# Patient Record
Sex: Female | Born: 1945 | Race: White | Hispanic: No | Marital: Married | Smoking: Current some day smoker
Health system: Southern US, Community
[De-identification: ages and names within clinical notes are randomized; demographics above are authoritative.]

## PROBLEM LIST (undated history)

## (undated) DIAGNOSIS — F17200 Nicotine dependence, unspecified, uncomplicated: Secondary | ICD-10-CM

## (undated) DIAGNOSIS — M1710 Unilateral primary osteoarthritis, unspecified knee: Secondary | ICD-10-CM

## (undated) DIAGNOSIS — N289 Disorder of kidney and ureter, unspecified: Secondary | ICD-10-CM

## (undated) DIAGNOSIS — R7302 Impaired glucose tolerance (oral): Secondary | ICD-10-CM

## (undated) DIAGNOSIS — N318 Other neuromuscular dysfunction of bladder: Secondary | ICD-10-CM

## (undated) DIAGNOSIS — J309 Allergic rhinitis, unspecified: Secondary | ICD-10-CM

## (undated) DIAGNOSIS — E559 Vitamin D deficiency, unspecified: Secondary | ICD-10-CM

## (undated) DIAGNOSIS — E785 Hyperlipidemia, unspecified: Secondary | ICD-10-CM

## (undated) DIAGNOSIS — F329 Major depressive disorder, single episode, unspecified: Secondary | ICD-10-CM

## (undated) DIAGNOSIS — J45909 Unspecified asthma, uncomplicated: Secondary | ICD-10-CM

## (undated) DIAGNOSIS — F411 Generalized anxiety disorder: Secondary | ICD-10-CM

## (undated) DIAGNOSIS — I10 Essential (primary) hypertension: Secondary | ICD-10-CM

## (undated) HISTORY — DX: Unspecified asthma, uncomplicated: J45.909

## (undated) HISTORY — DX: Impaired glucose tolerance (oral): R73.02

## (undated) HISTORY — DX: Nicotine dependence, unspecified, uncomplicated: F17.200

## (undated) HISTORY — DX: Allergic rhinitis, unspecified: J30.9

## (undated) HISTORY — PX: CATARACT EXTRACTION: SUR2

## (undated) HISTORY — DX: Essential (primary) hypertension: I10

## (undated) HISTORY — DX: Vitamin D deficiency, unspecified: E55.9

## (undated) HISTORY — PX: CHOLECYSTECTOMY: SHX55

## (undated) HISTORY — DX: Generalized anxiety disorder: F41.1

## (undated) HISTORY — DX: Unilateral primary osteoarthritis, unspecified knee: M17.10

## (undated) HISTORY — PX: OOPHORECTOMY: SHX86

## (undated) HISTORY — DX: Hyperlipidemia, unspecified: E78.5

## (undated) HISTORY — PX: TONSILLECTOMY: SUR1361

## (undated) HISTORY — DX: Other neuromuscular dysfunction of bladder: N31.8

## (undated) HISTORY — DX: Major depressive disorder, single episode, unspecified: F32.9

## (undated) HISTORY — PX: ABDOMINAL HYSTERECTOMY: SHX81

---

## 1970-08-09 HISTORY — PX: BREAST BIOPSY: SHX20

## 2003-08-19 ENCOUNTER — Other Ambulatory Visit: Admission: RE | Admit: 2003-08-19 | Discharge: 2003-08-19 | Payer: Self-pay | Admitting: *Deleted

## 2004-05-11 ENCOUNTER — Encounter: Admission: RE | Admit: 2004-05-11 | Discharge: 2004-05-11 | Payer: Self-pay | Admitting: Sports Medicine

## 2004-05-25 ENCOUNTER — Encounter: Admission: RE | Admit: 2004-05-25 | Discharge: 2004-05-25 | Payer: Self-pay | Admitting: Sports Medicine

## 2004-06-17 ENCOUNTER — Encounter: Admission: RE | Admit: 2004-06-17 | Discharge: 2004-06-17 | Payer: Self-pay | Admitting: Sports Medicine

## 2004-07-24 ENCOUNTER — Ambulatory Visit: Payer: Self-pay

## 2004-07-24 ENCOUNTER — Ambulatory Visit: Payer: Self-pay | Admitting: Internal Medicine

## 2005-03-02 ENCOUNTER — Ambulatory Visit: Payer: Self-pay | Admitting: Internal Medicine

## 2005-04-01 ENCOUNTER — Ambulatory Visit: Payer: Self-pay | Admitting: Internal Medicine

## 2005-04-13 ENCOUNTER — Encounter: Admission: RE | Admit: 2005-04-13 | Discharge: 2005-04-13 | Payer: Self-pay | Admitting: Internal Medicine

## 2005-08-19 ENCOUNTER — Encounter: Admission: RE | Admit: 2005-08-19 | Discharge: 2005-08-19 | Payer: Self-pay | Admitting: Sports Medicine

## 2005-09-03 ENCOUNTER — Encounter: Admission: RE | Admit: 2005-09-03 | Discharge: 2005-09-03 | Payer: Self-pay | Admitting: Sports Medicine

## 2006-06-07 ENCOUNTER — Encounter: Admission: RE | Admit: 2006-06-07 | Discharge: 2006-06-07 | Payer: Self-pay | Admitting: Internal Medicine

## 2006-09-27 ENCOUNTER — Ambulatory Visit: Payer: Self-pay | Admitting: Internal Medicine

## 2006-09-27 LAB — CONVERTED CEMR LAB
ALT: 23 units/L (ref 0–40)
AST: 38 units/L — ABNORMAL HIGH (ref 0–37)
Albumin: 3.8 g/dL (ref 3.5–5.2)
Alkaline Phosphatase: 64 units/L (ref 39–117)
BUN: 13 mg/dL (ref 6–23)
Basophils Absolute: 0.1 10*3/uL (ref 0.0–0.1)
Basophils Relative: 0.6 % (ref 0.0–1.0)
Bilirubin Urine: NEGATIVE
Bilirubin, Direct: 0.6 mg/dL — ABNORMAL HIGH (ref 0.0–0.3)
CO2: 28 meq/L (ref 19–32)
Calcium: 9.4 mg/dL (ref 8.4–10.5)
Chloride: 102 meq/L (ref 96–112)
Cholesterol: 177 mg/dL (ref 0–200)
Creatinine, Ser: 0.8 mg/dL (ref 0.4–1.2)
Eosinophils Absolute: 0.4 10*3/uL (ref 0.0–0.6)
Eosinophils Relative: 3.7 % (ref 0.0–5.0)
GFR calc Af Amer: 93 mL/min
GFR calc non Af Amer: 77 mL/min
Glucose, Bld: 109 mg/dL — ABNORMAL HIGH (ref 70–99)
HCT: 40.4 % (ref 36.0–46.0)
HDL: 54.5 mg/dL (ref >39.0)
Hemoglobin, Urine: NEGATIVE
Hemoglobin: 13.8 g/dL (ref 12.0–15.0)
Ketones, ur: NEGATIVE mg/dL
LDL Cholesterol: 90 mg/dL (ref 0–99)
Leukocytes, UA: NEGATIVE
Lymphocytes Relative: 31.8 % (ref 12.0–46.0)
MCHC: 34.2 g/dL (ref 30.0–36.0)
MCV: 90.7 fL (ref 78.0–100.0)
Monocytes Absolute: 0.7 10*3/uL (ref 0.2–0.7)
Monocytes Relative: 6.9 % (ref 3.0–11.0)
Neutro Abs: 5.3 10*3/uL (ref 1.4–7.7)
Neutrophils Relative %: 57 % (ref 43.0–77.0)
Nitrite: NEGATIVE
Platelets: 183 10*3/uL (ref 150–400)
Potassium: 4.6 meq/L (ref 3.5–5.1)
RBC: 4.46 M/uL (ref 3.87–5.11)
RDW: 12.4 % (ref 11.5–14.6)
Sodium: 138 meq/L (ref 135–145)
Specific Gravity, Urine: >=1.03 (ref 1.000–1.03)
TSH: 1.31 microintl units/mL (ref 0.35–5.50)
Total Bilirubin: 1.6 mg/dL — ABNORMAL HIGH (ref 0.3–1.2)
Total CHOL/HDL Ratio: 3.2
Total Protein, Urine: NEGATIVE mg/dL
Total Protein: 6.6 g/dL (ref 6.0–8.3)
Triglycerides: 165 mg/dL — ABNORMAL HIGH (ref 0–149)
Urine Glucose: NEGATIVE mg/dL
Urobilinogen, UA: 0.2 (ref 0.0–1.0)
VLDL: 33 mg/dL (ref 0–40)
WBC: 9.5 10*3/uL (ref 4.5–10.5)
pH: 6 (ref 5.0–8.0)

## 2006-10-05 ENCOUNTER — Ambulatory Visit: Payer: Self-pay | Admitting: Internal Medicine

## 2006-10-05 ENCOUNTER — Ambulatory Visit: Payer: Self-pay | Admitting: Family Medicine

## 2007-06-27 ENCOUNTER — Encounter: Admission: RE | Admit: 2007-06-27 | Discharge: 2007-06-27 | Payer: Self-pay | Admitting: Internal Medicine

## 2007-09-12 ENCOUNTER — Ambulatory Visit: Payer: Self-pay | Admitting: Internal Medicine

## 2007-09-12 LAB — CONVERTED CEMR LAB
ALT: 27 units/L (ref 0–35)
AST: 24 units/L (ref 0–37)
Albumin: 3.9 g/dL (ref 3.5–5.2)
Alkaline Phosphatase: 69 units/L (ref 39–117)
BUN: 16 mg/dL (ref 6–23)
Basophils Absolute: 0.1 10*3/uL (ref 0.0–0.1)
Basophils Relative: 0.6 % (ref 0.0–1.0)
Bilirubin Urine: NEGATIVE
Bilirubin, Direct: 0.2 mg/dL (ref 0.0–0.3)
CO2: 28 meq/L (ref 19–32)
Calcium: 9.4 mg/dL (ref 8.4–10.5)
Chloride: 102 meq/L (ref 96–112)
Cholesterol: 166 mg/dL (ref 0–200)
Creatinine, Ser: 1 mg/dL (ref 0.4–1.2)
Eosinophils Absolute: 0.4 10*3/uL (ref 0.0–0.6)
Eosinophils Relative: 4 % (ref 0.0–5.0)
GFR calc Af Amer: 72 mL/min
GFR calc non Af Amer: 60 mL/min
Glucose, Bld: 105 mg/dL — ABNORMAL HIGH (ref 70–99)
HCT: 40.6 % (ref 36.0–46.0)
HDL: 59 mg/dL (ref >39.0)
Hemoglobin, Urine: NEGATIVE
Hemoglobin: 13.9 g/dL (ref 12.0–15.0)
Ketones, ur: NEGATIVE mg/dL
LDL Cholesterol: 81 mg/dL (ref 0–99)
Leukocytes, UA: NEGATIVE
Lymphocytes Relative: 36.1 % (ref 12.0–46.0)
MCHC: 34.2 g/dL (ref 30.0–36.0)
MCV: 90.1 fL (ref 78.0–100.0)
Monocytes Absolute: 0.6 10*3/uL (ref 0.2–0.7)
Monocytes Relative: 6.1 % (ref 3.0–11.0)
Neutro Abs: 5.6 10*3/uL (ref 1.4–7.7)
Neutrophils Relative %: 53.2 % (ref 43.0–77.0)
Nitrite: NEGATIVE
Platelets: 133 10*3/uL — ABNORMAL LOW (ref 150–400)
Potassium: 3.9 meq/L (ref 3.5–5.1)
RBC: 4.51 M/uL (ref 3.87–5.11)
RDW: 12.4 % (ref 11.5–14.6)
Sodium: 139 meq/L (ref 135–145)
Specific Gravity, Urine: 1.02 (ref 1.000–1.03)
TSH: 1.16 microintl units/mL (ref 0.35–5.50)
Total Bilirubin: 1.1 mg/dL (ref 0.3–1.2)
Total CHOL/HDL Ratio: 2.8
Total Protein, Urine: NEGATIVE mg/dL
Total Protein: 6.8 g/dL (ref 6.0–8.3)
Triglycerides: 128 mg/dL (ref 0–149)
Urine Glucose: NEGATIVE mg/dL
Urobilinogen, UA: 0.2 (ref 0.0–1.0)
VLDL: 26 mg/dL (ref 0–40)
WBC: 10.5 10*3/uL (ref 4.5–10.5)
pH: 5.5 (ref 5.0–8.0)

## 2007-09-22 ENCOUNTER — Ambulatory Visit: Payer: Self-pay | Admitting: Internal Medicine

## 2007-09-22 DIAGNOSIS — H6500 Acute serous otitis media, unspecified ear: Secondary | ICD-10-CM | POA: Insufficient documentation

## 2007-09-22 DIAGNOSIS — J309 Allergic rhinitis, unspecified: Secondary | ICD-10-CM

## 2007-09-22 DIAGNOSIS — F3289 Other specified depressive episodes: Secondary | ICD-10-CM

## 2007-09-22 DIAGNOSIS — I10 Essential (primary) hypertension: Secondary | ICD-10-CM

## 2007-09-22 DIAGNOSIS — N318 Other neuromuscular dysfunction of bladder: Secondary | ICD-10-CM | POA: Insufficient documentation

## 2007-09-22 DIAGNOSIS — E785 Hyperlipidemia, unspecified: Secondary | ICD-10-CM

## 2007-09-22 DIAGNOSIS — F411 Generalized anxiety disorder: Secondary | ICD-10-CM

## 2007-09-22 DIAGNOSIS — F329 Major depressive disorder, single episode, unspecified: Secondary | ICD-10-CM

## 2007-09-22 HISTORY — DX: Generalized anxiety disorder: F41.1

## 2007-09-22 HISTORY — DX: Allergic rhinitis, unspecified: J30.9

## 2007-09-22 HISTORY — DX: Major depressive disorder, single episode, unspecified: F32.9

## 2007-09-22 HISTORY — DX: Other specified depressive episodes: F32.89

## 2007-09-22 HISTORY — DX: Other neuromuscular dysfunction of bladder: N31.8

## 2007-09-22 HISTORY — DX: Hyperlipidemia, unspecified: E78.5

## 2007-09-22 HISTORY — DX: Essential (primary) hypertension: I10

## 2008-02-13 ENCOUNTER — Encounter: Payer: Self-pay | Admitting: Internal Medicine

## 2008-04-30 ENCOUNTER — Telehealth (INDEPENDENT_AMBULATORY_CARE_PROVIDER_SITE_OTHER): Payer: Self-pay | Admitting: *Deleted

## 2008-08-16 ENCOUNTER — Encounter: Admission: RE | Admit: 2008-08-16 | Discharge: 2008-08-16 | Payer: Self-pay | Admitting: Internal Medicine

## 2008-10-10 ENCOUNTER — Ambulatory Visit: Payer: Self-pay | Admitting: Endocrinology

## 2008-11-08 ENCOUNTER — Telehealth (INDEPENDENT_AMBULATORY_CARE_PROVIDER_SITE_OTHER): Payer: Self-pay | Admitting: *Deleted

## 2008-11-13 ENCOUNTER — Ambulatory Visit: Payer: Self-pay | Admitting: Internal Medicine

## 2008-11-13 LAB — CONVERTED CEMR LAB
ALT: 27 units/L (ref 0–35)
AST: 25 units/L (ref 0–37)
Albumin: 4 g/dL (ref 3.5–5.2)
Alkaline Phosphatase: 57 units/L (ref 39–117)
BUN: 15 mg/dL (ref 6–23)
Basophils Absolute: 0 10*3/uL (ref 0.0–0.1)
Basophils Relative: 0.4 % (ref 0.0–3.0)
Bilirubin Urine: NEGATIVE
Bilirubin, Direct: 0.2 mg/dL (ref 0.0–0.3)
CO2: 28 meq/L (ref 19–32)
Calcium: 9.4 mg/dL (ref 8.4–10.5)
Chloride: 107 meq/L (ref 96–112)
Cholesterol: 178 mg/dL (ref 0–200)
Creatinine, Ser: 0.7 mg/dL (ref 0.4–1.2)
Eosinophils Absolute: 0.4 10*3/uL (ref 0.0–0.7)
Eosinophils Relative: 4.7 % (ref 0.0–5.0)
GFR calc non Af Amer: 89.38 mL/min (ref >60)
Glucose, Bld: 104 mg/dL — ABNORMAL HIGH (ref 70–99)
HCT: 39.2 % (ref 36.0–46.0)
HDL: 56.9 mg/dL (ref >39.00)
Hemoglobin, Urine: NEGATIVE
Hemoglobin: 13.7 g/dL (ref 12.0–15.0)
Ketones, ur: NEGATIVE mg/dL
LDL Cholesterol: 89 mg/dL (ref 0–99)
Leukocytes, UA: NEGATIVE
Lymphocytes Relative: 39.4 % (ref 12.0–46.0)
Lymphs Abs: 3.4 10*3/uL (ref 0.7–4.0)
MCHC: 34.8 g/dL (ref 30.0–36.0)
MCV: 91.3 fL (ref 78.0–100.0)
Monocytes Absolute: 0.7 10*3/uL (ref 0.1–1.0)
Monocytes Relative: 8.3 % (ref 3.0–12.0)
Neutro Abs: 4.2 10*3/uL (ref 1.4–7.7)
Neutrophils Relative %: 47.2 % (ref 43.0–77.0)
Nitrite: NEGATIVE
Platelets: 140 10*3/uL — ABNORMAL LOW (ref 150.0–400.0)
Potassium: 4 meq/L (ref 3.5–5.1)
RBC: 4.3 M/uL (ref 3.87–5.11)
RDW: 12.5 % (ref 11.5–14.6)
Sodium: 145 meq/L (ref 135–145)
Specific Gravity, Urine: 1.02 (ref 1.000–1.030)
TSH: 1.23 microintl units/mL (ref 0.35–5.50)
Total Bilirubin: 1.2 mg/dL (ref 0.3–1.2)
Total CHOL/HDL Ratio: 3
Total Protein, Urine: NEGATIVE mg/dL
Total Protein: 6.8 g/dL (ref 6.0–8.3)
Triglycerides: 159 mg/dL — ABNORMAL HIGH (ref 0.0–149.0)
Urine Glucose: NEGATIVE mg/dL
Urobilinogen, UA: 0.2 (ref 0.0–1.0)
VLDL: 31.8 mg/dL (ref 0.0–40.0)
WBC: 8.7 10*3/uL (ref 4.5–10.5)
pH: 5.5 (ref 5.0–8.0)

## 2008-11-21 ENCOUNTER — Ambulatory Visit: Payer: Self-pay | Admitting: Internal Medicine

## 2008-11-21 DIAGNOSIS — N951 Menopausal and female climacteric states: Secondary | ICD-10-CM | POA: Insufficient documentation

## 2008-11-21 DIAGNOSIS — IMO0002 Reserved for concepts with insufficient information to code with codable children: Secondary | ICD-10-CM

## 2008-11-21 DIAGNOSIS — M1710 Unilateral primary osteoarthritis, unspecified knee: Secondary | ICD-10-CM

## 2008-11-21 DIAGNOSIS — H9319 Tinnitus, unspecified ear: Secondary | ICD-10-CM | POA: Insufficient documentation

## 2008-11-21 HISTORY — DX: Reserved for concepts with insufficient information to code with codable children: IMO0002

## 2008-11-27 ENCOUNTER — Ambulatory Visit: Payer: Self-pay | Admitting: Internal Medicine

## 2009-04-07 ENCOUNTER — Telehealth: Payer: Self-pay | Admitting: Internal Medicine

## 2009-05-12 ENCOUNTER — Ambulatory Visit: Payer: Self-pay | Admitting: Internal Medicine

## 2009-05-12 DIAGNOSIS — J019 Acute sinusitis, unspecified: Secondary | ICD-10-CM

## 2009-05-26 ENCOUNTER — Telehealth: Payer: Self-pay | Admitting: Internal Medicine

## 2009-06-12 ENCOUNTER — Telehealth: Payer: Self-pay | Admitting: Internal Medicine

## 2009-10-02 ENCOUNTER — Telehealth: Payer: Self-pay | Admitting: Internal Medicine

## 2009-10-03 ENCOUNTER — Ambulatory Visit: Payer: Self-pay | Admitting: Internal Medicine

## 2009-10-09 ENCOUNTER — Telehealth: Payer: Self-pay | Admitting: Internal Medicine

## 2009-11-05 ENCOUNTER — Telehealth: Payer: Self-pay | Admitting: Internal Medicine

## 2009-12-03 ENCOUNTER — Ambulatory Visit: Payer: Self-pay | Admitting: Internal Medicine

## 2009-12-03 DIAGNOSIS — R5381 Other malaise: Secondary | ICD-10-CM

## 2009-12-03 DIAGNOSIS — R5383 Other fatigue: Secondary | ICD-10-CM

## 2010-03-02 ENCOUNTER — Telehealth: Payer: Self-pay | Admitting: Internal Medicine

## 2010-03-03 ENCOUNTER — Telehealth: Payer: Self-pay | Admitting: Internal Medicine

## 2010-04-02 ENCOUNTER — Ambulatory Visit: Payer: Self-pay | Admitting: Internal Medicine

## 2010-04-22 ENCOUNTER — Telehealth: Payer: Self-pay | Admitting: Internal Medicine

## 2010-06-04 ENCOUNTER — Ambulatory Visit: Payer: Self-pay | Admitting: Internal Medicine

## 2010-06-04 DIAGNOSIS — E559 Vitamin D deficiency, unspecified: Secondary | ICD-10-CM

## 2010-06-04 HISTORY — DX: Vitamin D deficiency, unspecified: E55.9

## 2010-07-14 ENCOUNTER — Telehealth: Payer: Self-pay | Admitting: Internal Medicine

## 2010-08-30 ENCOUNTER — Encounter: Payer: Self-pay | Admitting: Sports Medicine

## 2010-09-06 LAB — CONVERTED CEMR LAB
ALT: 26 units/L (ref 0–35)
AST: 23 units/L (ref 0–37)
Albumin: 4.3 g/dL (ref 3.5–5.2)
Alkaline Phosphatase: 62 units/L (ref 39–117)
BUN: 17 mg/dL (ref 6–23)
Basophils Absolute: 0 10*3/uL (ref 0.0–0.1)
Basophils Relative: 0.4 % (ref 0.0–3.0)
Bilirubin Urine: NEGATIVE
Bilirubin, Direct: 0.2 mg/dL (ref 0.0–0.3)
CO2: 27 meq/L (ref 19–32)
Calcium: 9.5 mg/dL (ref 8.4–10.5)
Chloride: 105 meq/L (ref 96–112)
Cholesterol: 181 mg/dL (ref 0–200)
Creatinine, Ser: 0.8 mg/dL (ref 0.4–1.2)
Eosinophils Absolute: 0.2 10*3/uL (ref 0.0–0.7)
Eosinophils Relative: 2.9 % (ref 0.0–5.0)
Folate: 7.9 ng/mL
GFR calc non Af Amer: 76.37 mL/min (ref >60)
Glucose, Bld: 104 mg/dL — ABNORMAL HIGH (ref 70–99)
HCT: 41.2 % (ref 36.0–46.0)
HDL: 60.7 mg/dL (ref >39.00)
Hemoglobin, Urine: NEGATIVE
Hemoglobin: 14 g/dL (ref 12.0–15.0)
Iron: 111 ug/dL (ref 42–145)
Ketones, ur: NEGATIVE mg/dL
LDL Cholesterol: 98 mg/dL (ref 0–99)
Leukocytes, UA: NEGATIVE
Lymphocytes Relative: 27.9 % (ref 12.0–46.0)
Lymphs Abs: 2.3 10*3/uL (ref 0.7–4.0)
MCHC: 34 g/dL (ref 30.0–36.0)
MCV: 91.4 fL (ref 78.0–100.0)
Monocytes Absolute: 0.4 10*3/uL (ref 0.1–1.0)
Monocytes Relative: 5.3 % (ref 3.0–12.0)
Neutro Abs: 5.2 10*3/uL (ref 1.4–7.7)
Neutrophils Relative %: 63.5 % (ref 43.0–77.0)
Nitrite: NEGATIVE
Platelets: 134 10*3/uL — ABNORMAL LOW (ref 150.0–400.0)
Potassium: 3.9 meq/L (ref 3.5–5.1)
RBC: 4.51 M/uL (ref 3.87–5.11)
RDW: 13.8 % (ref 11.5–14.6)
Saturation Ratios: 28.2 % (ref 20.0–50.0)
Sed Rate: 10 mm/hr (ref 0–22)
Sodium: 141 meq/L (ref 135–145)
Specific Gravity, Urine: 1.02 (ref 1.000–1.030)
TSH: 1.76 microintl units/mL (ref 0.35–5.50)
Total Bilirubin: 1.1 mg/dL (ref 0.3–1.2)
Total CHOL/HDL Ratio: 3
Total Protein, Urine: NEGATIVE mg/dL
Total Protein: 6.9 g/dL (ref 6.0–8.3)
Transferrin: 281.5 mg/dL (ref 212.0–360.0)
Triglycerides: 114 mg/dL (ref 0.0–149.0)
Urine Glucose: NEGATIVE mg/dL
Urobilinogen, UA: 0.2 (ref 0.0–1.0)
VLDL: 22.8 mg/dL (ref 0.0–40.0)
Vit D, 25-Hydroxy: 10 ng/mL — ABNORMAL LOW (ref 30–89)
Vitamin B-12: 276 pg/mL (ref 211–911)
WBC: 8.1 10*3/uL (ref 4.5–10.5)
pH: 5.5 (ref 5.0–8.0)

## 2010-09-08 NOTE — Progress Notes (Signed)
Summary: OTC recomendation  Phone Note Call from Patient Call back at Home Phone [phone]   Caller: Patient Summary of Call: Pt called stating that she is still having sinus pressure and post-nasal drip. Pt is requesting recommendation on OTC medication to help. Pt have finished course of ABX. Initial call taken by: Margaret Pyle, CMA,  April 22, 2010 10:24 AM  Follow-up for Phone Call        ok for mucinex OTC BID prn and allegra OTC once daily as needed  Follow-up by: Corwin Levins MD,  April 22, 2010 1:15 PM  Additional Follow-up for Phone Call Additional follow up Details #1::        notified pt with md response Additional Follow-up by: Orlan Leavens RMA,  April 22, 2010 1:39 PM

## 2010-09-08 NOTE — Progress Notes (Signed)
  Phone Note Refill Request  on October 02, 2009 9:38 AM  Refills Requested: Medication #1:  CHOLESTYRAMINE 4 GM  PACK 1 packet bid   Dosage confirmed as above?Dosage Confirmed   Notes: [facility] Initial call taken by: Scharlene Gloss,  October 02, 2009 9:38 AM    Prescriptions: CHOLESTYRAMINE 4 GM  PACK (CHOLESTYRAMINE) 1 packet bid  #60 Packet x 5   Entered by:   Scharlene Gloss   Authorized by:   Corwin Levins MD   Signed by:   Scharlene Gloss on 10/02/2009   Method used:   Faxed to ...       [facility] [address] (retail)       [address] [place], [place]  [address]       Ph: [phone]       Fax: [phone]   RxID:   [id number]

## 2010-09-08 NOTE — Assessment & Plan Note (Signed)
Summary: f/u appt/#/cd   Vital Signs:  Patient profile:   65 year old female Height:      69 inches Weight:      254.38 pounds BMI:     37.70 O2 Sat:      97 % on Room air Temp:     98.8 degrees F oral Pulse rate:   96 / minute BP sitting:   162 / 88  (left arm) Cuff size:   large  Vitals Entered By: Zella Ball Ewing CMA (AAMA) (June 04, 2010 10:10 AM)  O2 Flow:  Room air  Preventive Care Screening     decliens flu shot and colonosccopy  CC: followup, sinus pressure/RE   CC:  followup and sinus pressure/RE.  History of Present Illness: here to f/u ; from last visit did not take the vit d or fenofibrate - too expensive, nor the cetirizine, though the flonase works but has been out;  c/o new onset sinus pain and pressure with fever, greenish d/c and mld cough for 2-3 days, as well as bilat ear fullness and popping, on top of weeks of sinus and nasal allergy symptoms year round but worse in the fall c/w  perennial and seasonal allergies;  Pt denies CP, worsening sob, doe, wheezing, orthopnea, pnd, worsening LE edema, palps, dizziness or syncope  Pt denies new neuro symptoms such as headache, facial or extremity weakness  Pt denies polydipsia, polyuria  Overall good compliance with meds, trying to follow low chol diet, wt stable, little excercise however  Takes BP daily at home  - ave approx 130/79.    Problems Prior to Update: 1)  Sinusitis- Acute-nos  (ICD-461.9) 2)  Vitamin D Deficiency  (ICD-268.9) 3)  Fatigue  (ICD-780.79) 4)  Sinusitis- Acute-nos  (ICD-461.9) 5)  Sinusitis- Acute-nos  (ICD-461.9) 6)  Menopausal Syndrome  (ICD-627.2) 7)  Osteoarthritis, Knee  (ICD-715.96) 8)  Tinnitus  (ICD-388.30) 9)  Preventive Health Care  (ICD-V70.0) 10)  Otitis Media, Serous, Acute, Left  (ICD-381.01) 11)  Preventive Health Care  (ICD-V70.0) 12)  Allergic Rhinitis  (ICD-477.9) 13)  Overactive Bladder  (ICD-596.51) 14)  Depression  (ICD-311) 15)  Anxiety  (ICD-300.00) 16)   Hypertension  (ICD-401.9) 17)  Hyperlipidemia  (ICD-272.4) 18)  Routine General Medical Exam@health  Care Facl  (ICD-V70.0)  Medications Prior to Update: 1)  Proair Hfa 108 (90 Base) Mcg/act  Aers (Albuterol Sulfate) .... 2 Puffs Qid As Needed 2)  Clonazepam 0.5 Mg  Tabs (Clonazepam) .Marland Kitchen.. 1 By Mouth Two Times A Day As Needed 3)  Losartan Potassium-Hctz 100-25 Mg Tabs (Losartan Potassium-Hctz) .Marland Kitchen.. 1 By Mouth Once Daily 4)  Ecotrin Low Strength 81 Mg  Tbec (Aspirin) .Marland Kitchen.. 1po Qd 5)  Fenofibrate 160 Mg Tabs (Fenofibrate) .Marland Kitchen.. 1po Once Daily 6)  Percocet 10-325 Mg Tabs (Oxycodone-Acetaminophen) .Marland Kitchen.. 1 By Mouth Three Times A Day As Needed For Knee Pain 7)  Cetirizine Hcl 10 Mg Tabs (Cetirizine Hcl) .Marland Kitchen.. 1 By Mouth Once Daily As Needed 8)  Fluticasone Propionate 50 Mcg/act Susp (Fluticasone Propionate) .... 2 Spray/side Per Once Daily 9)  Sertraline Hcl 100 Mg Tabs (Sertraline Hcl) .Marland Kitchen.. 1po Once Daily 10)  Singulair 10 Mg Tabs (Montelukast Sodium) .Marland Kitchen.. 1 By Mouth Once Daily 11)  Simvastatin 40 Mg Tabs (Simvastatin) .Marland Kitchen.. 1 By Mouth Once Daily 12)  Amoxicillin-Pot Clavulanate 875-125 Mg Tabs (Amoxicillin-Pot Clavulanate) .Marland Kitchen.. 1po Two Times A Day  Current Medications (verified): 1)  Proair Hfa 108 (90 Base) Mcg/act  Aers (Albuterol Sulfate) .... 2 Puffs Qid As  Needed 2)  Clonazepam 0.5 Mg  Tabs (Clonazepam) .Marland Kitchen.. 1 By Mouth Two Times A Day As Needed 3)  Losartan Potassium-Hctz 100-25 Mg Tabs (Losartan Potassium-Hctz) .Marland Kitchen.. 1 By Mouth Once Daily 4)  Ecotrin Low Strength 81 Mg  Tbec (Aspirin) .Marland Kitchen.. 1po Qd 5)  Percocet 10-325 Mg Tabs (Oxycodone-Acetaminophen) .Marland Kitchen.. 1 By Mouth Three Times A Day As Needed For Knee Pain 6)  Levocetirizine Dihydrochloride 5 Mg Tabs (Levocetirizine Dihydrochloride) .Marland Kitchen.. 1 By Mouth Once Daily 7)  Fluticasone Propionate 50 Mcg/act Susp (Fluticasone Propionate) .... 2 Spray/side Per Once Daily 8)  Sertraline Hcl 100 Mg Tabs (Sertraline Hcl) .Marland Kitchen.. 1po Once Daily 9)  Singulair 10 Mg  Tabs (Montelukast Sodium) .Marland Kitchen.. 1 By Mouth Once Daily 10)  Simvastatin 40 Mg Tabs (Simvastatin) .Marland Kitchen.. 1 By Mouth Once Daily 11)  Levofloxacin 500 Mg Tabs (Levofloxacin) .Marland Kitchen.. 1po Once Daily 12)  Prednisone 10 Mg Tabs (Prednisone) .... 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 Cataract extraction - right eye Hysterectomy Oophorectomy Cholecystectomy Tonsillectomy s/p breast bx 1972 - neg  Review of Systems       all otherwise negative per pt -    Physical Exam  General:  alert and overweight-appearing.  , mild ill  Head:  normocephalic and atraumatic.   Eyes:  vision grossly intact, pupils equal, and pupils round.   Ears:  bilat tm's mild erythema, sinus tender bilat Nose:  nasal dischargemucosal pallor and mucosal edema.   Mouth:  pharyngeal erythema and fair dentition.   Neck:  supple and no masses.   Lungs:  normal respiratory effort and normal breath sounds.   Heart:  normal rate and regular rhythm.   Extremities:  no edema, no erythema    Impression & Recommendations:  Problem # 1:  SINUSITIS- ACUTE-NOS (ICD-461.9)  Her updated medication list for this problem includes:    Fluticasone Propionate 50 Mcg/act Susp (Fluticasone propionate) .Marland Kitchen... 2 spray/side per once daily    Levofloxacin 500 Mg Tabs (Levofloxacin) .Marland Kitchen... 1po once daily  Orders: Depo- Medrol 40mg  (J1030) Depo- Medrol 80mg   (J1040) Admin of Therapeutic Inj  intramuscular or subcutaneous ([address]) treat as above, f/u any worsening signs or symptoms   Problem # 2:  ALLERGIC RHINITIS (ICD-477.9)  Her updated medication list for this problem includes:    Levocetirizine Dihydrochloride 5 Mg Tabs (Levocetirizine dihydrochloride) .Marland Kitchen... 1 by mouth once daily    Fluticasone Propionate 50 Mcg/act Susp (Fluticasone propionate) .Marland Kitchen... 2 spray/side per once daily for depomedrol IM today, predpack for home for uncontrolled symptoms  Problem # 3:  VITAMIN D DEFICIENCY (ICD-268.9) for 2000 units per day  Problem # 4:  HYPERTENSION (ICD-401.9)  Her updated medication list for this problem includes:    Losartan Potassium-hctz 100-25 Mg Tabs (Losartan potassium-hctz) .Marland Kitchen... 1 by mouth once daily  BP today: 162/88 Prior BP: 140/92 (04/02/2010)  Labs Reviewed: K+: 3.9 (12/03/2009) Creat: : 0.8 (12/03/2009)   Chol: 181 (12/03/2009)   HDL: 60.70 (12/03/2009)   LDL: 98 (12/03/2009)  TG: 114.0 (12/03/2009) mild elev today, likely situational, ok to follow, continue same treatment   Complete Medication List: 1)  Proair Hfa 108 (90 Base) Mcg/act Aers (Albuterol sulfate) .... 2 puffs qid as needed 2)  Clonazepam 0.5 Mg Tabs (Clonazepam) .Marland Kitchen.. 1 by mouth two times a day as needed 3)  Losartan Potassium-hctz 100-25 Mg Tabs (Losartan potassium-hctz) .Marland Kitchen.. 1 by mouth once daily 4)  Ecotrin Low Strength 81 Mg Tbec (Aspirin) .Marland Kitchen.. 1po qd 5)  Percocet 10-325 Mg Tabs (Oxycodone-acetaminophen) .Marland Kitchen.. 1 by mouth three times a day as needed for knee pain 6)  Levocetirizine Dihydrochloride 5 Mg Tabs (Levocetirizine dihydrochloride) .Marland Kitchen.. 1 by mouth once daily 7)  Fluticasone Propionate 50 Mcg/act Susp (Fluticasone propionate) .... 2 spray/side per once daily 8)  Sertraline Hcl 100 Mg Tabs (Sertraline hcl) .Marland Kitchen.. 1po once daily 9)  Singulair 10 Mg Tabs (Montelukast sodium) .Marland Kitchen.. 1 by mouth once daily 10)  Simvastatin 40 Mg Tabs (Simvastatin) .Marland Kitchen.. 1 by  mouth once daily 11)  Levofloxacin 500 Mg Tabs (Levofloxacin) .Marland Kitchen.. 1po once daily 12)  Prednisone 10 Mg Tabs (Prednisone) .... 3po qd for 3days, then 2po qd for 3days, then 1po qd for 3days, then stop  Other Orders: Pneumococcal Vaccine ([address]) Admin 1st Vaccine ([address])  Patient Instructions: 1)  you had the pneuomnia shot, and the steroid shot today 2)  Please take all new medications as prescribed - the antibiotic, levocetirizine (generic antihistamine), short course of prednisone 3)  Continue all previous medications as before this visit , including the generic flonase, and nettie pott, and the Vit D  at 2000 units per day 4)  You can also use Mucinex OTC or it's generic for congestion  5)  Please avoid decongestant such as sudafed as this can increased the blood pressure 6)  Please call if this does not help enough, for referral to allergist 7)  Please schedule a follow-up appointment in 6 months, or sooner if needed Prescriptions: PREDNISONE 10 MG TABS (PREDNISONE) 3po qd for 3days, then 2po qd for 3days, then 1po qd for 3days, then stop  #18 x 0   Entered and Authorized by:   Corwin Levins MD   Signed by:   Corwin Levins MD on 06/04/2010   Method used:   Print then Give to Patient   RxID:   [id number] LEVOCETIRIZINE DIHYDROCHLORIDE 5 MG TABS (LEVOCETIRIZINE DIHYDROCHLORIDE) 1 by mouth once daily  #30 x 11   Entered and Authorized by:   Corwin Levins MD   Signed by:   Corwin Levins MD on 06/04/2010   Method used:   Print then Give to Patient   RxID:   [id number] FLUTICASONE PROPIONATE 50 MCG/ACT SUSP (FLUTICASONE PROPIONATE) 2 spray/side per once daily  #1 x 11   Entered and Authorized by:   Corwin Levins MD   Signed by:   Corwin Levins MD on 06/04/2010   Method used:   Print then Give to Patient   RxID:   [id number] LEVOFLOXACIN 500 MG TABS (LEVOFLOXACIN) 1po once daily  #10 x 0   Entered and Authorized by:   Corwin Levins MD   Signed by:   Corwin Levins MD on  06/04/2010   Method used:   Print then Give to Patient   RxID:   [id number]    Medication Administration  Injection # 1:    Medication: Depo- Medrol 40mg     Diagnosis: SINUSITIS- ACUTE-NOS (ICD-461.9)    Route: IM  Site: LUOQ gluteus    Exp Date: 11/2012    Lot #: [id number]    Mfr: Pharmacia    Comments: Patient received 120mg  Depo-Medrol    Patient tolerated injection without complications    Given by: Zella Ball Ewing CMA Duncan Dull) (June 04, 2010 10:43 AM)  Injection # 2:    Medication: Depo- Medrol 80mg     Diagnosis: SINUSITIS- ACUTE-NOS (ICD-461.9)    Route: IM    Site: LUOQ gluteus    Exp Date: 11/2012    Lot #: [id number]    Mfr: Pharmacia    Given by: Zella Ball Ewing CMA Duncan Dull) (June 04, 2010 10:43 AM)  Orders Added: 1)  Depo- Medrol 40mg  [J1030] 2)  Depo- Medrol 80mg  [J1040] 3)  Admin of Therapeutic Inj  intramuscular or subcutaneous [[address]] 4)  Pneumococcal Vaccine [90732] 5)  Admin 1st Vaccine [90471] 6)  Est. Patient Level IV [33295]   Immunizations Administered:  Pneumonia Vaccine:    Vaccine Type: Pneumovax    Site: right deltoid    Mfr: Merck    Dose: 0.5 ml    Route: IM    Given by: Zella Ball Ewing CMA (AAMA)    Exp. Date: 10/22/2011    Lot #: [id number]    VIS given: 07/14/09 version given June 04, 2010.   Immunizations Administered:  Pneumonia Vaccine:    Vaccine Type: Pneumovax    Site: right deltoid    Mfr: Merck    Dose: 0.5 ml    Route: IM    Given by: Zella Ball Ewing CMA (AAMA)    Exp. Date: 10/22/2011    Lot #: [id number]    VIS given: 07/14/09 version given June 04, 2010.

## 2010-09-08 NOTE — Progress Notes (Signed)
Summary: medication refill  Phone Note Refill Request Message from:  Fax from Pharmacy on July 14, 2010 8:59 AM  Refills Requested: Medication #1:  CLONAZEPAM 0.5 MG  TABS 1 by mouth two times a day as needed   Dosage confirmed as above?Dosage Confirmed   Last Refilled: 12/03/2009   Notes: [facility] [phone] Initial call taken by: Zella Ball Ewing CMA Duncan Dull),  July 14, 2010 9:00 AM  Follow-up for Phone Call        [facility] @ [phone] Follow-up by: Orlan Leavens RMA,  July 14, 2010 11:26 AM    Prescriptions: CLONAZEPAM 0.5 MG  TABS (CLONAZEPAM) 1 by mouth two times a day as needed  #60 x 5   Entered and Authorized by:   Corwin Levins MD   Signed by:   Corwin Levins MD on 07/14/2010   Method used:   Print then Give to Patient   RxID:   [id number]  done hardcopy to LIM side B - dahlia Corwin Levins MD  July 14, 2010 10:26 AM

## 2010-09-08 NOTE — Assessment & Plan Note (Signed)
Summary: GLANDS HURT/ EAR PAIN/ SINUS/NO FEVER/ NWS  #   Vital Signs:  Patient profile:   65 year old female Height:      69 inches Weight:      254.50 pounds BMI:     37.72 O2 Sat:      97 % on Room air Temp:     98.2 degrees F oral Pulse rate:   81 / minute BP sitting:   160 / 80  (left arm) Cuff size:   large  Vitals Entered ByZella Ball Ewing (October 03, 2009 11:10 AM)  O2 Flow:  Room air CC: Sinus pressure and drainage, ear pain/RE   CC:  Sinus pressure and drainage and ear pain/RE.  History of Present Illness: here with acute onset mild to mod 3 days facial pain, pressure, fever and greenish d/c, with mild St, and Pt denies CP, sob, doe, wheezing, orthopnea, pnd, worsening LE edema, palps, dizziness or syncope .  Has some ear fullness but no hearing loss, vertigo or n/v.    Problems Prior to Update: 1)  Sinusitis- Acute-nos  (ICD-461.9) 2)  Sinusitis- Acute-nos  (ICD-461.9) 3)  Menopausal Syndrome  (ICD-627.2) 4)  Osteoarthritis, Knee  (ICD-715.96) 5)  Tinnitus  (ICD-388.30) 6)  Elevated Blood Pressure Without Diagnosis of Hypertension  (ICD-796.2) 7)  Preventive Health Care  (ICD-V70.0) 8)  Otitis Media, Serous, Acute, Left  (ICD-381.01) 9)  Preventive Health Care  (ICD-V70.0) 10)  Allergic Rhinitis  (ICD-477.9) 11)  Overactive Bladder  (ICD-596.51) 12)  Depression  (ICD-311) 13)  Anxiety  (ICD-300.00) 14)  Hypertension  (ICD-401.9) 15)  Hyperlipidemia  (ICD-272.4) 16)  Routine General Medical Exam@health  Care Facl  (ICD-V70.0)  Medications Prior to Update: 1)  Proair Hfa 108 (90 Base) Mcg/act  Aers (Albuterol Sulfate) .... 2 Puffs Qid Prn 2)  Clonazepam 0.5 Mg  Tabs (Clonazepam) .Marland Kitchen.. 1 By Mouth Two Times A Day As Needed 3)  Lipitor 40 Mg  Tabs (Atorvastatin Calcium) .Marland Kitchen.. 1 By Mouth Once Daily 4)  Losartan Potassium-Hctz 100-25 Mg Tabs (Losartan Potassium-Hctz) .Marland Kitchen.. 1 By Mouth Once Daily 5)  Ecotrin Low Strength 81 Mg  Tbec (Aspirin) .Marland Kitchen.. 1po Qd 6)   Cholestyramine 4 Gm  Pack (Cholestyramine) .Marland Kitchen.. 1 Packet Bid 7)  Percocet 10-325 Mg Tabs (Oxycodone-Acetaminophen) .Marland Kitchen.. 1 By Mouth Three Times A Day As Needed For Knee Pain 8)  Cetirizine Hcl 10 Mg Tabs (Cetirizine Hcl) .Marland Kitchen.. 1 By Mouth Once Daily As Needed 9)  Fluticasone Propionate 50 Mcg/act Susp (Fluticasone Propionate) .... 2 Spray/side Per Once Daily 10)  Azithromycin 250 Mg Tabs (Azithromycin) .... 2po Qd For 1 Day, Then 1po Qd For 4days, Then Stop 11)  Citalopram Hydrobromide 10 Mg Tabs (Citalopram Hydrobromide) .Marland Kitchen.. 1 By Mouth Once Daily  Current Medications (verified): 1)  Proair Hfa 108 (90 Base) Mcg/act  Aers (Albuterol Sulfate) .... 2 Puffs Qid As Needed 2)  Clonazepam 0.5 Mg  Tabs (Clonazepam) .Marland Kitchen.. 1 By Mouth Two Times A Day As Needed 3)  Lipitor 40 Mg  Tabs (Atorvastatin Calcium) .Marland Kitchen.. 1 By Mouth Once Daily 4)  Losartan Potassium-Hctz 100-25 Mg Tabs (Losartan Potassium-Hctz) .Marland Kitchen.. 1 By Mouth Once Daily 5)  Ecotrin Low Strength 81 Mg  Tbec (Aspirin) .Marland Kitchen.. 1po Qd 6)  Colestipol Hcl 1 Gm Tabs (Colestipol Hcl) .Marland Kitchen.. 1po Two Times A Day 7)  Percocet 10-325 Mg Tabs (Oxycodone-Acetaminophen) .Marland Kitchen.. 1 By Mouth Three Times A Day As Needed For Knee Pain 8)  Cetirizine Hcl 10 Mg Tabs (Cetirizine Hcl) .Marland KitchenMarland KitchenMarland Kitchen 1  By Mouth Once Daily As Needed 9)  Fluticasone Propionate 50 Mcg/act Susp (Fluticasone Propionate) .... 2 Spray/side Per Once Daily 10)  Azithromycin 250 Mg Tabs (Azithromycin) .... 2po Qd For 1 Day, Then 1po Qd For 4days, Then Stop 11)  Citalopram Hydrobromide 10 Mg Tabs (Citalopram Hydrobromide) .Marland Kitchen.. 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 , mild ill  Head:  normocephalic and atraumatic.   Eyes:  vision grossly intact, pupils equal, and pupils round.   Ears:  bilat tm's red, sinus tender bilat Nose:  nasal dischargemucosal pallor and mucosal edema.   Mouth:  pharyngeal erythema and fair dentition.   Neck:  supple and no masses.   Lungs:  normal respiratory effort and normal breath sounds.   Heart:  normal rate and regular rhythm.   Extremities:  no edema, no erythema    Impression & Recommendations:  Problem # 1:  SINUSITIS- ACUTE-NOS (ICD-461.9)  Her updated medication list for this problem includes:    Fluticasone Propionate 50 Mcg/act Susp (Fluticasone propionate) .Marland Kitchen... 2 spray/side per once daily    Azithromycin 250 Mg Tabs (Azithromycin) .Marland Kitchen... 2po qd for 1 day, then 1po qd for 4days, then stop treat as above, f/u any worsening signs or symptoms   Problem # 2:  HYPERTENSION (ICD-401.9)  Her updated medication list for this problem includes:    Losartan Potassium-hctz 100-25 Mg Tabs (Losartan potassium-hctz) .Marland Kitchen... 1 by mouth once daily  BP today: 160/80 Prior BP: 198/104 (05/12/2009)  Labs Reviewed: K+: 4.0 (11/13/2008) Creat: : 0.7 (11/13/2008)   Chol: 178 (11/13/2008)   HDL: 56.90 (11/13/2008)   LDL: 89 (11/13/2008)   TG: 159.0 (11/13/2008) overall improved, mild elev today, likely situational, ok to follow, continue same treatment   Complete Medication List: 1)  Proair Hfa 108 (90 Base) Mcg/act Aers (Albuterol sulfate) .... 2 puffs qid as needed 2)  Clonazepam 0.5 Mg Tabs (Clonazepam) .Marland Kitchen.. 1 by mouth two times a day as  needed 3)  Lipitor 40 Mg Tabs (Atorvastatin calcium) .Marland Kitchen.. 1 by mouth once daily 4)  Losartan Potassium-hctz 100-25 Mg Tabs (Losartan potassium-hctz) .Marland Kitchen.. 1 by mouth once daily 5)  Ecotrin Low Strength 81 Mg Tbec (Aspirin) .Marland Kitchen.. 1po qd 6)  Colestipol Hcl 1 Gm Tabs (Colestipol hcl) .Marland Kitchen.. 1po two times a day 7)  Percocet 10-325 Mg Tabs (Oxycodone-acetaminophen) .Marland Kitchen.. 1 by mouth three times a day as needed for knee pain 8)  Cetirizine Hcl 10 Mg Tabs (Cetirizine hcl) .Marland Kitchen.. 1 by mouth once daily as needed 9)  Fluticasone Propionate 50 Mcg/act Susp (Fluticasone propionate) .... 2 spray/side per once daily 10)  Azithromycin 250 Mg Tabs (Azithromycin) .... 2po qd for 1 day, then 1po qd for 4days, then stop 11)  Citalopram Hydrobromide 10 Mg Tabs (Citalopram hydrobromide) .Marland Kitchen.. 1 by mouth once daily  Patient Instructions: 1)  Please take all new medications as prescribed 2)  Continue all previous  medications as before this visit  3)  Please schedule a follow-up appointment in 2 months for "yearly exam" Prescriptions: COLESTIPOL HCL 1 GM TABS (COLESTIPOL HCL) 1po two times a day  #60 x 11   Entered and Authorized by:   Corwin Levins MD   Signed by:   Corwin Levins MD on 10/03/2009   Method used:   Electronically to        [facility] [phone]* (retail)       [address] [place], [place]  [address]       Ph: [phone]       Fax: [phone]   RxID:   [id number] PROAIR HFA 108 (90 BASE) MCG/ACT  AERS (ALBUTEROL SULFATE) 2 puffs qid as needed  #1 x 11   Entered and Authorized by:   Corwin Levins MD   Signed by:   Corwin Levins MD on 10/03/2009   Method used:   Electronically to        [facility] [phone]* (retail)       [address] [place], [place]  [address]       Ph: [phone]       Fax: [phone]   RxID:   [id number] AZITHROMYCIN 250 MG TABS (AZITHROMYCIN) 2po qd for 1 day, then 1po qd for 4days, then stop  #6 x 1   Entered and Authorized by:   Corwin Levins MD   Signed by:   Corwin Levins MD on 10/03/2009   Method used:   Electronically to        [facility] # [phone]* (retail)       [address] [place], [place]  [address]       Ph: [phone]       Fax: [phone]   RxID:   [id number]

## 2010-09-08 NOTE — Assessment & Plan Note (Signed)
Summary: YEARLY FU/ MEDICARE/ TO COME FASTING/NWS   Vital Signs:  Patient profile:   65 year old female Height:      69 inches Weight:      259.75 pounds BMI:     38.50 O2 Sat:      96 % on Room air Temp:     98.2 degrees F oral Pulse rate:   101 / minute BP sitting:   180 / 92  (left arm) Cuff size:   large  Vitals Entered ByZella Ball Ewing (December 03, 2009 8:32 AM)  O2 Flow:  Room air  Preventive Care Screening     declines colonoscopy due to "bad experience" with sigmoid earlier  CC: Yearly followup, Medicare/RE   CC:  Yearly followup and Medicare/RE.  History of Present Illness: BP always < 140/90 and elev at home today - thinks her cuff is accurate;  has significant allergy sinus symtpoms and post nasal grtt despite the nasal steroid and allergy, and trying to avoid the allergist;  has increased stress last few months iwth daugter and granddaughter with marital and drug problems;  husband not working;  pt not sure the stress meds are workikng;  asks about cymbalta b/c others talk about it and daughter is on this too;  Pt denies CP, sob, doe, wheezing, orthopnea, pnd, worsening LE edema, palps, dizziness or syncope  Pt denies new neuro symptoms such as headache, facial or extremity weakness   Here for wellness Diet: Heart Healthy or DM if diabetic Physical Activities: Sedentary due to knees and back arthritis Depression/mood screen: moderate as above Hearing: Intact bilateral with bilat tinnitis, worse with lying down Visual Acuity: Grossly normal, gets exam yearly, reading glasses only ADL's: Capable  Fall Risk: mild in the am due to knees  Home Safety: Good Cognitive Impairment:  Gen appearance, affect, speech, memory, attention & motor skills grossly intact End-of-Life Planning: Advance directive - Full code/I agree   Preventive Screening-Counseling & Management      Drug Use:  no.    Problems Prior to Update: 1)  Fatigue  (ICD-780.79) 2)  Sinusitis- Acute-nos   (ICD-461.9) 3)  Sinusitis- Acute-nos  (ICD-461.9) 4)  Menopausal Syndrome  (ICD-627.2) 5)  Osteoarthritis, Knee  (ICD-715.96) 6)  Tinnitus  (ICD-388.30) 7)  Elevated Blood Pressure Without Diagnosis of Hypertension  (ICD-796.2) 8)  Preventive Health Care  (ICD-V70.0) 9)  Otitis Media, Serous, Acute, Left  (ICD-381.01) 10)  Preventive Health Care  (ICD-V70.0) 11)  Allergic Rhinitis  (ICD-477.9) 12)  Overactive Bladder  (ICD-596.51) 13)  Depression  (ICD-311) 14)  Anxiety  (ICD-300.00) 15)  Hypertension  (ICD-401.9) 16)  Hyperlipidemia  (ICD-272.4) 17)  Routine General Medical Exam@health  Care Fac[facility]  (ICD-V70.0)  Medications Prior to Update: 1)  Proair Hfa 108 (90 Base) Mcg/act  Aers (Albuterol Sulfate) .... 2 Puffs Qid As Needed 2)  Clonazepam 0.5 Mg  Tabs (Clonazepam) .Marland Kitchen.. 1 By Mouth Two Times A Day As Needed 3)  Lipitor 40 Mg  Tabs (Atorvastatin Calcium) .Marland Kitchen.. 1 By Mouth Once Daily 4)  Losartan Potassium-Hctz 100-25 Mg Tabs (Losartan Potassium-Hctz) .Marland Kitchen.. 1 By Mouth Once Daily 5)  Ecotrin Low Strength 81 Mg  Tbec (Aspirin) .Marland Kitchen.. 1po Qd 6)  Colestipol Hcl 5 Gm Gran (Colestipol Hcl) .... 5 Gm By Mouth Two Times A Day 7)  Percocet 10-325 Mg Tabs (Oxycodone-Acetaminophen) .Marland Kitchen.. 1 By Mouth Three Times A Day As Needed For Knee Pain 8)  Cetirizine Hcl 10 Mg Tabs (Cetirizine Hcl) .Marland Kitchen.. 1 By Mouth Once  Daily As Needed 9)  Fluticasone Propionate 50 Mcg/act Susp (Fluticasone Propionate) .... 2 Spray/side Per Once Daily 10)  Cephalexin 500 Mg Caps (Cephalexin) .Marland Kitchen.. 1po Three Times A Day 11)  Citalopram Hydrobromide 10 Mg Tabs (Citalopram Hydrobromide) .Marland Kitchen.. 1 By Mouth Once Daily  Current Medications (verified): 1)  Proair Hfa 108 (90 Base) Mcg/act  Aers (Albuterol Sulfate) .... 2 Puffs Qid As Needed 2)  Clonazepam 0.5 Mg  Tabs (Clonazepam) .Marland Kitchen.. 1 By Mouth Two Times A Day As Needed 3)  Lipitor 40 Mg  Tabs (Atorvastatin Calcium) .Marland Kitchen.. 1 By Mouth Once Daily 4)  Losartan Potassium-Hctz 100-25 Mg Tabs  (Losartan Potassium-Hctz) .Marland Kitchen.. 1 By Mouth Once Daily 5)  Ecotrin Low Strength 81 Mg  Tbec (Aspirin) .Marland Kitchen.. 1po Qd 6)  Colestipol Hcl 5 Gm Gran (Colestipol Hcl) .... 5 Gm By Mouth Two Times A Day 7)  Percocet 10-325 Mg Tabs (Oxycodone-Acetaminophen) .Marland Kitchen.. 1 By Mouth Three Times A Day As Needed For Knee Pain 8)  Cetirizine Hcl 10 Mg Tabs (Cetirizine Hcl) .Marland Kitchen.. 1 By Mouth Once Daily As Needed 9)  Fluticasone Propionate 50 Mcg/act Susp (Fluticasone Propionate) .... 2 Spray/side Per Once Daily 10)  Cymbalta 60 Mg Cpep (Duloxetine Hcl) .Marland Kitchen.. 1po Once Daily 11)  Singulair 10 Mg Tabs (Montelukast Sodium) .Marland Kitchen.. 1 By Mouth Once Daily  Allergies (verified): 1)  Ace Inhibitors  Past History:  Family History: Last updated: 09/22/2007 mother with breast cancer  Social History: Last updated: 12/03/2009 Current Smoker Alcohol use-no Married work - Scientist, physiological 1 daughter [place] Drug use-no  Risk Factors: Smoking Status: current (09/22/2007)  Past Medical History: Hyperlipidemia Hypertension lumbar disc disease - Dr Farris Has Anxiety/panic Depression OAB allergic rhinitis knee DJD - Dr Guy Sandifer knee baker'c cyst by MRI summer 2009  Past Surgical History: Cataract extraction - right eye Hysterectomy Oophorectomy Cholecystectomy Tonsillectomy s/p breast bx 1972 - neg  Social History: Reviewed history from 09/22/2007 and no changes required. 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         all otherwise negative per pt -   - just s/p cortisone to bilat knees 2 mo ago, still with some ongoing fatigue  Physical Exam  General:  alert and overweight-appearing.   Head:  normocephalic and atraumatic.   Eyes:  vision grossly intact, pupils equal, and pupils round.   Ears:  R ear normal and L ear normal.   Nose:  no external deformity and no nasal discharge.   Mouth:  no gingival abnormalities and pharynx pink and moist.   Neck:  supple and no masses.   Lungs:  normal respiratory effort and normal breath sounds.   Heart:  normal rate and regular rhythm.   Abdomen:  soft, non-tender, and normal bowel sounds.   Msk:  no joint tenderness and no joint swelling.   Extremities:  no edema, no erythema  Neurologic:  cranial nerves II-XII intact and strength normal in all extremities.   Psych:  depressed affect and moderately anxious.     Impression & Recommendations:  Problem # 1:  Preventive Health Care (ICD-V70.0)  Overall doing well, age appropriate education  and counseling updated and referral for appropriate preventive services done unless declined, immunizations up to date or declined, diet counseling done if overweight, urged to quit smoking if smokes , most recent labs reviewed and current ordered if appropriate, ecg reviewed or declined (interpretation per ECG scanned in the EMR if done); information regarding Medicare Prevention requirements given if appropriate   Orders: EKG w/ Interpretation (93000) First annual wellness visit with prevention plan  (Z3086)  Problem # 2:  ALLERGIC RHINITIS (ICD-477.9)  Her updated medication list for this problem includes:    Cetirizine Hcl 10 Mg Tabs (Cetirizine hcl) .Marland Kitchen... 1 by mouth once daily as needed    Fluticasone Propionate 50 Mcg/act Susp (Fluticasone propionate) .Marland Kitchen... 2 spray/side per once daily to try add the singulair 10 mg per day  Orders: Prescription Created Electronically [phone])  Problem # 3:   DEPRESSION (ICD-311)  Her updated medication list for this problem includes:    Clonazepam 0.5 Mg Tabs (Clonazepam) .Marland Kitchen... 1 by mouth two times a day as needed    Cymbalta 60 Mg Cpep (Duloxetine hcl) .Marland Kitchen... 1po once daily with anixety - to try to change to the cymbalta, declines counseling  Problem # 4:  HYPERTENSION (ICD-401.9)  Her updated medication list for this problem includes:    Losartan Potassium-hctz 100-25 Mg Tabs (Losartan potassium-hctz) .Marland Kitchen... 1 by mouth once daily  BP today: 180/92 Prior BP: 160/80 (10/03/2009)  Labs Reviewed: K+: 4.0 (11/13/2008) Creat: : 0.7 (11/13/2008)   Chol: 178 (11/13/2008)   HDL: 56.90 (11/13/2008)   LDL: 89 (11/13/2008)   TG: 159.0 (11/13/2008) uncontrolled , but declines further med chages at this time, Continue all previous medications as before this visit ; to cont to monitor BP at home  Problem # 5:  HYPERLIPIDEMIA (ICD-272.4)  Her updated medication list for this problem includes:    Lipitor 40 Mg Tabs (Atorvastatin calcium) .Marland Kitchen... 1 by mouth once daily    Colestipol Hcl 5 Gm Gran (Colestipol hcl) .Marland KitchenMarland KitchenMarland KitchenMarland Kitchen 5 gm by mouth two times a day  Orders: TLB-Lipid Panel (80061-LIPID)  Labs Reviewed: SGOT: 25 (11/13/2008)   SGPT: 27 (11/13/2008)   HDL:56.90 (11/13/2008), 59.0 (09/12/2007)  LDL:89 (11/13/2008), 81 (09/12/2007)  Chol:178 (11/13/2008), 166 (09/12/2007)  Trig:159.0 (11/13/2008), 128 (09/12/2007) stable overall by hx and exam, ok to continue meds/tx as is - Pt to continue diet efforts, good med tolerance; to check labs - goal LDL less than 70   Problem # 6:  FATIGUE (ICD-780.79) exam benign, to check labs below; follow with expectant management  Orders: T-Vitamin D (25-Hydroxy) (96295-28413) TLB-BMP (Basic Metabolic Panel-BMET) (80048-METABOL) TLB-CBC Platelet - w/Differential (85025-CBCD) TLB-Hepatic/Liver Function Pnl (80076-HEPATIC) TLB-TSH (Thyroid Stimulating Hormone) (84443-TSH) TLB-Sedimentation Rate (ESR) (85652-ESR) TLB-IBC  Pnl (Iron/FE;Transferrin) (83550-IBC) TLB-B12 + Folate Pnl (24401_02725-D66/YQI) TLB-Udip ONLY (81003-UDIP)  Complete Medication List: 1)  Proair Hfa 108 (90 Base) Mcg/act Aers (Albuterol sulfate) .... 2 puffs qid as needed 2)  Clonazepam 0.5 Mg Tabs (Clonazepam) .Marland Kitchen.. 1 by mouth two times a day as needed 3)  Lipitor 40 Mg Tabs (Atorvastatin calcium) .Marland Kitchen.. 1 by mouth once daily 4)  Losartan Potassium-hctz 100-25 Mg Tabs (Losartan potassium-hctz) .Marland Kitchen.. 1 by mouth once daily 5)  Ecotrin Low Strength 81 Mg Tbec (Aspirin) .Marland Kitchen.. 1po qd 6)  Colestipol Hcl 5 Gm Gran (Colestipol hcl) .... 5 gm by mouth two times a day 7)  Percocet 10-325 Mg Tabs (Oxycodone-acetaminophen) .Marland Kitchen.. 1 by mouth three times a day as needed for knee pain 8)  Cetirizine Hcl 10 Mg Tabs (Cetirizine  hcl) .... 1 by mouth once daily as needed 9)  Fluticasone Propionate 50 Mcg/act Susp (Fluticasone propionate) .... 2 spray/side per once daily 10)  Cymbalta 60 Mg Cpep (Duloxetine hcl) .Marland Kitchen.. 1po once daily 11)  Singulair 10 Mg Tabs (Montelukast sodium) .Marland Kitchen.. 1 by mouth once daily  Patient Instructions: 1)  start the singulair 10 mg per day for the allergies 2)  start the samples of cymbalta at 30 mg per day for 1 wk, then 60 mg per day after that (take the samples of 30 mg for one wk, use the coupon for the first month of 60 mg after that) 3)  Continue all previous medications as before this visit  4)  Please go to the Lab in the basement for your blood and/or urine tests today 5)  Please schedule a follow-up appointment in 6 months or sooner if needed Prescriptions: FLUTICASONE PROPIONATE 50 MCG/ACT SUSP (FLUTICASONE PROPIONATE) 2 spray/side per once daily  #1 x 11   Entered and Authorized by:   Corwin Levins MD   Signed by:   Corwin Levins MD on 12/03/2009   Method used:   Print then Give to Patient   RxID:   [id number] CETIRIZINE HCL 10 MG TABS (CETIRIZINE HCL) 1 by mouth once daily as needed  #30 x 11   Entered and Authorized  by:   Corwin Levins MD   Signed by:   Corwin Levins MD on 12/03/2009   Method used:   Print then Give to Patient   RxID:   [id number] COLESTIPOL HCL 5 GM GRAN (COLESTIPOL HCL) 5 gm by mouth two times a day  #60 x 11   Entered and Authorized by:   Corwin Levins MD   Signed by:   Corwin Levins MD on 12/03/2009   Method used:   Print then Give to Patient   RxID:   [id number] LOSARTAN POTASSIUM-HCTZ 100-25 MG TABS (LOSARTAN POTASSIUM-HCTZ) 1 by mouth once daily  #30 x 11   Entered and Authorized by:   Corwin Levins MD   Signed by:   Corwin Levins MD on 12/03/2009   Method used:   Print then Give to Patient   RxID:   [id number] CLONAZEPAM 0.5 MG  TABS (CLONAZEPAM) 1 by mouth two times a day as needed  #60 x 5   Entered and Authorized by:   Corwin Levins MD   Signed by:   Corwin Levins MD on 12/03/2009   Method used:   Print then Give to Patient   RxID:   [id number] PROAIR HFA 108 (90 BASE) MCG/ACT  AERS (ALBUTEROL SULFATE) 2 puffs qid as needed  #1 x 11   Entered and Authorized by:   Corwin Levins MD   Signed by:   Corwin Levins MD on 12/03/2009   Method used:   Print then Give to Patient   RxID:   [id number] CYMBALTA 60 MG CPEP (DULOXETINE HCL) 1po once daily  #30 x 11   Entered and Authorized by:   Corwin Levins MD   Signed by:   Corwin Levins MD on 12/03/2009   Method used:   Print then Give to Patient   RxID:   [id number] LIPITOR 40 MG  TABS (ATORVASTATIN CALCIUM) 1 by mouth once daily  #30 x 11   Entered and Authorized by:   Corwin Levins MD   Signed by:   Corwin Levins MD on 12/03/2009  Method used:   Print then Give to Patient   RxID:   [id number] SINGULAIR 10 MG TABS (MONTELUKAST SODIUM) 1 by mouth once daily  #90 x 3   Entered and Authorized by:   Corwin Levins MD   Signed by:   Corwin Levins MD on 12/03/2009   Method used:   Print then Give to Patient   RxID:   [id number]

## 2010-09-08 NOTE — Progress Notes (Signed)
Summary: Rx pharmacy change  Prescriptions: SIMVASTATIN 40 MG TABS (SIMVASTATIN) 1 by mouth once daily  #30 x 9   Entered by:   Margaret Pyle, CMA   Authorized by:   Corwin Levins MD   Signed by:   Margaret Pyle, CMA on 03/03/2010   Method used:   Electronically to        [facility] # [phone]* (retail)       [address] [place], [place]  [address]       Ph: [phone]       Fax: [phone]   RxID:   [id number]

## 2010-09-08 NOTE — Assessment & Plan Note (Signed)
Summary: SINUS PROBLEMS-LB   Vital Signs:  Patient profile:   65 year old female Height:      69 inches Weight:      259 pounds BMI:     38.39 O2 Sat:      96 % on Room air Temp:     98.8 degrees F oral Pulse rate:   101 / minute BP sitting:   140 / 92  (left arm) Cuff size:   large  Vitals Entered By: Zella Ball Ewing CMA Duncan Dull) (April 02, 2010 2:44 PM)  O2 Flow:  Room air CC: sinus congestion and pressure, ear pain/RE   CC:  sinus congestion and pressure and ear pain/RE.  History of Present Illness: here with acute onset fever, facial pain, pressure , greenish d/c and left earache with mild vertigo/dizziness with head movement and slight nausea;  Pt denies CP, worsening sob, doe, wheezing, orthopnea, pnd, worsening LE edema, palps, or syncope  Pt denies new neuro symptoms such as headache, facial or extremity weakness .  Had to stop the cymbalta that did help with pain control, but simply cannot afford as she is now in the donut hole.  Apparently did help with anxiety as well as her stress level much increased, and she has mutliple family members having problems that upset her.  She thinks she can afford the singulair despite $68/mo, but colestipol is $105 so cannot do this as well.  No fever, wt loss, night sweats, loss of appetite or other constitutional symptoms  Overall o/w good complaince, and good tolerability.  Trying to follow lower chol diet.  No suicidal ideation, or panic.   Problems Prior to Update: 1)  Fatigue  (ICD-780.79) 2)  Sinusitis- Acute-nos  (ICD-461.9) 3)  Sinusitis- Acute-nos  (ICD-461.9) 4)  Menopausal Syndrome  (ICD-627.2) 5)  Osteoarthritis, Knee  (ICD-715.96) 6)  Tinnitus  (ICD-388.30) 7)  Elevated Blood Pressure Without Diagnosis of Hypertension  (ICD-796.2) 8)  Preventive Health Care  (ICD-V70.0) 9)  Otitis Media, Serous, Acute, Left  (ICD-381.01) 10)  Preventive Health Care  (ICD-V70.0) 11)  Allergic Rhinitis  (ICD-477.9) 12)  Overactive Bladder   (ICD-596.51) 13)  Depression  (ICD-311) 14)  Anxiety  (ICD-300.00) 15)  Hypertension  (ICD-401.9) 16)  Hyperlipidemia  (ICD-272.4) 17)  Routine General Medical Exam@health  Care Fac[facility]  (ICD-V70.0)  Medications Prior to Update: 1)  Proair Hfa 108 (90 Base) Mcg/act  Aers (Albuterol Sulfate) .... 2 Puffs Qid As Needed 2)  Clonazepam 0.5 Mg  Tabs (Clonazepam) .Marland Kitchen.. 1 By Mouth Two Times A Day As Needed 3)  Lipitor 40 Mg  Tabs (Atorvastatin Calcium) .Marland Kitchen.. 1 By Mouth Once Daily 4)  Losartan Potassium-Hctz 100-25 Mg Tabs (Losartan Potassium-Hctz) .Marland Kitchen.. 1 By Mouth Once Daily 5)  Ecotrin Low Strength 81 Mg  Tbec (Aspirin) .Marland Kitchen.. 1po Qd 6)  Colestipol Hcl 5 Gm Gran (Colestipol Hcl) .... 5 Gm By Mouth Two Times A Day 7)  Percocet 10-325 Mg Tabs (Oxycodone-Acetaminophen) .Marland Kitchen.. 1 By Mouth Three Times A Day As Needed For Knee Pain 8)  Cetirizine Hcl 10 Mg Tabs (Cetirizine Hcl) .Marland Kitchen.. 1 By Mouth Once Daily As Needed 9)  Fluticasone Propionate 50 Mcg/act Susp (Fluticasone Propionate) .... 2 Spray/side Per Once Daily 10)  Cymbalta 60 Mg Cpep (Duloxetine Hcl) .Marland Kitchen.. 1po Once Daily 11)  Singulair 10 Mg Tabs (Montelukast Sodium) .Marland Kitchen.. 1 By Mouth Once Daily 12)  Simvastatin 40 Mg Tabs (Simvastatin) .Marland Kitchen.. 1 By Mouth Once Daily  Current Medications (verified): 1)  Proair Hfa 108 (90  Base) Mcg/act  Aers (Albuterol Sulfate) .... 2 Puffs Qid As Needed 2)  Clonazepam 0.5 Mg  Tabs (Clonazepam) .Marland Kitchen.. 1 By Mouth Two Times A Day As Needed 3)  Lipitor 40 Mg  Tabs (Atorvastatin Calcium) .Marland Kitchen.. 1 By Mouth Once Daily 4)  Losartan Potassium-Hctz 100-25 Mg Tabs (Losartan Potassium-Hctz) .Marland Kitchen.. 1 By Mouth Once Daily 5)  Ecotrin Low Strength 81 Mg  Tbec (Aspirin) .Marland Kitchen.. 1po Qd 6)  Fenofibrate 160 Mg Tabs (Fenofibrate) .Marland Kitchen.. 1po Once Daily 7)  Percocet 10-325 Mg Tabs (Oxycodone-Acetaminophen) .Marland Kitchen.. 1 By Mouth Three Times A Day As Needed For Knee Pain 8)  Cetirizine Hcl 10 Mg Tabs (Cetirizine Hcl) .Marland Kitchen.. 1 By Mouth Once Daily As Needed 9)  Fluticasone  Propionate 50 Mcg/act Susp (Fluticasone Propionate) .... 2 Spray/side Per Once Daily 10)  Sertraline Hcl 100 Mg Tabs (Sertraline Hcl) .Marland Kitchen.. 1po Once Daily 11)  Singulair 10 Mg Tabs (Montelukast Sodium) .Marland Kitchen.. 1 By Mouth Once Daily 12)  Simvastatin 40 Mg Tabs (Simvastatin) .Marland Kitchen.. 1 By Mouth Once Daily 13)  Amoxicillin-Pot Clavulanate 875-125 Mg Tabs (Amoxicillin-Pot Clavulanate) .Marland Kitchen.. 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  , mild ill  Head:  normocephalic and atraumatic.   Eyes:  vision grossly intact, pupils equal, and pupils round.   Ears:  R ear normal.  but left tm severe Red, swollen, canals clear Nose:  nasal dischargemucosal pallor and mucosal edema.   Mouth:  pharyngeal erythema and fair dentition.   Neck:  supple and no masses.   Lungs:  normal respiratory effort and normal breath sounds.   Heart:  normal rate and regular rhythm.   Msk:  no joint tenderness and no joint swelling.   Extremities:  no edema, no erythema  Neurologic:  strength normal in all extremities and gait normal.   Psych:  dysphoric affect and moderately anxious.     Impression & Recommendations:  Problem # 1:  SINUSITIS- ACUTE-NOS (ICD-461.9)  Her updated medication list for  this problem includes:    Fluticasone Propionate 50 Mcg/act Susp (Fluticasone propionate) .Marland Kitchen... 2 spray/side per once daily    Amoxicillin-pot Clavulanate 875-125 Mg Tabs (Amoxicillin-pot clavulanate) .Marland Kitchen... 1po two times a day with left ear otitis - treat as above, f/u any worsening signs or symptoms   Problem # 2:  HYPERTENSION (ICD-401.9)  Her updated medication list for this problem includes:    Losartan Potassium-hctz 100-25 Mg Tabs (Losartan potassium-hctz) .Marland Kitchen... 1 by mouth once daily  BP today: 140/92 Prior BP: 180/92 (12/03/2009)  Labs Reviewed: K+: 3.9 (12/03/2009) Creat: : 0.8 (12/03/2009)   Chol: 181 (12/03/2009)   HDL: 60.70 (12/03/2009)   LDL: 98 (12/03/2009)   TG: 114.0 (12/03/2009) overall improved, but stil mild elevated, likely due to above; Continue all previous medications as before this visit   Problem # 3:  DEPRESSION (ICD-311)  Her updated medication list for this problem includes:    Clonazepam 0.5 Mg Tabs (Clonazepam) .Marland Kitchen... 1 by mouth two times a day as needed    Sertraline Hcl 100 Mg Tabs (Sertraline hcl) .Marland Kitchen... 1po once daily treat as above, f/u any worsening signs or symptoms  - to start the zoloft as is generic, less expensive  Problem # 4:  HYPERLIPIDEMIA (ICD-272.4)  The following medications were removed from the medication list:    Lipitor 40 Mg Tabs (Atorvastatin calcium) .Marland Kitchen... 1 by mouth once daily Her updated medication list for this problem includes:    Fenofibrate 160 Mg Tabs (Fenofibrate) .Marland Kitchen... 1po once daily    Simvastatin 40 Mg Tabs (Simvastatin) .Marland Kitchen... 1 by mouth once daily  Labs Reviewed: SGOT: 23 (12/03/2009)   SGPT: 26 (12/03/2009)   HDL:60.70 (12/03/2009), 56.90 (11/13/2008)  LDL:98 (12/03/2009), 89 (11/13/2008)  Chol:181 (12/03/2009), 178 (11/13/2008)  Trig:114.0 (12/03/2009), 159.0 (11/13/2008) stable overall by hx and exam, ok to continue meds/tx as is   Complete Medication List: 1)  Proair Hfa 108 (90 Base) Mcg/act Aers (Albuterol  sulfate) .... 2 puffs qid as needed 2)  Clonazepam 0.5 Mg Tabs (Clonazepam) .Marland Kitchen.. 1 by mouth two times a day as needed 3)  Losartan Potassium-hctz 100-25 Mg Tabs (Losartan potassium-hctz) .Marland Kitchen.. 1 by mouth once daily 4)  Ecotrin Low Strength 81 Mg Tbec (Aspirin) .Marland Kitchen.. 1po qd 5)  Fenofibrate 160 Mg Tabs (Fenofibrate) .Marland Kitchen.. 1po once daily 6)  Percocet 10-325 Mg Tabs (Oxycodone-acetaminophen) .Marland Kitchen.. 1 by mouth three times a day as needed for knee pain 7)  Cetirizine Hcl 10 Mg Tabs (Cetirizine hcl) .Marland Kitchen.. 1 by mouth once daily as needed 8)  Fluticasone Propionate 50 Mcg/act Susp (Fluticasone propionate) .... 2 spray/side per once daily 9)  Sertraline Hcl 100 Mg Tabs (Sertraline hcl) .Marland Kitchen.. 1po once daily 10)  Singulair 10 Mg Tabs (Montelukast sodium) .Marland Kitchen.. 1 by mouth once daily 11)  Simvastatin 40 Mg Tabs (Simvastatin) .Marland Kitchen.. 1 by mouth once daily 12)  Amoxicillin-pot Clavulanate 875-125 Mg Tabs (Amoxicillin-pot clavulanate) .Marland Kitchen.. 1po two times a day  Patient Instructions: 1)  Please take all new medications as prescribed - the antibiotic, the generic zoloft, and the fenofibrate (for cholesterol) 2)  stop the cymbalta as you have 3)  stop the colestid as you have 4)  Start the generic zoloft at HALF per day for the first 3 days to avoid nausea (which goes away after 3 days anyway) 5)  Continue all previous medications as before this visit  6)  Please call if you need counseling referral 7)  You can also use Mucinex OTC or it's generic for congestion  8)  Please followup in October 2011 as you have planned Prescriptions: FENOFIBRATE 160 MG TABS (FENOFIBRATE) 1po once daily  #30 x 11   Entered and Authorized by:   Corwin Levins MD   Signed by:   Corwin Levins MD on 04/02/2010   Method used:   Print then Give to Patient   RxID:   [id number] SERTRALINE HCL 100 MG TABS (SERTRALINE HCL) 1po once daily  #30 x 11   Entered and Authorized by:   Corwin Levins MD   Signed by:   Corwin Levins MD on 04/02/2010    Method used:   Print then Give to Patient   RxID:   [id number] AMOXICILLIN-POT CLAVULANATE 875-125 MG TABS (AMOXICILLIN-POT CLAVULANATE) 1po two times a day  #20 x 0   Entered and Authorized by:   Len Blalock  [name] MD   Signed by:   Corwin Levins MD on 04/02/2010   Method used:   Print then Give to Patient   RxID:   [id number]

## 2010-09-08 NOTE — Progress Notes (Signed)
Summary: ABX/ Sinus infection  Phone Note Call from Patient Call back at Home Phone [phone]   Caller: Patient Summary of Call: pt called stating that ABX given last OV did not completely resolve sinus infection and now all sys are back. Pt is requesting another ABX to treat infection and a refill of nasal spray.  Initial call taken by: Margaret Pyle, CMA,  November 05, 2009 10:56 AM  Follow-up for Phone Call        done escript Follow-up by: Corwin Levins MD,  November 05, 2009 1:32 PM    New/Updated Medications: CEPHALEXIN 500 MG CAPS (CEPHALEXIN) 1po three times a day Prescriptions: CEPHALEXIN 500 MG CAPS (CEPHALEXIN) 1po three times a day  #30 x 0   Entered by:   Margaret Pyle, CMA   Authorized by:   Corwin Levins MD   Signed by:   Margaret Pyle, CMA on 11/05/2009   Method used:   Electronically to        [facility] # [phone]* (retail)       [address] [place], [place]  [address]       Ph: [phone]       Fax: [phone]   RxID:   [id number] FLUTICASONE PROPIONATE 50 MCG/ACT SUSP (FLUTICASONE PROPIONATE) 2 spray/side per once daily  #1 x 11   Entered by:   Margaret Pyle, CMA   Authorized by:   Corwin Levins MD   Signed by:   Margaret Pyle, CMA on 11/05/2009   Method used:   Electronically to        [facility] # [phone]* (retail)       [address] [place], [place]  [address]       Ph: [phone]       Fax: [phone]   RxID:   [id number] CEPHALEXIN 500 MG CAPS (CEPHALEXIN) 1po three times a day  #30 x 0   Entered and Authorized by:   Corwin Levins MD   Signed by:   Corwin Levins MD on 11/05/2009   Method used:   [facility] [id number]       [address]       [place], [place]  [address]       Ph: [phone] or [phone]       Fax: [phone]   RxID:   [id number] FLUTICASONE PROPIONATE 50 MCG/ACT SUSP (FLUTICASONE  PROPIONATE) 2 spray/side per once daily  #1 x 11   Entered and Authorized by:   Corwin Levins MD   Signed by:   Corwin Levins MD on 11/05/2009   Method used:   [facility]        [facility] [id number] ([facility])       [address]       [place], [place]  [address]       Ph: [phone] or [phone]       Fax: [phone]   RxID:   [id number] AZITHROMYCIN 250 MG TABS (AZITHROMYCIN) 2po qd for 1 day, then 1po qd for 4days, then stop  #6 x 1   Entered and Authorized by:   Corwin Levins MD   Signed by:   Corwin Levins MD on 11/05/2009  Method used:   [facility]        [id number] ([facility]       [address]       [place], [place]  [address]       Ph: [phone] or [phone]       Fax: [phone]   RxID:   [id number]

## 2010-09-08 NOTE — Progress Notes (Signed)
Summary: Generic medications  Phone Note Call from Patient Call back at Home Phone [phone]   Caller: Patient Summary of Call: Pt called stating that she is currently in Medicare doughnut hole and will have to pay 50% out of pocket for all brand name drugs. Pt is requesting to change to generics if available. Pt would also like to know if she will experience severe side effects if she stops Cymbalta ($200+)? Okay to give samples? Initial call taken by: Margaret Pyle, CMA,  March 02, 2010 5:03 PM  Follow-up for Phone Call        ok for 2 wk samples for cymbalta and lipitor;  there likely would be no major withdrawal if she stops either but there could be gradual increase in the next few wks of pain/anxiety/depression if these were present prior to starting the med Follow-up by: Corwin Levins MD,  March 02, 2010 5:16 PM  Additional Follow-up for Phone Call Additional follow up Details #1::        2 week sample in cabinet for pt pick up. Pt would like to know if there are generics that are available to replace any of her current medications? Additional Follow-up by: Margaret Pyle, CMA,  March 03, 2010 9:37 AM    Additional Follow-up for Phone Call Additional follow up Details #2::    we can change the lipitor to simvastin 40 mg - 1 per day - ok to robin to handle Follow-up by: Corwin Levins MD,  March 03, 2010 12:36 PM  New/Updated Medications: SIMVASTATIN 40 MG TABS (SIMVASTATIN) 1 by mouth once daily Prescriptions: SIMVASTATIN 40 MG TABS (SIMVASTATIN) 1 by mouth once daily  #30 x 9   Entered by:   Zella Ball Ewing CMA (AAMA)   Authorized by:   Corwin Levins MD   Signed by:   Scharlene Gloss CMA (AAMA) on 03/03/2010   Method used:   [facility]ed to ...       [id number]       [address]       [place], [place]  [address]       Ph: [phone] or [phone]       Fax: [phone]   RxID:   [id number]

## 2010-09-08 NOTE — Progress Notes (Signed)
Summary: med alt  Phone Note Call from Patient Call back at Home Phone [phone]   Caller: Patient Summary of Call: pt called stating that Colestipol pills are too big. pharmacist suggested to get alternate powder. please advise Initial call taken by: Margaret Pyle, CMA,  October 09, 2009 10:39 AM  Follow-up for Phone Call        rx re-sent to pharmacy per pt request. pt informed Follow-up by: Margaret Pyle, CMA,  October 09, 2009 2:00 PM    New/Updated Medications: COLESTIPOL HCL 5 GM GRAN (COLESTIPOL HCL) 5 gm by mouth two times a day Prescriptions: COLESTIPOL HCL 5 GM GRAN (COLESTIPOL HCL) 5 gm by mouth two times a day  #60 x 11   Entered by:   Margaret Pyle, CMA   Authorized by:   Corwin Levins MD   Signed by:   Margaret Pyle, CMA on 10/09/2009   Method used:   Electronically to        [facility] # [phone]* (retail)       [address] [place], [place]  [address]       Ph: [phone]       Fax: [phone]   RxID:   [id number] COLESTIPOL HCL 5 GM GRAN (COLESTIPOL HCL) 5 gm by mouth two times a day  #60 x 11   Entered and Authorized by:   Corwin Levins MD   Signed by:   Corwin Levins MD on 10/09/2009   Method used:   [facility] to        [id number] ([facility]ail)       [address]       [place], [place]  [address]       Ph: [phone] or [phone]       Fax: [phone]   RxID:   [id number]  done hardcopy to LIM side B - dahlia  Corwin Levins MD  October 09, 2009 1:16 PM

## 2010-09-15 ENCOUNTER — Telehealth: Payer: Self-pay | Admitting: Internal Medicine

## 2010-09-24 NOTE — Progress Notes (Signed)
Summary: cymbalta rx?  Phone Note Call from Patient Call back at [facility] Phone [phone]   Caller: Patient Summary of Call: Pt left message asking for samples of Cymbalta. She states that her insurance will cover it, but that medication is still too expensive and would like samples until she can pay for medication. Pt states she was given samples last summer and that it helped with arthritis pain and depression-please advise Initial call taken by: Brenton Grills CMA Duncan Dull),  September 15, 2010 2:56 PM  Follow-up for Phone Call        ok with me, though this is not really the office policy - ok for 2 or 3 wks if we have it Follow-up by: Corwin Levins MD,  September 15, 2010 3:23 PM  Additional Follow-up for Phone Call Additional follow up Details #1::        Called pt no ansew LMOM samples left up front for pick-up Additional Follow-up by: Orlan Leavens RMA,  September 15, 2010 3:35 PM

## 2010-10-22 ENCOUNTER — Encounter: Payer: Self-pay | Admitting: Internal Medicine

## 2010-10-22 ENCOUNTER — Ambulatory Visit (INDEPENDENT_AMBULATORY_CARE_PROVIDER_SITE_OTHER): Payer: Medicare Other | Admitting: Internal Medicine

## 2010-10-22 ENCOUNTER — Other Ambulatory Visit: Payer: Medicare Other

## 2010-10-22 ENCOUNTER — Other Ambulatory Visit: Payer: Self-pay | Admitting: Internal Medicine

## 2010-10-22 DIAGNOSIS — R5381 Other malaise: Secondary | ICD-10-CM

## 2010-10-22 DIAGNOSIS — I10 Essential (primary) hypertension: Secondary | ICD-10-CM

## 2010-10-22 DIAGNOSIS — R5383 Other fatigue: Secondary | ICD-10-CM

## 2010-10-22 DIAGNOSIS — H6690 Otitis media, unspecified, unspecified ear: Secondary | ICD-10-CM | POA: Insufficient documentation

## 2010-10-22 DIAGNOSIS — F411 Generalized anxiety disorder: Secondary | ICD-10-CM

## 2010-10-22 DIAGNOSIS — E785 Hyperlipidemia, unspecified: Secondary | ICD-10-CM

## 2010-10-22 LAB — HEPATIC FUNCTION PANEL
ALT: 31 U/L (ref 0–35)
AST: 34 U/L (ref 0–37)
Albumin: 4.3 g/dL (ref 3.5–5.2)
Alkaline Phosphatase: 55 U/L (ref 39–117)
Bilirubin, Direct: 0.2 mg/dL (ref 0.0–0.3)
Total Bilirubin: 1 mg/dL (ref 0.3–1.2)
Total Protein: 7 g/dL (ref 6.0–8.3)

## 2010-10-22 LAB — CBC WITH DIFFERENTIAL/PLATELET
Basophils Absolute: 0.1 10*3/uL (ref 0.0–0.1)
Basophils Relative: 0.9 % (ref 0.0–3.0)
Eosinophils Absolute: 0.2 10*3/uL (ref 0.0–0.7)
Eosinophils Relative: 2.5 % (ref 0.0–5.0)
HCT: 42 % (ref 36.0–46.0)
Hemoglobin: 14.7 g/dL (ref 12.0–15.0)
Lymphocytes Relative: 36.8 % (ref 12.0–46.0)
Lymphs Abs: 3.4 10*3/uL (ref 0.7–4.0)
MCHC: 35 g/dL (ref 30.0–36.0)
MCV: 92.2 fl (ref 78.0–100.0)
Monocytes Absolute: 0.5 10*3/uL (ref 0.1–1.0)
Monocytes Relative: 5.4 % (ref 3.0–12.0)
Neutro Abs: 5 10*3/uL (ref 1.4–7.7)
Neutrophils Relative %: 54.4 % (ref 43.0–77.0)
Platelets: 140 10*3/uL — ABNORMAL LOW (ref 150.0–400.0)
RBC: 4.55 Mil/uL (ref 3.87–5.11)
RDW: 13.2 % (ref 11.5–14.6)
WBC: 9.2 10*3/uL (ref 4.5–10.5)

## 2010-10-22 LAB — BASIC METABOLIC PANEL
BUN: 11 mg/dL (ref 6–23)
CO2: 28 mEq/L (ref 19–32)
Calcium: 9.7 mg/dL (ref 8.4–10.5)
Chloride: 106 mEq/L (ref 96–112)
Creatinine, Ser: 0.7 mg/dL (ref 0.4–1.2)
GFR: 95.09 mL/min (ref >60.00)
Glucose, Bld: 108 mg/dL — ABNORMAL HIGH (ref 70–99)
Potassium: 4.1 mEq/L (ref 3.5–5.1)
Sodium: 142 mEq/L (ref 135–145)

## 2010-10-22 LAB — URINALYSIS
Bilirubin Urine: NEGATIVE
Hgb urine dipstick: NEGATIVE
Ketones, ur: NEGATIVE
Leukocytes, UA: NEGATIVE
Nitrite: NEGATIVE
Specific Gravity, Urine: 1.01 (ref 1.000–1.030)
Total Protein, Urine: NEGATIVE
Urine Glucose: NEGATIVE
Urobilinogen, UA: 0.2 (ref 0.0–1.0)
pH: 6 (ref 5.0–8.0)

## 2010-10-22 LAB — LIPID PANEL
Cholesterol: 208 mg/dL — ABNORMAL HIGH (ref 0–200)
HDL: 65.3 mg/dL (ref >39.00)
Total CHOL/HDL Ratio: 3
Triglycerides: 161 mg/dL — ABNORMAL HIGH (ref 0.0–149.0)
VLDL: 32.2 mg/dL (ref 0.0–40.0)

## 2010-10-22 LAB — TSH: TSH: 1.08 u[IU]/mL (ref 0.35–5.50)

## 2010-10-22 LAB — LDL CHOLESTEROL, DIRECT: Direct LDL: 126 mg/dL

## 2010-10-23 LAB — CONVERTED CEMR LAB: Vit D, 25-Hydroxy: 19 ng/mL — ABNORMAL LOW (ref 30–89)

## 2010-10-27 NOTE — Assessment & Plan Note (Signed)
Summary: PER PT FU--STC   Vital Signs:  Patient profile:   65 year old female Height:      69 inches Weight:      266 pounds BMI:     39.42 O2 Sat:      97 % on Room air Temp:     99.1 degrees F oral Pulse rate:   118 / minute BP sitting:   160 / 84  (left arm) Cuff size:   large  Vitals Entered By: Zella Ball Ewing CMA Duncan Dull) (October 22, 2010 2:38 PM)  O2 Flow:  Room air CC: followup/RE   CC:  followup/RE.  History of Present Illness: here to f/u - overall doing ok but with acute onset 3 days mdoerate left ear pain with low grade temp, mild reduced hearing, but without vertigo, n/v, sinus pain or congestion, ST, cough and Pt denies CP, worsening sob, doe, wheezing, orthopnea, pnd, worsening LE edema, palps, dizziness or syncope  Pt denies new neuro symptoms such as headache, facial or extremity weakness  Pt denies polydipsia, polyuria  Overall good compliance with meds, trying to follow low chol  diet, wt stable, little excercise however .  No recent wt loss, night sweats, loss of appetite or other constitutional symptoms except with the acute illness.  BP at home < 140/90,  Overall good compliance with meds, and good tolerability.  Does have ongoing nasal allergy symptoms but controlled with current meds.  Does also have ongoing fatigue without OSA symptoms ongoing for months , however.  Denies worsening depressive symptoms, suicidal ideation, or panic, though does have signficant anxiety, stressors., though not worse recently per pt. Sertraliine did not seem to help as well, so went back to cymbalta, and knee pain improved as well.   Problems Prior to Update: 1)  Otitis Media, Left  (ICD-382.9) 2)  Sinusitis- Acute-nos  (ICD-461.9) 3)  Vitamin D Deficiency  (ICD-268.9) 4)  Fatigue  (ICD-780.79) 5)  Sinusitis- Acute-nos  (ICD-461.9) 6)  Sinusitis- Acute-nos  (ICD-461.9) 7)  Menopausal Syndrome  (ICD-627.2) 8)  Osteoarthritis, Knee  (ICD-715.96) 9)  Tinnitus  (ICD-388.30) 10)   Preventive Health Care  (ICD-V70.0) 11)  Otitis Media, Serous, Acute, Left  (ICD-381.01) 12)  Preventive Health Care  (ICD-V70.0) 13)  Allergic Rhinitis  (ICD-477.9) 14)  Overactive Bladder  (ICD-596.51) 15)  Depression  (ICD-311) 16)  Anxiety  (ICD-300.00) 17)  Hypertension  (ICD-401.9) 18)  Hyperlipidemia  (ICD-272.4) 19)  Routine General Medical Exam@health  Care [facility]  (ICD-V70.0)  Medications Prior to Update: 1)  Proair Hfa 108 (90 Base) Mcg/act  Aers (Albuterol Sulfate) .... 2 Puffs Qid As Needed 2)  Clonazepam 0.5 Mg  Tabs (Clonazepam) .Marland Kitchen.. 1 By Mouth Two Times A Day As Needed 3)  Losartan Potassium-Hctz 100-25 Mg Tabs (Losartan Potassium-Hctz) .Marland Kitchen.. 1 By Mouth Once Daily 4)  Ecotrin Low Strength 81 Mg  Tbec (Aspirin) .Marland Kitchen.. 1po Qd 5)  Percocet 10-325 Mg Tabs (Oxycodone-Acetaminophen) .Marland Kitchen.. 1 By Mouth Three Times A Day As Needed For Knee Pain 6)  Levocetirizine Dihydrochloride 5 Mg Tabs (Levocetirizine Dihydrochloride) .Marland Kitchen.. 1 By Mouth Once Daily 7)  Fluticasone Propionate 50 Mcg/act Susp (Fluticasone Propionate) .... 2 Spray/side Per Once Daily 8)  Sertraline Hcl 100 Mg Tabs (Sertraline Hcl) .Marland Kitchen.. 1po Once Daily 9)  Singulair 10 Mg Tabs (Montelukast Sodium) .Marland Kitchen.. 1 By Mouth Once Daily 10)  Simvastatin 40 Mg Tabs (Simvastatin) .Marland Kitchen.. 1 By Mouth Once Daily 11)  Levofloxacin 500 Mg Tabs (Levofloxacin) .Marland Kitchen.. 1po Once Daily 12)  Prednisone  10 Mg Tabs (Prednisone) .... 3po Qd For 3days, Then 2po Qd For 3days, Then 1po Qd For 3days, Then Stop  Current Medications (verified): 1)  Proair Hfa 108 (90 Base) Mcg/act  Aers (Albuterol Sulfate) .... 2 Puffs Qid As Needed 2)  Clonazepam 0.5 Mg  Tabs (Clonazepam) .Marland Kitchen.. 1 By Mouth Two Times A Day As Needed 3)  Losartan Potassium-Hctz 100-25 Mg Tabs (Losartan Potassium-Hctz) .Marland Kitchen.. 1 By Mouth Once Daily 4)  Ecotrin Low Strength 81 Mg  Tbec (Aspirin) .Marland Kitchen.. 1po Qd 5)  Percocet 10-325 Mg Tabs (Oxycodone-Acetaminophen) .Marland Kitchen.. 1 By Mouth Three Times A Day As Needed For  Knee Pain 6)  Levocetirizine Dihydrochloride 5 Mg Tabs (Levocetirizine Dihydrochloride) .Marland Kitchen.. 1 By Mouth Once Daily 7)  Fluticasone Propionate 50 Mcg/act Susp (Fluticasone Propionate) .... 2 Spray/side Per Once Daily 8)  Cymbalta 60 Mg Cpep (Duloxetine Hcl) .Marland Kitchen.. 1 By Mouth Once Daily 9)  Singulair 10 Mg Tabs (Montelukast Sodium) .Marland Kitchen.. 1 By Mouth Once Daily 10)  Simvastatin 40 Mg Tabs (Simvastatin) .Marland Kitchen.. 1 By Mouth Once Daily 11)  Cholestyramine Light 4 Gm/dose Powd (Cholestyramine Light) .... Use Asd Two Times A Day 12)  Doxycycline Hyclate 100 Mg Caps (Doxycycline Hyclate) .Marland Kitchen.. 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  , mild ill  Head:  normocephalic and atraumatic.   Eyes:  vision grossly intact, pupils equal, and pupils round.   Ears:  bilat tm's mild erythema with left much worse than right with bulging, sinus nontender bilat Nose:  nasal dischargemucosal pallor and mucosal edema.   Mouth:  pharyngeal erythema and fair dentition.   Neck:  supple and no masses.   Lungs:  normal respiratory effort and normal breath sounds.   Heart:  normal rate and regular rhythm.   Abdomen:  soft, non-tender, and normal bowel sounds.   Msk:  no  joint tenderness and no joint swelling.   Extremities:  no edema, no erythema  Neurologic:  strength normal in all extremities and gait normal.   Skin:  color normal and no rashes.   Psych:  not depressed appearing and moderately anxious.     Impression & Recommendations:  Problem # 1:  OTITIS MEDIA, LEFT (ICD-382.9)  The following medications were removed from the medication list:    Levofloxacin 500 Mg Tabs (Levofloxacin) .Marland Kitchen... 1po once daily Her updated medication list for this problem includes:    Ecotrin Low Strength 81 Mg Tbec (Aspirin) .Marland Kitchen... 1po qd    Doxycycline Hyclate 100 Mg Caps (Doxycycline hyclate) .Marland Kitchen... 1 by mouth two times a day treat as above, f/u any worsening signs or symptoms   Problem # 2:  FATIGUE (ICD-780.79)  exam benign, to check labs below; follow with expectant management   Orders: TLB-BMP (Basic Metabolic Panel-BMET) (80048-METABOL) TLB-CBC Platelet - w/Differential (85025-CBCD) TLB-Hepatic/Liver Function  Pnl (80076-HEPATIC) TLB-TSH (Thyroid Stimulating Hormone) (84443-TSH)  Problem # 3:  HYPERTENSION (ICD-401.9)  Her updated medication list for this problem includes:    Losartan Potassium-hctz 100-25 Mg Tabs (Losartan potassium-hctz) .Marland Kitchen... 1 by mouth once daily  BP today: 160/84 Prior BP: 162/88 (06/04/2010)  Labs Reviewed: K+: 3.9 (12/03/2009) Creat: : 0.8 (12/03/2009)   Chol: 181 (12/03/2009)   HDL: 60.70 (12/03/2009)   LDL: 98 (12/03/2009)   TG: 114.0 (12/03/2009) stable overall by hx and exam, ok to continue meds/tx as is  - pt adamant her BP at home is normal and has white coat HTN tendency  Orders: TLB-Udip ONLY (81003-UDIP)  Problem # 4:  HYPERLIPIDEMIA (ICD-272.4)  Her updated medication list for this problem includes:    Simvastatin 40 Mg Tabs (Simvastatin) .Marland Kitchen... 1 by mouth once daily    Cholestyramine Light 4 Gm/dose Powd (Cholestyramine light) ..... Use asd two times a day  Labs Reviewed: SGOT: 23 (12/03/2009)   SGPT: 26  (12/03/2009)   HDL:60.70 (12/03/2009), 56.90 (11/13/2008)  LDL:98 (12/03/2009), 89 (11/13/2008)  Chol:181 (12/03/2009), 178 (11/13/2008)  Trig:114.0 (12/03/2009), 159.0 (11/13/2008) stable overall by hx and exam, ok to continue meds/tx as is , Pt to continue diet efforts, good med tolerance; to check labs - goal LDL less than 100  Orders: TLB-Lipid Panel (80061-LIPID)  Problem # 5:  ANXIETY (ICD-300.00)  Her updated medication list for this problem includes:    Clonazepam 0.5 Mg Tabs (Clonazepam) .Marland Kitchen... 1 by mouth two times a day as needed    Cymbalta 60 Mg Cpep (Duloxetine hcl) .Marland Kitchen... 1 by mouth once daily  Discussed medication use and relaxation techniques.  OK to change back to cymbalta as helps with anxiety and her chronic knee pain - o/w stable overall by hx and exam, ok to continue meds/tx as is   Complete Medication List: 1)  Proair Hfa 108 (90 Base) Mcg/act Aers (Albuterol sulfate) .... 2 puffs qid as needed 2)  Clonazepam 0.5 Mg Tabs (Clonazepam) .Marland Kitchen.. 1 by mouth two times a day as needed 3)  Losartan Potassium-hctz 100-25 Mg Tabs (Losartan potassium-hctz) .Marland Kitchen.. 1 by mouth once daily 4)  Ecotrin Low Strength 81 Mg Tbec (Aspirin) .Marland Kitchen.. 1po qd 5)  Percocet 10-325 Mg Tabs (Oxycodone-acetaminophen) .Marland Kitchen.. 1 by mouth three times a day as needed for knee pain 6)  Levocetirizine Dihydrochloride 5 Mg Tabs (Levocetirizine dihydrochloride) .Marland Kitchen.. 1 by mouth once daily 7)  Fluticasone Propionate 50 Mcg/act Susp (Fluticasone propionate) .... 2 spray/side per once daily 8)  Cymbalta 60 Mg Cpep (Duloxetine hcl) .Marland Kitchen.. 1 by mouth once daily 9)  Singulair 10 Mg Tabs (Montelukast sodium) .Marland Kitchen.. 1 by mouth once daily 10)  Simvastatin 40 Mg Tabs (Simvastatin) .Marland Kitchen.. 1 by mouth once daily 11)  Cholestyramine Light 4 Gm/dose Powd (Cholestyramine light) .... Use asd two times a day 12)  Doxycycline Hyclate 100 Mg Caps (Doxycycline hyclate) .Marland Kitchen.. 1 by mouth two times a day  Other Orders: T-Vitamin D (25-Hydroxy)  [id number])  Patient Instructions: 1)  Please take all new medications as prescribed 2)  Continue all previous medications as before this visit  3)  Please go to the Lab in the basement for your blood and/or urine tests today 4)  Please call the number on the [facility] Card for results of your testing  5)  Please schedule a follow-up appointment in 1 year, or sooner if needed Prescriptions: PROAIR HFA 108 (90 BASE) MCG/ACT  AERS (ALBUTEROL SULFATE) 2 puffs qid as needed  #3 x 3  Entered and Authorized by:   Corwin Levins MD   Signed by:   Corwin Levins MD on 10/22/2010   Method used:   Print then Give to Patient   RxID:   [id number] LOSARTAN POTASSIUM-HCTZ 100-25 MG TABS (LOSARTAN POTASSIUM-HCTZ) 1 by mouth once daily  #90 x 11   Entered and Authorized by:   Corwin Levins MD   Signed by:   Corwin Levins MD on 10/22/2010   Method used:   Print then Give to Patient   RxID:   [id number] CYMBALTA 60 MG CPEP (DULOXETINE HCL) 1 by mouth once daily  #90 x 3   Entered and Authorized by:   Corwin Levins MD   Signed by:   Corwin Levins MD on 10/22/2010   Method used:   Print then Give to Patient   RxID:   [id number] LEVOCETIRIZINE DIHYDROCHLORIDE 5 MG TABS (LEVOCETIRIZINE DIHYDROCHLORIDE) 1 by mouth once daily  #90 x 3   Entered and Authorized by:   Corwin Levins MD   Signed by:   Corwin Levins MD on 10/22/2010   Method used:   Print then Give to Patient   RxID:   [id number] FLUTICASONE PROPIONATE 50 MCG/ACT SUSP (FLUTICASONE PROPIONATE) 2 spray/side per once daily  #3 x 3   Entered and Authorized by:   Corwin Levins MD   Signed by:   Corwin Levins MD on 10/22/2010   Method used:   Print then Give to Patient   RxID:   [id number] SINGULAIR 10 MG TABS (MONTELUKAST SODIUM) 1 by mouth once daily  #90 x 3   Entered and Authorized by:   Corwin Levins MD   Signed by:   Corwin Levins MD on 10/22/2010   Method used:   Print then Give to Patient   RxID:    [id number] SIMVASTATIN 40 MG TABS (SIMVASTATIN) 1 by mouth once daily  #90 x 3   Entered and Authorized by:   Corwin Levins MD   Signed by:   Corwin Levins MD on 10/22/2010   Method used:   Print then Give to Patient   RxID:   [id number] DOXYCYCLINE HYCLATE 100 MG CAPS (DOXYCYCLINE HYCLATE) 1 by mouth two times a day  #20 x 0   Entered and Authorized by:   Corwin Levins MD   Signed by:   Corwin Levins MD on 10/22/2010   Method used:   Print then Give to Patient   RxID:   [id number] CHOLESTYRAMINE LIGHT 4 GM/DOSE POWD (CHOLESTYRAMINE LIGHT) use asd two times a day  #180 x 3   Entered and Authorized by:   Corwin Levins MD   Signed by:   Corwin Levins MD on 10/22/2010   Method used:   Print then Give to Patient   RxID:   [id number] CYMBALTA 60 MG CPEP (DULOXETINE HCL) 1 by mouth once daily  #30 x 11   Entered and Authorized by:   Corwin Levins MD   Signed by:   Corwin Levins MD on 10/22/2010   Method used:   Print then Give to Patient   RxID:   [id number]    Orders Added: 1)  T-Vitamin D (25-Hydroxy) [71696-78938] 2)  TLB-BMP (Basic Metabolic Panel-BMET) [80048-METABOL] 3)  TLB-CBC Platelet - w/Differential [85025-CBCD] 4)  TLB-Hepatic/Liver Function Pnl [80076-HEPATIC] 5)  TLB-TSH (Thyroid Stimulating Hormone) [84443-TSH] 6)  TLB-Lipid Panel [80061-LIPID] 7)  TLB-Udip ONLY [81003-UDIP] 8)  Est. Patient Level IV [id number]

## 2011-02-24 ENCOUNTER — Other Ambulatory Visit: Payer: Self-pay

## 2011-02-24 MED ORDER — CLONAZEPAM 0.5 MG PO TABS: 0.5000 mg | ORAL_TABLET | Freq: Two times a day (BID) | ORAL | Status: DC

## 2011-02-24 NOTE — Telephone Encounter (Signed)
Faxed hardcopy to [facility] at [phone]

## 2011-08-11 DIAGNOSIS — I8310 Varicose veins of unspecified lower extremity with inflammation: Secondary | ICD-10-CM | POA: Diagnosis not present

## 2011-08-13 DIAGNOSIS — M79609 Pain in unspecified limb: Secondary | ICD-10-CM | POA: Diagnosis not present

## 2011-08-13 DIAGNOSIS — I8310 Varicose veins of unspecified lower extremity with inflammation: Secondary | ICD-10-CM | POA: Diagnosis not present

## 2011-08-25 DIAGNOSIS — I8310 Varicose veins of unspecified lower extremity with inflammation: Secondary | ICD-10-CM | POA: Diagnosis not present

## 2011-08-27 ENCOUNTER — Other Ambulatory Visit: Payer: Self-pay | Admitting: Internal Medicine

## 2011-08-27 DIAGNOSIS — I8310 Varicose veins of unspecified lower extremity with inflammation: Secondary | ICD-10-CM | POA: Diagnosis not present

## 2011-09-03 DIAGNOSIS — I8310 Varicose veins of unspecified lower extremity with inflammation: Secondary | ICD-10-CM | POA: Diagnosis not present

## 2011-09-03 DIAGNOSIS — M79609 Pain in unspecified limb: Secondary | ICD-10-CM | POA: Diagnosis not present

## 2011-09-08 DIAGNOSIS — I8310 Varicose veins of unspecified lower extremity with inflammation: Secondary | ICD-10-CM | POA: Diagnosis not present

## 2011-09-17 DIAGNOSIS — M79609 Pain in unspecified limb: Secondary | ICD-10-CM | POA: Diagnosis not present

## 2011-09-17 DIAGNOSIS — I8310 Varicose veins of unspecified lower extremity with inflammation: Secondary | ICD-10-CM | POA: Diagnosis not present

## 2011-10-15 DIAGNOSIS — M79609 Pain in unspecified limb: Secondary | ICD-10-CM | POA: Diagnosis not present

## 2011-10-15 DIAGNOSIS — I8310 Varicose veins of unspecified lower extremity with inflammation: Secondary | ICD-10-CM | POA: Diagnosis not present

## 2011-10-25 ENCOUNTER — Other Ambulatory Visit: Payer: Self-pay | Admitting: Internal Medicine

## 2011-10-26 ENCOUNTER — Other Ambulatory Visit: Payer: Self-pay

## 2011-10-26 MED ORDER — DULOXETINE HCL 60 MG PO CPEP: 60.0000 mg | ORAL_CAPSULE | Freq: Every day | ORAL | Status: DC

## 2011-10-27 DIAGNOSIS — I872 Venous insufficiency (chronic) (peripheral): Secondary | ICD-10-CM | POA: Diagnosis not present

## 2011-10-27 DIAGNOSIS — M7981 Nontraumatic hematoma of soft tissue: Secondary | ICD-10-CM | POA: Diagnosis not present

## 2011-10-27 DIAGNOSIS — I8310 Varicose veins of unspecified lower extremity with inflammation: Secondary | ICD-10-CM | POA: Diagnosis not present

## 2011-11-09 ENCOUNTER — Encounter: Payer: Self-pay | Admitting: Internal Medicine

## 2011-11-09 DIAGNOSIS — M1710 Unilateral primary osteoarthritis, unspecified knee: Secondary | ICD-10-CM | POA: Diagnosis not present

## 2011-11-09 DIAGNOSIS — M545 Low back pain, unspecified: Secondary | ICD-10-CM | POA: Diagnosis not present

## 2011-11-09 DIAGNOSIS — Z0000 Encounter for general adult medical examination without abnormal findings: Secondary | ICD-10-CM | POA: Insufficient documentation

## 2011-11-10 ENCOUNTER — Encounter: Payer: Self-pay | Admitting: Internal Medicine

## 2011-11-10 ENCOUNTER — Ambulatory Visit (INDEPENDENT_AMBULATORY_CARE_PROVIDER_SITE_OTHER): Payer: Medicare Other | Admitting: Internal Medicine

## 2011-11-10 ENCOUNTER — Other Ambulatory Visit (INDEPENDENT_AMBULATORY_CARE_PROVIDER_SITE_OTHER): Payer: Medicare Other

## 2011-11-10 VITALS — BP 142/80 | HR 85 | Temp 97.8°F | Resp 16 | Wt 239.0 lb

## 2011-11-10 DIAGNOSIS — I10 Essential (primary) hypertension: Secondary | ICD-10-CM

## 2011-11-10 DIAGNOSIS — M899 Disorder of bone, unspecified: Secondary | ICD-10-CM | POA: Diagnosis not present

## 2011-11-10 DIAGNOSIS — E559 Vitamin D deficiency, unspecified: Secondary | ICD-10-CM

## 2011-11-10 DIAGNOSIS — E785 Hyperlipidemia, unspecified: Secondary | ICD-10-CM

## 2011-11-10 DIAGNOSIS — M949 Disorder of cartilage, unspecified: Secondary | ICD-10-CM | POA: Diagnosis not present

## 2011-11-10 DIAGNOSIS — M8580 Other specified disorders of bone density and structure, unspecified site: Secondary | ICD-10-CM

## 2011-11-10 LAB — CBC WITH DIFFERENTIAL/PLATELET
Basophils Absolute: 0.1 10*3/uL (ref 0.0–0.1)
Eosinophils Relative: 1.8 % (ref 0.0–5.0)
HCT: 48.7 % — ABNORMAL HIGH (ref 36.0–46.0)
Lymphocytes Relative: 31.8 % (ref 12.0–46.0)
Monocytes Relative: 4.5 % (ref 3.0–12.0)
Neutrophils Relative %: 61.4 % (ref 43.0–77.0)
Platelets: 151 10*3/uL (ref 150.0–400.0)
WBC: 11.2 10*3/uL — ABNORMAL HIGH (ref 4.5–10.5)

## 2011-11-10 LAB — URINALYSIS, ROUTINE W REFLEX MICROSCOPIC
Leukocytes, UA: NEGATIVE
Nitrite: NEGATIVE
Specific Gravity, Urine: 1.01 (ref 1.000–1.030)
Urobilinogen, UA: 0.2 (ref 0.0–1.0)
pH: 6 (ref 5.0–8.0)

## 2011-11-10 LAB — BASIC METABOLIC PANEL
BUN: 12 mg/dL (ref 6–23)
CO2: 27 mEq/L (ref 19–32)
GFR: 77.03 mL/min (ref >60.00)
Glucose, Bld: 119 mg/dL — ABNORMAL HIGH (ref 70–99)
Potassium: 4.4 mEq/L (ref 3.5–5.1)

## 2011-11-10 LAB — HEPATIC FUNCTION PANEL
AST: 27 U/L (ref 0–37)
Albumin: 4.5 g/dL (ref 3.5–5.2)
Total Protein: 7.8 g/dL (ref 6.0–8.3)

## 2011-11-10 LAB — LIPID PANEL
Cholesterol: 163 mg/dL (ref 0–200)
HDL: 58.1 mg/dL (ref >39.00)
VLDL: 31.4 mg/dL (ref 0.0–40.0)

## 2011-11-10 LAB — TSH: TSH: 1 u[IU]/mL (ref 0.35–5.50)

## 2011-11-10 NOTE — Patient Instructions (Signed)
;  Continue all other medications as before Please schedule your Bone Density with the Schedulers as you leave today Please go to LAB in the Basement for the blood and/or urine tests to be done today You will be contacted by phone if any changes need to be made immediately.  Otherwise, you will receive a letter about your results with an explanation. Please return in 1 year for your yearly visit, or sooner if needed

## 2011-11-10 NOTE — Progress Notes (Signed)
Subjective:    Patient ID: Desiree Higgins, female    DOB: 03/30/1946, 66 y.o.   MRN: [id number]  HPI  Here to f/u;  S/p vein stripping successful to LLE recent, still have more work to be done.  Has really been working on  [employer] loss, now to 239 from approx 270 last yr.  Cymbalta still helping knee pain, wants to continue though quite expensive.   Did see Dr Farris Has yesterday for back and knees - now to try weaning on the oxycodone, but she is afriad of doing that.  Last dxa 2010 with minor osteopenia.  Due for colonoscopy, but pt wants to call to let us know first.  Pt denies chest pain, increased sob or doe, wheezing, orthopnea, PND, increased LE swelling, palpitations, dizziness or syncope.  Pt denies new neurological symptoms such as new headache, or facial or extremity weakness or numbness  Pt denies polydipsia, polyuria.   Pt denies fever, wt loss, night sweats, loss of appetite, or other constitutional symptoms Denies worsening depressive symptoms, suicidal ideation, or panic. Past Medical History  Diagnosis Date  . HYPERTENSION 09/22/2007    Qualifier: Diagnosis of  By: Jonny Ruiz MD, Len Blalock   . HYPERLIPIDEMIA 09/22/2007    Qualifier: Diagnosis of  By: Jonny Ruiz MD, Len Blalock   . DEPRESSION 09/22/2007    Qualifier: Diagnosis of  By: Jonny Ruiz MD, Len Blalock   . ANXIETY 09/22/2007    Qualifier: Diagnosis of  By: Jonny Ruiz MD, Len Blalock   . ALLERGIC RHINITIS 09/22/2007    Qualifier: Diagnosis of  By: Jonny Ruiz MD, Len Blalock   . VITAMIN D DEFICIENCY 06/04/2010    Qualifier: Diagnosis of  By: Jonny Ruiz MD, Len Blalock   . OVERACTIVE BLADDER 09/22/2007    Qualifier: Diagnosis of  By: Jonny Ruiz MD, Leandro Reasoner, KNEE 11/21/2008    Qualifier: Diagnosis of  By: Jonny Ruiz MD, Len Blalock    Past Surgical History  Procedure Date  . Cataract extraction     right  . Abdominal hysterectomy   . Oophorectomy   . Cholecystectomy   . Tonsillectomy   . Breast biopsy 1972    reports that she has been smoking.  She has never used smokeless tobacco.  She reports that she does not drink alcohol or use illicit drugs. family history includes Cancer in her mother. Allergies  Allergen Reactions  . Ace Inhibitors     REACTION: cough   Current Outpatient Prescriptions on File Prior to Visit  Medication Sig Dispense Refill  . clonazePAM (KLONOPIN) 0.5 MG tablet Take 1 tablet (0.5 mg total) by mouth 2 (two) times daily.  60 tablet  5  . DULoxetine (CYMBALTA) 60 MG capsule Take 1 capsule (60 mg total) by mouth daily.  30 capsule  1  . fluticasone (FLONASE) 50 MCG/ACT nasal spray Place 2 sprays into the nose daily.      Marland Kitchen levocetirizine (XYZAL) 5 MG tablet Take 5 mg by mouth daily.      Marland Kitchen losartan-hydrochlorothiazide (HYZAAR) 100-25 MG per tablet Take 1 tablet by mouth daily.      . montelukast (SINGULAIR) 10 MG tablet Take 10 mg by mouth daily.      Marland Kitchen PREVALITE 4 G packet USE AS DIRECTED TWO TIMES A DAY  180 packet  3  . PROAIR HFA 108 (90 BASE) MCG/ACT inhaler INHALE 2 PUFFS 4 TIMES A DAY AS NEEDED  25.5 g  3  . simvastatin (ZOCOR) 40 MG tablet Take 40 mg  by mouth daily.       Review of Systems Review of Systems  Constitutional: Negative for diaphoresis and unexpected weight change.  Respiratory: Negative for choking and stridor.   Gastrointestinal: Negative for vomiting and blood in stool.  Genitourinary: Negative for hematuria and decreased urine volume.  Musculoskeletal: Negative for gait problem.  Skin: Negative for color change and wound.  Neurological: Negative for tremors and numbness.  Psychiatric/Behavioral: Negative for decreased concentration. The patient is not hyperactive.       Objective:   Physical Exam BP 142/80  Pulse 85  Temp(Src) 97.8 F (36.6 C) (Oral)  Resp 16  Wt 239 lb (108.41 kg)  SpO2 92% Physical Exam  VS noted, not ill appearing Constitutional: Pt appears well-developed and well-nourished.  HENT: Head: Normocephalic.  Right Ear: External ear normal.  Left Ear: External ear normal.  Eyes: Conjunctivae  and EOM are normal. Pupils are equal, round, and reactive to light.  Neck: Normal range of motion. Neck supple.  Cardiovascular: Normal rate and regular rhythm.   Pulmonary/Chest: Effort normal and breath sounds normal.  Neurological: Pt is alert. No cranial nerve deficit.  Skin: Skin is warm. No erythema.     Assessment & Plan:

## 2011-11-10 NOTE — Assessment & Plan Note (Signed)
Has been taking 2000 units per day, for vit d re-check today,  to f/u any worsening symptoms or concerns

## 2011-11-11 ENCOUNTER — Encounter: Payer: Self-pay | Admitting: Internal Medicine

## 2011-11-11 LAB — VITAMIN D 25 HYDROXY (VIT D DEFICIENCY, FRACTURES): Vit D, 25-Hydroxy: 44 ng/mL (ref 30–89)

## 2011-11-14 ENCOUNTER — Encounter: Payer: Self-pay | Admitting: Internal Medicine

## 2011-11-14 NOTE — Assessment & Plan Note (Signed)
stable overall by hx and exam, most recent data reviewed with pt, and pt to continue medical treatment as before  BP Readings from Last 3 Encounters:  11/10/11 142/80  10/22/10 160/84  06/04/10 162/88

## 2011-11-14 NOTE — Assessment & Plan Note (Signed)
stable overall by hx and exam, most recent data reviewed with pt, and pt to continue medical treatment as before  Lab Results  Component Value Date   LDLCALC 74 11/10/2011

## 2011-11-14 NOTE — Assessment & Plan Note (Signed)
Due for dxa - to schedule soon,  to f/u any worsening symptoms or concerns, Continue all other medications as before

## 2011-11-17 ENCOUNTER — Other Ambulatory Visit: Payer: Self-pay | Admitting: Internal Medicine

## 2011-11-17 ENCOUNTER — Other Ambulatory Visit: Payer: Self-pay

## 2011-11-17 MED ORDER — LOSARTAN POTASSIUM-HCTZ 100-25 MG PO TABS: 1.0000 | ORAL_TABLET | Freq: Every day | ORAL | Status: DC

## 2011-11-22 ENCOUNTER — Other Ambulatory Visit: Payer: Self-pay | Admitting: Internal Medicine

## 2011-11-23 ENCOUNTER — Other Ambulatory Visit: Payer: Self-pay

## 2011-11-23 MED ORDER — CLONAZEPAM 0.5 MG PO TABS: 0.5000 mg | ORAL_TABLET | Freq: Two times a day (BID) | ORAL | Status: DC

## 2011-11-23 NOTE — Telephone Encounter (Signed)
Done hardcopy to [facility]  

## 2011-11-23 NOTE — Telephone Encounter (Signed)
Faxed hardcopy to pharmacy. 

## 2011-12-06 ENCOUNTER — Ambulatory Visit (INDEPENDENT_AMBULATORY_CARE_PROVIDER_SITE_OTHER)
Admission: RE | Admit: 2011-12-06 | Discharge: 2011-12-06 | Disposition: A | Discharge status: Home / Self Care | Payer: Medicare Other | Source: Ambulatory Visit

## 2011-12-06 DIAGNOSIS — M899 Disorder of bone, unspecified: Secondary | ICD-10-CM

## 2011-12-06 DIAGNOSIS — M949 Disorder of cartilage, unspecified: Secondary | ICD-10-CM

## 2011-12-06 DIAGNOSIS — M8580 Other specified disorders of bone density and structure, unspecified site: Secondary | ICD-10-CM

## 2011-12-21 ENCOUNTER — Encounter: Payer: Self-pay | Admitting: Internal Medicine

## 2011-12-22 DIAGNOSIS — M79609 Pain in unspecified limb: Secondary | ICD-10-CM | POA: Diagnosis not present

## 2011-12-22 DIAGNOSIS — I8310 Varicose veins of unspecified lower extremity with inflammation: Secondary | ICD-10-CM | POA: Diagnosis not present

## 2011-12-27 ENCOUNTER — Other Ambulatory Visit: Payer: Self-pay

## 2011-12-27 MED ORDER — DULOXETINE HCL 60 MG PO CPEP: 60.0000 mg | ORAL_CAPSULE | Freq: Every day | ORAL | Status: DC

## 2012-01-05 DIAGNOSIS — M7981 Nontraumatic hematoma of soft tissue: Secondary | ICD-10-CM | POA: Diagnosis not present

## 2012-01-05 DIAGNOSIS — M79609 Pain in unspecified limb: Secondary | ICD-10-CM | POA: Diagnosis not present

## 2012-01-05 DIAGNOSIS — I8310 Varicose veins of unspecified lower extremity with inflammation: Secondary | ICD-10-CM | POA: Diagnosis not present

## 2012-02-09 DIAGNOSIS — M79609 Pain in unspecified limb: Secondary | ICD-10-CM | POA: Diagnosis not present

## 2012-02-09 DIAGNOSIS — I8310 Varicose veins of unspecified lower extremity with inflammation: Secondary | ICD-10-CM | POA: Diagnosis not present

## 2012-02-09 DIAGNOSIS — M7981 Nontraumatic hematoma of soft tissue: Secondary | ICD-10-CM | POA: Diagnosis not present

## 2012-02-28 ENCOUNTER — Other Ambulatory Visit: Payer: Self-pay

## 2012-02-28 MED ORDER — DULOXETINE HCL 60 MG PO CPEP: 60.0000 mg | ORAL_CAPSULE | Freq: Every day | ORAL | Status: DC

## 2012-03-07 DIAGNOSIS — I8310 Varicose veins of unspecified lower extremity with inflammation: Secondary | ICD-10-CM | POA: Diagnosis not present

## 2012-03-07 DIAGNOSIS — M7981 Nontraumatic hematoma of soft tissue: Secondary | ICD-10-CM | POA: Diagnosis not present

## 2012-03-07 DIAGNOSIS — M79609 Pain in unspecified limb: Secondary | ICD-10-CM | POA: Diagnosis not present

## 2012-04-18 DIAGNOSIS — M79609 Pain in unspecified limb: Secondary | ICD-10-CM | POA: Diagnosis not present

## 2012-05-09 DIAGNOSIS — I8310 Varicose veins of unspecified lower extremity with inflammation: Secondary | ICD-10-CM | POA: Diagnosis not present

## 2012-07-10 ENCOUNTER — Other Ambulatory Visit: Payer: Self-pay | Admitting: Internal Medicine

## 2012-07-10 NOTE — Telephone Encounter (Deleted)
Error

## 2012-07-10 NOTE — Telephone Encounter (Signed)
Done hardcopy to [facility]  

## 2012-07-11 NOTE — Telephone Encounter (Signed)
Faxed hardcopy to pharmacy. 

## 2012-08-14 DIAGNOSIS — M1710 Unilateral primary osteoarthritis, unspecified knee: Secondary | ICD-10-CM | POA: Diagnosis not present

## 2012-08-14 DIAGNOSIS — M5137 Other intervertebral disc degeneration, lumbosacral region: Secondary | ICD-10-CM | POA: Diagnosis not present

## 2012-08-14 DIAGNOSIS — IMO0002 Reserved for concepts with insufficient information to code with codable children: Secondary | ICD-10-CM | POA: Diagnosis not present

## 2012-08-14 DIAGNOSIS — M545 Low back pain, unspecified: Secondary | ICD-10-CM | POA: Diagnosis not present

## 2012-08-30 ENCOUNTER — Other Ambulatory Visit: Payer: Self-pay | Admitting: Internal Medicine

## 2012-09-25 ENCOUNTER — Other Ambulatory Visit: Payer: Self-pay | Admitting: Internal Medicine

## 2012-09-25 MED ORDER — DULOXETINE HCL 30 MG PO CPEP: 60.0000 mg | ORAL_CAPSULE | Freq: Every day | ORAL | Status: DC

## 2012-10-22 ENCOUNTER — Other Ambulatory Visit: Payer: Self-pay | Admitting: Internal Medicine

## 2012-11-07 ENCOUNTER — Other Ambulatory Visit: Payer: Self-pay | Admitting: Internal Medicine

## 2012-11-21 ENCOUNTER — Other Ambulatory Visit: Payer: Self-pay | Admitting: Internal Medicine

## 2012-12-19 ENCOUNTER — Encounter: Payer: Self-pay | Admitting: Internal Medicine

## 2012-12-19 ENCOUNTER — Ambulatory Visit (INDEPENDENT_AMBULATORY_CARE_PROVIDER_SITE_OTHER): Payer: Medicare Other | Admitting: Internal Medicine

## 2012-12-19 ENCOUNTER — Other Ambulatory Visit (INDEPENDENT_AMBULATORY_CARE_PROVIDER_SITE_OTHER): Payer: Medicare Other

## 2012-12-19 VITALS — BP 142/80 | HR 91 | Temp 98.2°F | Ht 69.0 in | Wt 208.2 lb

## 2012-12-19 DIAGNOSIS — R7302 Impaired glucose tolerance (oral): Secondary | ICD-10-CM

## 2012-12-19 DIAGNOSIS — I10 Essential (primary) hypertension: Secondary | ICD-10-CM

## 2012-12-19 DIAGNOSIS — J309 Allergic rhinitis, unspecified: Secondary | ICD-10-CM

## 2012-12-19 DIAGNOSIS — Z136 Encounter for screening for cardiovascular disorders: Secondary | ICD-10-CM

## 2012-12-19 DIAGNOSIS — E785 Hyperlipidemia, unspecified: Secondary | ICD-10-CM

## 2012-12-19 DIAGNOSIS — R7309 Other abnormal glucose: Secondary | ICD-10-CM

## 2012-12-19 HISTORY — DX: Impaired glucose tolerance (oral): R73.02

## 2012-12-19 LAB — CBC WITH DIFFERENTIAL/PLATELET
Basophils Absolute: 0.1 10*3/uL (ref 0.0–0.1)
Eosinophils Absolute: 0.1 10*3/uL (ref 0.0–0.7)
Hemoglobin: 15.6 g/dL — ABNORMAL HIGH (ref 12.0–15.0)
Lymphocytes Relative: 28.5 % (ref 12.0–46.0)
MCHC: 34.7 g/dL (ref 30.0–36.0)
Monocytes Relative: 5.3 % (ref 3.0–12.0)
Neutro Abs: 7.3 10*3/uL (ref 1.4–7.7)
Neutrophils Relative %: 64.5 % (ref 43.0–77.0)
RBC: 4.92 Mil/uL (ref 3.87–5.11)
RDW: 13.2 % (ref 11.5–14.6)

## 2012-12-19 LAB — HEMOGLOBIN A1C: Hgb A1c MFr Bld: 5.6 % (ref 4.6–6.5)

## 2012-12-19 LAB — BASIC METABOLIC PANEL
CO2: 29 mEq/L (ref 19–32)
Calcium: 9.6 mg/dL (ref 8.4–10.5)
Chloride: 102 mEq/L (ref 96–112)
Creatinine, Ser: 0.8 mg/dL (ref 0.4–1.2)
Glucose, Bld: 105 mg/dL — ABNORMAL HIGH (ref 70–99)

## 2012-12-19 LAB — URINALYSIS, ROUTINE W REFLEX MICROSCOPIC
Hgb urine dipstick: NEGATIVE
Nitrite: NEGATIVE
Specific Gravity, Urine: 1.015 (ref 1.000–1.030)
Urobilinogen, UA: 0.2 (ref 0.0–1.0)

## 2012-12-19 LAB — HEPATIC FUNCTION PANEL
ALT: 20 U/L (ref 0–35)
Albumin: 4.1 g/dL (ref 3.5–5.2)
Alkaline Phosphatase: 48 U/L (ref 39–117)
Bilirubin, Direct: 0.2 mg/dL (ref 0.0–0.3)
Total Protein: 7.5 g/dL (ref 6.0–8.3)

## 2012-12-19 LAB — LIPID PANEL
HDL: 62.3 mg/dL (ref >39.00)
Triglycerides: 149 mg/dL (ref 0.0–149.0)

## 2012-12-19 MED ORDER — METHYLPREDNISOLONE ACETATE 80 MG/ML IJ SUSP
80.0000 mg | Freq: Once | INTRAMUSCULAR | Status: AC
  Administered 2012-12-19: 80 mg via INTRAMUSCULAR

## 2012-12-19 MED ORDER — FLUTICASONE FUROATE 27.5 MCG/SPRAY NA SUSP: 2.0000 | Freq: Every day | NASAL | Status: DC

## 2012-12-19 MED ORDER — TRIAMCINOLONE ACETONIDE(NASAL) 55 MCG/ACT NA INHA: 2.0000 | Freq: Every day | NASAL | Status: AC

## 2012-12-19 NOTE — Assessment & Plan Note (Signed)
Mild to mod, for depomedrol IM, veramyst asd,,  to f/u any worsening symptoms

## 2012-12-19 NOTE — Assessment & Plan Note (Signed)
stable overall by history and exam, recent data reviewed with pt, and pt to continue medical treatment as before,  to f/u any worsening symptoms or concerns Lab Results  Component Value Date   HGBA1C 5.6 12/19/2012    

## 2012-12-19 NOTE — Progress Notes (Addendum)
Subjective:    Patient ID: Desiree Higgins, female    DOB: [birth year], 67 y.o.   MRN: [id number]  HPI Here to f/u; overall doing ok,  Pt denies chest pain, increased sob or doe, wheezing, orthopnea, PND, increased LE swelling, palpitations, dizziness or syncope.  Pt denies polydipsia, polyuria,.  Pt denies new neurological symptoms such as new headache, or facial or extremity weakness or numbness.   Pt states overall good compliance with meds, has been trying to follow lower cholesterol diet, with wt overall down from 267 to 208 per pt over the past yr intentionally.  Does have several wks ongoing nasal allergy symptoms with clearish congestion, itch and sneezing, without fever, pain, ST, cough, swelling or wheezing.  Has some eustachain valve like symptoms with ear popping left > right.   Past Medical History  Diagnosis Date  . HYPERTENSION 09/22/2007    Qualifier: Diagnosis of  By: Jonny Ruiz MD, Len Blalock   . HYPERLIPIDEMIA 09/22/2007    Qualifier: Diagnosis of  By: Jonny Ruiz MD, Len Blalock   . DEPRESSION 09/22/2007    Qualifier: Diagnosis of  By: Jonny Ruiz MD, Len Blalock   . ANXIETY 09/22/2007    Qualifier: Diagnosis of  By: Jonny Ruiz MD, Len Blalock   . ALLERGIC RHINITIS 09/22/2007    Qualifier: Diagnosis of  By: Jonny Ruiz MD, Len Blalock   . VITAMIN D DEFICIENCY 06/04/2010    Qualifier: Diagnosis of  By: Jonny Ruiz MD, Len Blalock   . OVERACTIVE BLADDER 09/22/2007    Qualifier: Diagnosis of  By: Jonny Ruiz MD, Leandro Reasoner, KNEE 11/21/2008    Qualifier: Diagnosis of  By: Jonny Ruiz MD, Len Blalock   . Impaired glucose tolerance 12/19/2012   Past Surgical History  Procedure Laterality Date  . Cataract extraction      right  . Abdominal hysterectomy    . Oophorectomy    . Cholecystectomy    . Tonsillectomy    . Breast biopsy  1972    reports that she has been smoking.  She has never used smokeless tobacco. She reports that she does not drink alcohol or use illicit drugs. family history includes Cancer in her mother. Allergies  Allergen  Reactions  . Ace Inhibitors     REACTION: cough   Current Outpatient Prescriptions on File Prior to Visit  Medication Sig Dispense Refill  . aspirin 81 MG EC tablet Take 81 mg by mouth daily. Swallow whole.      . clonazePAM (KLONOPIN) 0.5 MG tablet TAKE 1 TABLET BY MOUTH TWO TIMES A DAY  60 tablet  3  . DULoxetine (CYMBALTA) 30 MG capsule Take 2 capsules (60 mg total) by mouth daily.  180 capsule  3  . losartan-hydrochlorothiazide (HYZAAR) 100-25 MG per tablet TAKE 1 TABLET BY MOUTH EVERY DAY  90 tablet  3  . montelukast (SINGULAIR) 10 MG tablet TAKE 1 TABLET BY MOUTH ONCE DAILY  90 tablet  3  . oxyCODONE-acetaminophen (PERCOCET) 10-325 MG per tablet Take 1 tablet by mouth every 4 (four) hours as needed.      Marland Kitchen PREVALITE 4 G packet USE AS DIRECTED TWO TIMES A DAY  180 packet  0  . PROAIR HFA 108 (90 BASE) MCG/ACT inhaler INHALE 2 PUFFS 4 TIMES A DAY AS NEEDED  25.5 each  2  . simvastatin (ZOCOR) 40 MG tablet TAKE 1 TABLET BY MOUTH ONCE DAILY  90 tablet  0  . fluticasone (FLONASE) 50 MCG/ACT nasal spray Place 2 sprays into the nose  daily.      . levocetirizine (XYZAL) 5 MG tablet Take 5 mg by mouth daily.       No current facility-administered medications on file prior to visit.   Review of Systems  Constitutional: Negative for unexpected weight change, or unusual diaphoresis  HENT: Negative for tinnitus.   Eyes: Negative for photophobia and visual disturbance.  Respiratory: Negative for choking and stridor.   Gastrointestinal: Negative for vomiting and blood in stool.  Genitourinary: Negative for hematuria and decreased urine volume.  Musculoskeletal: Negative for acute joint swelling Skin: Negative for color change and wound.  Neurological: Negative for tremors and numbness other than noted  Psychiatric/Behavioral: Negative for decreased concentration or  hyperactivity.       Objective:   Physical Exam BP 142/80  Pulse 91  Temp(Src) 98.2 F (36.8 C) (Oral)  Ht 5\' 9"  (1.753 m)   Wt 208 lb 4 oz (94.462 kg)  BMI 30.74 kg/m2  SpO2 95% VS noted,  Constitutional: Pt appears well-developed and well-nourished.  HENT: Head: NCAT.  Right Ear: External ear normal.  Left Ear: External ear normal.  Bilat tm's with mild erythema.  Max sinus areas tender.  Pharynx with mild erythema, no exudate Eyes: Conjunctivae and EOM are normal. Pupils are equal, round, and reactive to light.  Neck: Normal range of motion. Neck supple.  Cardiovascular: Normal rate and regular rhythm.   Pulmonary/Chest: Effort normal and breath sounds normal.  Abd:  Soft, NT, non-distended, + BS Neurological: Pt is alert. Not confused  Skin: Skin is warm. No erythema.  Psychiatric: Pt behavior is normal. Thought content normal.     Assessment & Plan:  Quality Measures addressed:  Mammogram:  pt declines and will self-refer Colorectal Cancer screening: pt declines all at this time, including FOBT, flex sig, colonoscopy

## 2012-12-19 NOTE — Patient Instructions (Addendum)
Your right ear was irrigated today You had the steroid shot today Please take all new medication as prescribed  - the Veramyst (if not too expensive) You can also take Mucinex (or it's generic off brand) for congestion and ear fullness, and tylenol as needed for pain. Please continue all other medications as before Please have the pharmacy call with any other refills you may need. Please continue your efforts at being more active, low cholesterol diet, and weight control. You are otherwise up to date with prevention measures today. Please remember to followup with your GYN for the yearly pap smear and/or mammogram Please call when you want to have the colonoscopy scheduled Please go to the LAB in the Basement (turn left off the elevator) for the tests to be done today You will be contacted by phone if any changes need to be made immediately.  Otherwise, you will receive a letter about your results with an explanation, but please check with MyChart first. Thank you for enrolling in MyChart. Please follow the instructions below to securely access your online medical record. MyChart allows you to send messages to your doctor, view your test results, renew your prescriptions, schedule appointments, and more. To Log into My Chart online, please go by [employer] or [employer] to [email or web address], or download the MyChart App from the [employer] of [employer] .  Your Username is: [email or web address] (pass [email or web address]) Please return in 6 months, or sooner if needed

## 2012-12-19 NOTE — Assessment & Plan Note (Signed)
stable overall by history and exam, recent data reviewed with pt, and pt to continue medical treatment as before,  to f/u any worsening symptoms or concerns Lab Results  Component Value Date   LDLCALC 84 12/19/2012

## 2012-12-19 NOTE — Assessment & Plan Note (Addendum)
ECG reviewed as per emr, stable overall by history and exam, recent data reviewed with pt, and pt to continue medical treatment as before,  to f/u any worsening symptoms or concerns BP Readings from Last 3 Encounters:  12/19/12 142/80  11/10/11 142/80  10/22/10 160/84

## 2013-01-29 ENCOUNTER — Other Ambulatory Visit: Payer: Self-pay | Admitting: Internal Medicine

## 2013-01-29 NOTE — Telephone Encounter (Signed)
Faxed hardcopy to [facility] 

## 2013-01-29 NOTE — Telephone Encounter (Signed)
Done hardcopy to [facility]  

## 2013-02-01 ENCOUNTER — Telehealth: Payer: Self-pay

## 2013-02-01 MED ORDER — CHOLESTYRAMINE LIGHT 4 G PO PACK: PACK | ORAL | Status: DC

## 2013-02-01 NOTE — Telephone Encounter (Signed)
rx corrected, re-sent erx

## 2013-02-01 NOTE — Telephone Encounter (Signed)
Pharmacy informed.

## 2013-02-01 NOTE — Telephone Encounter (Signed)
prevalite rx does not have instructions sent in on Monday ([facility] [phone] [facility] pharmacist)  Please instruct on instructions

## 2013-02-08 ENCOUNTER — Other Ambulatory Visit: Payer: Self-pay | Admitting: Internal Medicine

## 2013-03-19 ENCOUNTER — Other Ambulatory Visit: Payer: Self-pay | Admitting: Internal Medicine

## 2013-04-30 DIAGNOSIS — M25519 Pain in unspecified shoulder: Secondary | ICD-10-CM | POA: Diagnosis not present

## 2013-04-30 DIAGNOSIS — M545 Low back pain, unspecified: Secondary | ICD-10-CM | POA: Diagnosis not present

## 2013-04-30 DIAGNOSIS — S139XXA Sprain of joints and ligaments of unspecified parts of neck, initial encounter: Secondary | ICD-10-CM | POA: Diagnosis not present

## 2013-04-30 DIAGNOSIS — M1710 Unilateral primary osteoarthritis, unspecified knee: Secondary | ICD-10-CM | POA: Diagnosis not present

## 2013-04-30 DIAGNOSIS — IMO0002 Reserved for concepts with insufficient information to code with codable children: Secondary | ICD-10-CM | POA: Diagnosis not present

## 2013-06-01 ENCOUNTER — Encounter: Payer: Self-pay | Admitting: Internal Medicine

## 2013-06-01 NOTE — Telephone Encounter (Signed)
Nancy to see above 

## 2013-06-13 ENCOUNTER — Encounter: Payer: Self-pay | Admitting: Internal Medicine

## 2013-06-14 ENCOUNTER — Other Ambulatory Visit: Payer: Self-pay

## 2013-06-14 ENCOUNTER — Encounter: Payer: Self-pay | Admitting: Internal Medicine

## 2013-06-14 NOTE — Telephone Encounter (Signed)
[name] to see above, for 6 days cymbalta 60 mg samples if we have any, thanks

## 2013-06-18 ENCOUNTER — Encounter: Payer: Self-pay | Admitting: Internal Medicine

## 2013-06-22 ENCOUNTER — Ambulatory Visit: Payer: Medicare Other | Admitting: Internal Medicine

## 2013-08-13 DIAGNOSIS — IMO0002 Reserved for concepts with insufficient information to code with codable children: Secondary | ICD-10-CM | POA: Diagnosis not present

## 2013-08-13 DIAGNOSIS — M542 Cervicalgia: Secondary | ICD-10-CM | POA: Diagnosis not present

## 2013-08-13 DIAGNOSIS — M545 Low back pain, unspecified: Secondary | ICD-10-CM | POA: Diagnosis not present

## 2013-08-20 ENCOUNTER — Other Ambulatory Visit: Payer: Self-pay | Admitting: Internal Medicine

## 2013-08-20 NOTE — Telephone Encounter (Signed)
Done hardcopy to [facility]  

## 2013-08-20 NOTE — Telephone Encounter (Signed)
Ok to refill? Last OV 5.13.14 Last filled 6.23.14

## 2013-08-21 NOTE — Telephone Encounter (Signed)
Faxed hardcopy to [facility] 

## 2013-09-18 ENCOUNTER — Other Ambulatory Visit: Payer: Self-pay | Admitting: Internal Medicine

## 2013-11-14 ENCOUNTER — Other Ambulatory Visit: Payer: Self-pay

## 2013-11-14 MED ORDER — MONTELUKAST SODIUM 10 MG PO TABS: 10.0000 mg | ORAL_TABLET | Freq: Every day | ORAL | Status: DC

## 2013-11-14 MED ORDER — LOSARTAN POTASSIUM-HCTZ 100-25 MG PO TABS: 1.0000 | ORAL_TABLET | Freq: Every day | ORAL | Status: DC

## 2013-11-14 NOTE — Addendum Note (Signed)
Addended by: Sharon Seller B on: 11/14/2013 08:38 AM   Modules accepted: Orders

## 2013-11-19 DIAGNOSIS — M25529 Pain in unspecified elbow: Secondary | ICD-10-CM | POA: Diagnosis not present

## 2013-11-19 DIAGNOSIS — M67919 Unspecified disorder of synovium and tendon, unspecified shoulder: Secondary | ICD-10-CM | POA: Diagnosis not present

## 2013-12-26 ENCOUNTER — Ambulatory Visit (INDEPENDENT_AMBULATORY_CARE_PROVIDER_SITE_OTHER): Payer: Medicare Other | Admitting: Internal Medicine

## 2013-12-26 ENCOUNTER — Other Ambulatory Visit (INDEPENDENT_AMBULATORY_CARE_PROVIDER_SITE_OTHER): Payer: Medicare Other

## 2013-12-26 ENCOUNTER — Encounter: Payer: Self-pay | Admitting: Internal Medicine

## 2013-12-26 VITALS — BP 124/78 | HR 109 | Temp 98.5°F | Ht 69.0 in | Wt 221.5 lb

## 2013-12-26 DIAGNOSIS — Z23 Encounter for immunization: Secondary | ICD-10-CM

## 2013-12-26 DIAGNOSIS — Z136 Encounter for screening for cardiovascular disorders: Secondary | ICD-10-CM

## 2013-12-26 DIAGNOSIS — F17200 Nicotine dependence, unspecified, uncomplicated: Secondary | ICD-10-CM

## 2013-12-26 DIAGNOSIS — Z79899 Other long term (current) drug therapy: Secondary | ICD-10-CM | POA: Diagnosis not present

## 2013-12-26 DIAGNOSIS — R7309 Other abnormal glucose: Secondary | ICD-10-CM | POA: Diagnosis not present

## 2013-12-26 DIAGNOSIS — F411 Generalized anxiety disorder: Secondary | ICD-10-CM | POA: Diagnosis not present

## 2013-12-26 DIAGNOSIS — R0989 Other specified symptoms and signs involving the circulatory and respiratory systems: Secondary | ICD-10-CM

## 2013-12-26 DIAGNOSIS — J45909 Unspecified asthma, uncomplicated: Secondary | ICD-10-CM | POA: Insufficient documentation

## 2013-12-26 DIAGNOSIS — E785 Hyperlipidemia, unspecified: Secondary | ICD-10-CM

## 2013-12-26 DIAGNOSIS — R0600 Dyspnea, unspecified: Secondary | ICD-10-CM

## 2013-12-26 DIAGNOSIS — R0609 Other forms of dyspnea: Secondary | ICD-10-CM

## 2013-12-26 DIAGNOSIS — I10 Essential (primary) hypertension: Secondary | ICD-10-CM

## 2013-12-26 DIAGNOSIS — R7302 Impaired glucose tolerance (oral): Secondary | ICD-10-CM

## 2013-12-26 HISTORY — DX: Nicotine dependence, unspecified, uncomplicated: F17.200

## 2013-12-26 HISTORY — DX: Unspecified asthma, uncomplicated: J45.909

## 2013-12-26 LAB — HEPATIC FUNCTION PANEL
ALK PHOS: 55 U/L (ref 39–117)
ALT: 16 U/L (ref 0–35)
AST: 21 U/L (ref 0–37)
Albumin: 4.5 g/dL (ref 3.5–5.2)
BILIRUBIN DIRECT: 0.2 mg/dL (ref 0.0–0.3)
Total Bilirubin: 0.9 mg/dL (ref 0.2–1.2)
Total Protein: 7.6 g/dL (ref 6.0–8.3)

## 2013-12-26 LAB — CBC WITH DIFFERENTIAL/PLATELET
BASOS ABS: 0 10*3/uL (ref 0.0–0.1)
Basophils Relative: 0.4 % (ref 0.0–3.0)
EOS ABS: 0.1 10*3/uL (ref 0.0–0.7)
Eosinophils Relative: 1 % (ref 0.0–5.0)
HCT: 45.1 % (ref 36.0–46.0)
Hemoglobin: 14.9 g/dL (ref 12.0–15.0)
LYMPHS PCT: 24.9 % (ref 12.0–46.0)
Lymphs Abs: 2.5 10*3/uL (ref 0.7–4.0)
MCHC: 33 g/dL (ref 30.0–36.0)
MCV: 93.2 fl (ref 78.0–100.0)
Monocytes Absolute: 0.5 10*3/uL (ref 0.1–1.0)
Monocytes Relative: 4.7 % (ref 3.0–12.0)
Neutro Abs: 6.9 10*3/uL (ref 1.4–7.7)
Neutrophils Relative %: 69 % (ref 43.0–77.0)
Platelets: 170 10*3/uL (ref 150.0–400.0)
RBC: 4.84 Mil/uL (ref 3.87–5.11)
RDW: 13.6 % (ref 11.5–15.5)
WBC: 9.9 10*3/uL (ref 4.0–10.5)

## 2013-12-26 LAB — BASIC METABOLIC PANEL
BUN: 15 mg/dL (ref 6–23)
CHLORIDE: 101 meq/L (ref 96–112)
CO2: 27 mEq/L (ref 19–32)
Calcium: 9.7 mg/dL (ref 8.4–10.5)
Creatinine, Ser: 0.9 mg/dL (ref 0.4–1.2)
GFR: 63.41 mL/min (ref >60.00)
GLUCOSE: 109 mg/dL — AB (ref 70–99)
POTASSIUM: 4 meq/L (ref 3.5–5.1)
Sodium: 138 mEq/L (ref 135–145)

## 2013-12-26 LAB — LIPID PANEL
CHOLESTEROL: 197 mg/dL (ref 0–200)
HDL: 70.6 mg/dL (ref >39.00)
LDL CALC: 100 mg/dL — AB (ref 0–99)
Total CHOL/HDL Ratio: 3
Triglycerides: 134 mg/dL (ref 0.0–149.0)
VLDL: 26.8 mg/dL (ref 0.0–40.0)

## 2013-12-26 LAB — HEMOGLOBIN A1C: HEMOGLOBIN A1C: 5.6 % (ref 4.6–6.5)

## 2013-12-26 LAB — TSH: TSH: 1.38 u[IU]/mL (ref 0.35–4.50)

## 2013-12-26 NOTE — Addendum Note (Signed)
Addended by: Sharon Seller B on: 12/26/2013 09:48 AM   Modules accepted: Orders

## 2013-12-26 NOTE — Progress Notes (Signed)
Subjective:    Patient ID: Desiree Higgins, female    DOB: Sep 19, 1945, 68 y.o.   MRN: [id number]  HPI  Here for yearly f/u;  Overall doing ok;  Pt denies CP, worsening SOB, DOE, wheezing, orthopnea, PND, worsening LE edema, palpitations, dizziness or syncope.  Pt denies neurological change such as new headache, facial or extremity weakness.  Pt denies polydipsia, polyuria, or low sugar symptoms. Pt states overall good compliance with treatment and medications, good tolerability, and has been trying to follow lower cholesterol diet.  Pt denies worsening depressive symptoms, suicidal ideation or panic. No fever, night sweats, wt loss, loss of appetite, or other constitutional symptoms.  Pt states good ability with ADL's, has low fall risk, home safety reviewed and adequate, no other significant changes in hearing or vision, and only occasionally active with exercise. Pt continues to have recurring LBP without change in severity, bowel or bladder change, fever, wt loss,  worsening LE pain/numbness/weakness, gait change or falls - was offered extensive lumbar surgury in the past which she declined.  Due for prevnar, CT chest screening, cant stop smoking  - not interested in trying. Declines colonoscopy, just too much stress at this time Past Medical History  Diagnosis Date  . HYPERTENSION 09/22/2007    Qualifier: Diagnosis of  By: Jenny Reichmann MD, Hunt Oris   . HYPERLIPIDEMIA 09/22/2007    Qualifier: Diagnosis of  By: Jenny Reichmann MD, Hunt Oris   . DEPRESSION 09/22/2007    Qualifier: Diagnosis of  By: Jenny Reichmann MD, Hunt Oris   . ANXIETY 09/22/2007    Qualifier: Diagnosis of  By: Jenny Reichmann MD, Hunt Oris   . ALLERGIC RHINITIS 09/22/2007    Qualifier: Diagnosis of  By: Jenny Reichmann MD, Hunt Oris   . VITAMIN D DEFICIENCY 06/04/2010    Qualifier: Diagnosis of  By: Jenny Reichmann MD, [place] BLADDER 09/22/2007    Qualifier: Diagnosis of  By: Jenny Reichmann MD, Mora Bellman, KNEE 11/21/2008    Qualifier: Diagnosis of  By: Jenny Reichmann MD, Hunt Oris   .  Impaired glucose tolerance 12/19/2012  . Unspecified asthma(493.90) 12/26/2013   Past Surgical History  Procedure Laterality Date  . Cataract extraction      right  . Abdominal hysterectomy    . Oophorectomy    . Cholecystectomy    . Tonsillectomy    . Breast biopsy  1972    reports that she has been smoking.  She has never used smokeless tobacco. She reports that she does not drink alcohol or use illicit drugs. family history includes Cancer in her mother. Allergies  Allergen Reactions  . Ace Inhibitors     REACTION: cough   Current Outpatient Prescriptions on File Prior to Visit  Medication Sig Dispense Refill  . aspirin 81 MG EC tablet Take 81 mg by mouth daily. Swallow whole.      . cholestyramine light (PREVALITE) 4 G packet 1 packet by mouth twice per day  180 packet  3  . clonazePAM (KLONOPIN) 0.5 MG tablet TAKE 1 TABLET BY MOUTH TWICE A DAY  60 tablet  2  . DULoxetine (CYMBALTA) 60 MG capsule TAKE ONE CAPSULE BY MOUTH EVERY DAY  30 capsule  9  . losartan-hydrochlorothiazide (HYZAAR) 100-25 MG per tablet Take 1 tablet by mouth daily.  90 tablet  3  . montelukast (SINGULAIR) 10 MG tablet Take 1 tablet (10 mg total) by mouth at bedtime.  90 tablet  3  . oxyCODONE-acetaminophen (PERCOCET) 10-325 MG  per tablet Take 1 tablet by mouth every 4 (four) hours as needed.      Marland Kitchen PROAIR HFA 108 (90 BASE) MCG/ACT inhaler INHALE 2 PUFFS 4 TIMES A DAY AS NEEDED  25.5 each  2  . simvastatin (ZOCOR) 40 MG tablet TAKE 1 TABLET BY MOUTH ONCE DAILY  90 tablet  3  . triamcinolone (NASACORT) 55 MCG/ACT nasal inhaler Place 2 sprays into the nose daily.  1 Inhaler  12   No current facility-administered medications on file prior to visit.   Review of Systems   Constitutional: Negative for increased diaphoresis, other activity, appetite or other siginficant weight change  HENT: Negative for worsening hearing loss, ear pain, facial swelling, mouth sores and neck stiffness.   Eyes: Negative for other  worsening pain, redness or visual disturbance.  Respiratory: Negative for shortness of breath and wheezing.   Cardiovascular: Negative for chest pain and palpitations.  Gastrointestinal: Negative for diarrhea, blood in stool, abdominal distention or other pain Genitourinary: Negative for hematuria, flank pain or change in urine volume.  Musculoskeletal: Negative for myalgias or other joint complaints.  Skin: Negative for color change and wound.  Neurological: Negative for syncope and numbness. other than noted Hematological: Negative for adenopathy. or other swelling Psychiatric/Behavioral: Negative for hallucinations, self-injury, decreased concentration or other worsening agitation.      Objective:   Physical Exam BP 124/78  Pulse 109  Temp(Src) 98.5 F (36.9 C) (Oral)  Ht 5\' 9"  (1.753 m)  Wt 221 lb 8 oz (100.472 kg)  BMI 32.69 kg/m2  SpO2 96% VS noted,  Constitutional: Pt is oriented to person, place, and time. Appears well-developed and well-nourished.  Head: Normocephalic and atraumatic.  Right Ear: External ear normal.  Left Ear: External ear normal.  Nose: Nose normal.  Mouth/Throat: Oropharynx is clear and moist.  Eyes: Conjunctivae and EOM are normal. Pupils are equal, round, and reactive to light.  Neck: Normal range of motion. Neck supple. No JVD present. No tracheal deviation present.  Cardiovascular: Normal rate, regular rhythm, normal heart sounds and intact distal pulses.   Pulmonary/Chest: Effort normal and breath sounds without rales or wheezing  Abdominal: Soft. Bowel sounds are normal. NT. No HSM  Musculoskeletal: Normal range of motion. Exhibits no edema.  Lymphadenopathy:  Has no cervical adenopathy.  Neurological: Pt is alert and oriented to person, place, and time. Pt has normal reflexes. No cranial nerve deficit. Motor grossly intact Skin: Skin is warm and dry. No rash noted.  Psychiatric:  Has normal mood and affect. Behavior is normal.     Assessment  & Plan:

## 2013-12-26 NOTE — Assessment & Plan Note (Signed)
stable overall by history and exam, recent data reviewed with pt, and pt to continue medical treatment as before,  to f/u any worsening symptoms or concerns Lab Results  Component Value Date   HGBA1C 5.6 12/19/2012

## 2013-12-26 NOTE — Assessment & Plan Note (Addendum)
ECG reviewed as per emr, stable overall by history and exam, recent data reviewed with pt, and pt to continue medical treatment as before,  to f/u any worsening symptoms or concerns BP Readings from Last 3 Encounters:  12/26/13 124/78  12/19/12 142/80  11/10/11 142/80   Note:  Total time for pt hx, exam, review of record with pt in the room, determination of diagnoses and plan for further eval and tx is > 40 min, with over 50% spent in coordination and counseling of patient

## 2013-12-26 NOTE — Patient Instructions (Addendum)
You had the new Prevnar pneumonia shot today  Please continue all other medications as before, and refills have been done if requested.  Please have the pharmacy call with any other refills you may need.  You are otherwise up to date with prevention measures today.  Please continue your efforts at being more active, low cholesterol diet, and weight control.  Please go to the LAB in the Basement (turn left off the elevator) for the tests to be done today  You will be contacted by phone if any changes need to be made immediately.  Otherwise, you will receive a letter about your results with an explanation, but please check with MyChart first.  Please call for your yearly mammogram at [facility] on [facility] will be contacted regarding the referral for: Ct chest for screening  Please call if you change your mind about the screening colonoscopy  Please return in 6 months, or sooner if needed

## 2013-12-26 NOTE — Progress Notes (Signed)
Pre visit review using our clinic review tool, if applicable. No additional management support is needed unless otherwise documented below in the visit note. 

## 2013-12-26 NOTE — Assessment & Plan Note (Signed)
For screening ct chest low dose, urged to quit smoking

## 2013-12-26 NOTE — Assessment & Plan Note (Signed)
stable overall by history and exam, recent data reviewed with pt, and pt to continue medical treatment as before,  to f/u any worsening symptoms or concerns SpO2 Readings from Last 3 Encounters:  12/26/13 96%  12/19/12 95%  11/10/11 92%

## 2013-12-26 NOTE — Assessment & Plan Note (Signed)
stable overall by history and exam, recent data reviewed with pt, and pt to continue medical treatment as before,  to f/u any worsening symptoms or concerns Lab Results  Component Value Date   WBC 11.3* 12/19/2012   HGB 15.6* 12/19/2012   HCT 44.8 12/19/2012   PLT 147.0* 12/19/2012   GLUCOSE 105* 12/19/2012   CHOL 176 12/19/2012   TRIG 149.0 12/19/2012   HDL 62.30 12/19/2012   LDLDIRECT 126.0 10/22/2010   LDLCALC 84 12/19/2012   ALT 20 12/19/2012   AST 21 12/19/2012   NA 139 12/19/2012   K 4.0 12/19/2012   CL 102 12/19/2012   CREATININE 0.8 12/19/2012   BUN 13 12/19/2012   CO2 29 12/19/2012   TSH 1.33 12/19/2012   HGBA1C 5.6 12/19/2012

## 2013-12-26 NOTE — Assessment & Plan Note (Signed)
stable overall by history and exam, recent data reviewed with pt, and pt to continue medical treatment as before,  to f/u any worsening symptoms or concerns Lab Results  Component Value Date   HGBA1C 5.6 12/19/2012   Lab Results  Component Value Date   LDLCALC 84 12/19/2012   For f/u labs

## 2014-01-01 ENCOUNTER — Other Ambulatory Visit: Payer: Medicare Other

## 2014-01-14 ENCOUNTER — Other Ambulatory Visit: Payer: Self-pay | Admitting: Internal Medicine

## 2014-01-15 ENCOUNTER — Other Ambulatory Visit: Payer: Self-pay | Admitting: Internal Medicine

## 2014-01-15 NOTE — Telephone Encounter (Signed)
cymbalta done erx  Klonopin - Done hardcopy to robin

## 2014-01-15 NOTE — Telephone Encounter (Signed)
Done hardcopy to [facility]  

## 2014-01-15 NOTE — Telephone Encounter (Signed)
Faxed hardcopy to [facility] 

## 2014-01-15 NOTE — Telephone Encounter (Signed)
Faxed hardcopy to [facility] [place] 

## 2014-01-18 ENCOUNTER — Encounter: Payer: Self-pay | Admitting: Internal Medicine

## 2014-01-22 ENCOUNTER — Other Ambulatory Visit: Payer: Self-pay | Admitting: Internal Medicine

## 2014-02-18 ENCOUNTER — Other Ambulatory Visit: Payer: Self-pay | Admitting: Internal Medicine

## 2014-04-25 DIAGNOSIS — M545 Low back pain, unspecified: Secondary | ICD-10-CM | POA: Diagnosis not present

## 2014-04-25 DIAGNOSIS — M1710 Unilateral primary osteoarthritis, unspecified knee: Secondary | ICD-10-CM | POA: Diagnosis not present

## 2014-04-25 DIAGNOSIS — IMO0002 Reserved for concepts with insufficient information to code with codable children: Secondary | ICD-10-CM | POA: Diagnosis not present

## 2014-05-21 ENCOUNTER — Other Ambulatory Visit: Payer: Self-pay

## 2014-05-21 MED ORDER — CHOLESTYRAMINE LIGHT 4 G PO PACK: PACK | ORAL | Status: DC

## 2014-06-11 ENCOUNTER — Other Ambulatory Visit: Payer: Self-pay | Admitting: Internal Medicine

## 2014-06-12 ENCOUNTER — Other Ambulatory Visit: Payer: Self-pay | Admitting: Internal Medicine

## 2014-06-13 NOTE — Telephone Encounter (Signed)
Faxed hardcopy for Clonazepam to [facility]

## 2014-06-13 NOTE — Telephone Encounter (Signed)
Done hardcopy to [facility]  

## 2014-07-03 ENCOUNTER — Encounter: Payer: Self-pay | Admitting: Internal Medicine

## 2014-07-03 ENCOUNTER — Ambulatory Visit (INDEPENDENT_AMBULATORY_CARE_PROVIDER_SITE_OTHER): Payer: Medicare Other | Admitting: Internal Medicine

## 2014-07-03 VITALS — BP 140/82 | HR 94 | Temp 98.3°F | Wt 226.0 lb

## 2014-07-03 DIAGNOSIS — J069 Acute upper respiratory infection, unspecified: Secondary | ICD-10-CM | POA: Diagnosis not present

## 2014-07-03 MED ORDER — AMOXICILLIN 500 MG PO CAPS: 500.0000 mg | ORAL_CAPSULE | Freq: Three times a day (TID) | ORAL | Status: DC

## 2014-07-03 NOTE — Patient Instructions (Signed)

## 2014-07-03 NOTE — Progress Notes (Signed)
   Subjective:    Patient ID: Desiree Higgins, female    DOB: [birth year], 68 y.o.   MRN: [id number]  HPI 3 weeks ago she developed a "cold" with sneezing  She's had progressive frontal and maxillary sinus congestion and pressure along with pressure in R neck & pain in mandibular teeth.  She now describes malaise  Her cough is described as more clearing of her throat rather than a deep chest cough  She's been taking over-the-counter cough syrup. She also been using daily rinse  She's had some chills.  She is affable headache no current smoking for at least 20 years. It somewhat difficult to really pinpoint much she has smoked.    Review of Systems Nasal purulence,  sore throat , otic pain or otic discharge denied. No fever or sweats.     Objective:   Physical Exam General appearance:good health ;well nourished; no acute distress or increased work of breathing is present.  No  lymphadenopathy about the head, neck, or axilla noted.   Eyes: No conjunctival inflammation or lid edema is present. There is no scleral icterus.  Ears:  External ear exam shows no significant lesions or deformities.  Otoscopic examination reveals clear canals, tympanic membranes are intact bilaterally without bulging, retraction, inflammation or discharge.  Nose:  External nasal examination shows no deformity or inflammation. Nasal mucosa are pink arynd moist without lesions or exudates. No septal dislocation or deviation.No obstruction to airflow.   Oral exam: Upper plate; lips and gums are healthy appearing.There is no oropharyngeal erythema or exudate noted.   Neck:  No deformities, thyromegaly, masses, or tenderness noted.   Supple with full range of motion without pain.   Heart:  Normal rate and regular rhythm. S1 and S2 normal without gallop, murmur, click, rub or other extra sounds.Slight respiratory variation to rhythm   Lungs:Minor bibasilar rales ,or rubs present.No increased work of breathing.   Decreased BS  Extremities:  No cyanosis, edema, or clubbing  noted    Skin: Warm & dry w/o jaundice or tenting.         Assessment & Plan:  #1upper respiratory infection #2 smoker , no interest in quitting See orders

## 2014-07-03 NOTE — Progress Notes (Signed)
Pre visit review using our clinic review tool, if applicable. No additional management support is needed unless otherwise documented below in the visit note. 

## 2014-07-10 ENCOUNTER — Telehealth: Payer: Self-pay | Admitting: Internal Medicine

## 2014-07-10 NOTE — Telephone Encounter (Signed)
emmi emailed °

## 2014-08-19 ENCOUNTER — Other Ambulatory Visit: Payer: Self-pay | Admitting: Internal Medicine

## 2014-08-20 DIAGNOSIS — M545 Low back pain: Secondary | ICD-10-CM | POA: Diagnosis not present

## 2014-10-25 ENCOUNTER — Telehealth: Payer: Self-pay

## 2014-10-25 NOTE — Telephone Encounter (Signed)
Left message for pt to call back if she wants flu vaccine

## 2014-10-30 ENCOUNTER — Other Ambulatory Visit: Payer: Self-pay | Admitting: Internal Medicine

## 2014-10-31 NOTE — Telephone Encounter (Signed)
Script [facility] [address]

## 2014-10-31 NOTE — Telephone Encounter (Signed)
Done hardcopy to Cherina  

## 2014-11-20 ENCOUNTER — Other Ambulatory Visit: Payer: Self-pay | Admitting: Internal Medicine

## 2014-12-13 ENCOUNTER — Other Ambulatory Visit: Payer: Self-pay | Admitting: Internal Medicine

## 2015-01-07 DIAGNOSIS — S7001XA Contusion of right hip, initial encounter: Secondary | ICD-10-CM | POA: Diagnosis not present

## 2015-01-07 DIAGNOSIS — M545 Low back pain: Secondary | ICD-10-CM | POA: Diagnosis not present

## 2015-01-11 ENCOUNTER — Other Ambulatory Visit: Payer: Self-pay | Admitting: Internal Medicine

## 2015-02-12 ENCOUNTER — Other Ambulatory Visit: Payer: Self-pay | Admitting: Internal Medicine

## 2015-02-19 ENCOUNTER — Other Ambulatory Visit: Payer: Self-pay

## 2015-02-19 ENCOUNTER — Encounter: Payer: Self-pay | Admitting: Internal Medicine

## 2015-02-19 ENCOUNTER — Other Ambulatory Visit: Payer: Self-pay | Admitting: Internal Medicine

## 2015-02-19 MED ORDER — MONTELUKAST SODIUM 10 MG PO TABS: 10.0000 mg | ORAL_TABLET | Freq: Every day | ORAL | Status: DC

## 2015-02-26 MED ORDER — CLONAZEPAM 0.5 MG PO TABS: 0.5000 mg | ORAL_TABLET | Freq: Two times a day (BID) | ORAL | Status: DC

## 2015-02-26 NOTE — Telephone Encounter (Signed)
Dahlia to see above; klonopin done hardcopy Tammy to see above,   thanks

## 2015-03-10 ENCOUNTER — Other Ambulatory Visit: Payer: Self-pay | Admitting: Internal Medicine

## 2015-03-17 ENCOUNTER — Other Ambulatory Visit: Payer: Self-pay | Admitting: Internal Medicine

## 2015-03-19 ENCOUNTER — Other Ambulatory Visit (INDEPENDENT_AMBULATORY_CARE_PROVIDER_SITE_OTHER): Payer: Medicare Other

## 2015-03-19 ENCOUNTER — Encounter: Payer: Self-pay | Admitting: Internal Medicine

## 2015-03-19 ENCOUNTER — Ambulatory Visit (INDEPENDENT_AMBULATORY_CARE_PROVIDER_SITE_OTHER): Payer: Medicare Other | Admitting: Internal Medicine

## 2015-03-19 VITALS — BP 138/80 | HR 92 | Temp 98.6°F | Ht 69.0 in | Wt 235.0 lb

## 2015-03-19 DIAGNOSIS — R7302 Impaired glucose tolerance (oral): Secondary | ICD-10-CM

## 2015-03-19 DIAGNOSIS — E785 Hyperlipidemia, unspecified: Secondary | ICD-10-CM | POA: Diagnosis not present

## 2015-03-19 DIAGNOSIS — I10 Essential (primary) hypertension: Secondary | ICD-10-CM

## 2015-03-19 DIAGNOSIS — E559 Vitamin D deficiency, unspecified: Secondary | ICD-10-CM

## 2015-03-19 LAB — CBC WITH DIFFERENTIAL/PLATELET
Basophils Absolute: 0.1 10*3/uL (ref 0.0–0.1)
Basophils Relative: 0.6 % (ref 0.0–3.0)
EOS ABS: 0.4 10*3/uL (ref 0.0–0.7)
Eosinophils Relative: 4.3 % (ref 0.0–5.0)
HCT: 42.6 % (ref 36.0–46.0)
Hemoglobin: 14.4 g/dL (ref 12.0–15.0)
LYMPHS ABS: 3.7 10*3/uL (ref 0.7–4.0)
Lymphocytes Relative: 39.2 % (ref 12.0–46.0)
MCHC: 33.8 g/dL (ref 30.0–36.0)
MCV: 90.3 fl (ref 78.0–100.0)
Monocytes Absolute: 0.7 10*3/uL (ref 0.1–1.0)
Monocytes Relative: 7.1 % (ref 3.0–12.0)
NEUTROS ABS: 4.6 10*3/uL (ref 1.4–7.7)
NEUTROS PCT: 48.8 % (ref 43.0–77.0)
Platelets: 183 10*3/uL (ref 150.0–400.0)
RBC: 4.72 Mil/uL (ref 3.87–5.11)
RDW: 14.7 % (ref 11.5–15.5)
WBC: 9.4 10*3/uL (ref 4.0–10.5)

## 2015-03-19 LAB — HEPATIC FUNCTION PANEL
ALK PHOS: 68 U/L (ref 39–117)
ALT: 17 U/L (ref 0–35)
AST: 19 U/L (ref 0–37)
Albumin: 4 g/dL (ref 3.5–5.2)
BILIRUBIN TOTAL: 0.6 mg/dL (ref 0.2–1.2)
Bilirubin, Direct: 0.1 mg/dL (ref 0.0–0.3)
Total Protein: 7.4 g/dL (ref 6.0–8.3)

## 2015-03-19 LAB — BASIC METABOLIC PANEL
BUN: 17 mg/dL (ref 6–23)
CHLORIDE: 102 meq/L (ref 96–112)
CO2: 27 meq/L (ref 19–32)
CREATININE: 0.8 mg/dL (ref 0.40–1.20)
Calcium: 9.6 mg/dL (ref 8.4–10.5)
GFR: 75.17 mL/min (ref >60.00)
Glucose, Bld: 112 mg/dL — ABNORMAL HIGH (ref 70–99)
POTASSIUM: 3.7 meq/L (ref 3.5–5.1)
SODIUM: 140 meq/L (ref 135–145)

## 2015-03-19 LAB — TSH: TSH: 2.61 u[IU]/mL (ref 0.35–4.50)

## 2015-03-19 LAB — LIPID PANEL
CHOLESTEROL: 170 mg/dL (ref 0–200)
HDL: 66.3 mg/dL (ref >39.00)
LDL CALC: 79 mg/dL (ref 0–99)
NonHDL: 104.14
Total CHOL/HDL Ratio: 3
Triglycerides: 127 mg/dL (ref 0.0–149.0)
VLDL: 25.4 mg/dL (ref 0.0–40.0)

## 2015-03-19 LAB — URINALYSIS, ROUTINE W REFLEX MICROSCOPIC
BILIRUBIN URINE: NEGATIVE
HGB URINE DIPSTICK: NEGATIVE
Ketones, ur: NEGATIVE
LEUKOCYTES UA: NEGATIVE
NITRITE: NEGATIVE
Specific Gravity, Urine: 1.015 (ref 1.000–1.030)
Total Protein, Urine: NEGATIVE
UROBILINOGEN UA: 0.2 (ref 0.0–1.0)
Urine Glucose: NEGATIVE
pH: 5.5 (ref 5.0–8.0)

## 2015-03-19 LAB — VITAMIN D 25 HYDROXY (VIT D DEFICIENCY, FRACTURES): VITD: 23.69 ng/mL — ABNORMAL LOW (ref 30.00–100.00)

## 2015-03-19 LAB — HEMOGLOBIN A1C: HEMOGLOBIN A1C: 5.9 % (ref 4.6–6.5)

## 2015-03-19 MED ORDER — DULOXETINE HCL 60 MG PO CPEP: 60.0000 mg | ORAL_CAPSULE | Freq: Every day | ORAL | Status: DC

## 2015-03-19 MED ORDER — LOSARTAN POTASSIUM-HCTZ 100-25 MG PO TABS: 1.0000 | ORAL_TABLET | Freq: Every day | ORAL | Status: DC

## 2015-03-19 MED ORDER — MONTELUKAST SODIUM 10 MG PO TABS: 10.0000 mg | ORAL_TABLET | Freq: Every day | ORAL | Status: DC

## 2015-03-19 MED ORDER — ALBUTEROL SULFATE HFA 108 (90 BASE) MCG/ACT IN AERS: INHALATION_SPRAY | RESPIRATORY_TRACT | Status: DC

## 2015-03-19 MED ORDER — CHOLESTYRAMINE LIGHT 4 G PO PACK: 4.0000 g | PACK | Freq: Two times a day (BID) | ORAL | Status: DC

## 2015-03-19 MED ORDER — SIMVASTATIN 40 MG PO TABS: 40.0000 mg | ORAL_TABLET | Freq: Every day | ORAL | Status: DC
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
## 2015-03-19 NOTE — Assessment & Plan Note (Signed)
stable overall by history and exam, recent data reviewed with pt, and pt to continue medical treatment as before,  to f/u any worsening symptoms or concerns Lab Results  Component Value Date   LDLCALC 100* 12/26/2013

## 2015-03-19 NOTE — Patient Instructions (Signed)
Dahlia to help you with Cologuard application form  Please continue all other medications as before, and refills have been done if requested.  Please have the pharmacy call with any other refills you may need.  Please continue your efforts at being more active, low cholesterol diet, and weight control.  You are otherwise up to date with prevention measures today.  Please keep your appointments with your specialists as you may have planned  Please go to the LAB in the Basement (turn left off the elevator) for the tests to be done today  You will be contacted by phone if any changes need to be made immediately.  Otherwise, you will receive a letter about your results with an explanation, but please check with MyChart first.  Please return in 1 year for your yearly visit, or sooner if needed

## 2015-03-19 NOTE — Assessment & Plan Note (Signed)
stable overall by history and exam, recent data reviewed with pt, and pt to continue medical treatment as before,  to f/u any worsening symptoms or concerns Lab Results  Component Value Date   HGBA1C 5.6 12/26/2013

## 2015-03-19 NOTE — Assessment & Plan Note (Signed)
stable overall by history and exam, recent data reviewed with pt, and pt to continue medical treatment as before,  to f/u any worsening symptoms or concerns  

## 2015-03-19 NOTE — Progress Notes (Signed)
Pre visit review using our clinic review tool, if applicable. No additional management support is needed unless otherwise documented below in the visit note. 

## 2015-03-19 NOTE — Assessment & Plan Note (Signed)
stable overall by history and exam, recent data reviewed with pt, and pt to continue medical treatment as before,  to f/u any worsening symptoms or concerns BP Readings from Last 3 Encounters:  03/19/15 138/80  07/03/14 140/82  12/26/13 124/78

## 2015-04-15 ENCOUNTER — Other Ambulatory Visit: Payer: Self-pay | Admitting: Sports Medicine

## 2015-04-15 DIAGNOSIS — M545 Low back pain: Secondary | ICD-10-CM | POA: Diagnosis not present

## 2015-04-15 DIAGNOSIS — M549 Dorsalgia, unspecified: Secondary | ICD-10-CM

## 2015-04-16 ENCOUNTER — Other Ambulatory Visit: Payer: Self-pay | Admitting: Internal Medicine

## 2015-04-16 ENCOUNTER — Encounter: Payer: Self-pay | Admitting: Internal Medicine

## 2015-04-16 MED ORDER — LOSARTAN POTASSIUM-HCTZ 100-25 MG PO TABS: 1.0000 | ORAL_TABLET | Freq: Every day | ORAL | Status: DC

## 2015-04-16 NOTE — Addendum Note (Signed)
Addended by: Lyman Bishop on: 04/16/2015 11:42 AM   Modules accepted: Orders

## 2015-04-25 ENCOUNTER — Telehealth: Payer: Self-pay | Admitting: General Practice

## 2015-04-25 NOTE — Telephone Encounter (Signed)
Patient declined to schedule mammogram at this time.

## 2015-04-26 ENCOUNTER — Ambulatory Visit
Admission: RE | Admit: 2015-04-26 | Discharge: 2015-04-26 | Disposition: A | Discharge status: Home / Self Care | Payer: Medicare Other | Source: Ambulatory Visit | Attending: Sports Medicine | Admitting: Sports Medicine

## 2015-04-26 DIAGNOSIS — M549 Dorsalgia, unspecified: Secondary | ICD-10-CM

## 2015-04-26 DIAGNOSIS — S32020A Wedge compression fracture of second lumbar vertebra, initial encounter for closed fracture: Secondary | ICD-10-CM | POA: Diagnosis not present

## 2015-04-26 DIAGNOSIS — M5126 Other intervertebral disc displacement, lumbar region: Secondary | ICD-10-CM | POA: Diagnosis not present

## 2015-04-28 DIAGNOSIS — M5106 Intervertebral disc disorders with myelopathy, lumbar region: Secondary | ICD-10-CM | POA: Diagnosis not present

## 2015-04-28 DIAGNOSIS — M545 Low back pain: Secondary | ICD-10-CM | POA: Diagnosis not present

## 2015-05-07 DIAGNOSIS — M545 Low back pain: Secondary | ICD-10-CM | POA: Diagnosis not present

## 2015-05-07 DIAGNOSIS — M5106 Intervertebral disc disorders with myelopathy, lumbar region: Secondary | ICD-10-CM | POA: Diagnosis not present

## 2015-05-18 ENCOUNTER — Other Ambulatory Visit: Payer: Self-pay | Admitting: Internal Medicine

## 2015-05-21 DIAGNOSIS — M170 Bilateral primary osteoarthritis of knee: Secondary | ICD-10-CM | POA: Diagnosis not present

## 2015-05-21 DIAGNOSIS — S335XXA Sprain of ligaments of lumbar spine, initial encounter: Secondary | ICD-10-CM | POA: Diagnosis not present

## 2015-05-28 ENCOUNTER — Other Ambulatory Visit: Payer: Self-pay | Admitting: Internal Medicine

## 2015-05-30 ENCOUNTER — Telehealth: Payer: Self-pay

## 2015-05-30 NOTE — Telephone Encounter (Signed)
Patient called to educate on Medicare Wellness apt. LVM for the patient to call back to educate and schedule for wellness visit.  LVM with number for call back

## 2015-06-02 NOTE — Telephone Encounter (Signed)
Call to the patient and left 2nd vm regarding AWV.

## 2015-06-03 ENCOUNTER — Encounter: Payer: Self-pay | Admitting: Internal Medicine

## 2015-07-09 DIAGNOSIS — M545 Low back pain: Secondary | ICD-10-CM | POA: Diagnosis not present

## 2015-07-29 ENCOUNTER — Encounter: Payer: Self-pay | Admitting: Internal Medicine

## 2015-08-06 ENCOUNTER — Encounter: Payer: Self-pay | Admitting: Internal Medicine

## 2015-08-08 ENCOUNTER — Ambulatory Visit (INDEPENDENT_AMBULATORY_CARE_PROVIDER_SITE_OTHER): Payer: Medicare Other | Admitting: Internal Medicine

## 2015-08-08 ENCOUNTER — Encounter: Payer: Self-pay | Admitting: Internal Medicine

## 2015-08-08 VITALS — BP 162/90 | HR 114 | Temp 98.6°F | Ht 69.0 in | Wt 247.0 lb

## 2015-08-08 DIAGNOSIS — J019 Acute sinusitis, unspecified: Secondary | ICD-10-CM | POA: Diagnosis not present

## 2015-08-08 DIAGNOSIS — R7302 Impaired glucose tolerance (oral): Secondary | ICD-10-CM | POA: Diagnosis not present

## 2015-08-08 DIAGNOSIS — F411 Generalized anxiety disorder: Secondary | ICD-10-CM

## 2015-08-08 DIAGNOSIS — I10 Essential (primary) hypertension: Secondary | ICD-10-CM | POA: Diagnosis not present

## 2015-08-08 MED ORDER — HYDROCODONE-HOMATROPINE 5-1.5 MG/5ML PO SYRP: 5.0000 mL | ORAL_SOLUTION | Freq: Four times a day (QID) | ORAL | Status: DC | PRN

## 2015-08-08 MED ORDER — CLONAZEPAM 0.5 MG PO TABS: 0.5000 mg | ORAL_TABLET | Freq: Two times a day (BID) | ORAL | Status: DC

## 2015-08-08 MED ORDER — LEVOFLOXACIN 250 MG PO TABS: 250.0000 mg | ORAL_TABLET | Freq: Every day | ORAL | Status: DC

## 2015-08-08 NOTE — Assessment & Plan Note (Signed)
stable overall by history and exam, recent data reviewed with pt, and pt to continue medical treatment as before,  to f/u any worsening symptoms or concerns BP Readings from Last 3 Encounters:  08/08/15 162/90  03/19/15 138/80  07/03/14 140/82

## 2015-08-08 NOTE — Assessment & Plan Note (Signed)
stable overall by history and exam, recent data reviewed with pt, and pt to continue medical treatment as before,  to f/u any worsening symptoms or concerns Lab Results  Component Value Date   HGBA1C 5.9 03/19/2015

## 2015-08-08 NOTE — Patient Instructions (Addendum)
Please take all new medication as prescribed - the antibiotic, and cough medicine if needed  Please continue all other medications as before, and refills have been done if requested - the klonazepam  Please have the pharmacy call with any other refills you may need.  Please keep your appointments with your specialists as you may have planned  Please return in 6 months, or sooner if needed

## 2015-08-08 NOTE — Addendum Note (Signed)
Addended by: Lyman Bishop on: 08/08/2015 11:18 AM   Modules accepted: Orders

## 2015-08-08 NOTE — Progress Notes (Signed)
Subjective:    Patient ID: Desiree Higgins, female    DOB: August 25, 1945, 69 y.o.   MRN: [id number]  HPI   Here with 2 wks worsening acute onset fever, facial pain, pressure, headache, general weakness and malaise, and greenish d/c, with mild ST and cough, but pt denies chest pain, wheezing, increased sob or doe, orthopnea, PND, increased LE swelling, palpitations, dizziness or syncope.  Does have several wks ongoing nasal allergy symptoms with clearish congestion, itch and sneezing, without fever, pain, ST, cough, swelling or wheezing, has not been using the nasal spray recently. Also Denies worsening depressive symptoms, suicidal ideation, or panic; has ongoing anxiety, asks for refill meds  Pt continues to have recurring LBP without change in severity, bowel or bladder change, fever, wt loss,  worsening LE pain/numbness/weakness, gait change or falls, not really improved s/p recent [facility] Past Medical History  Diagnosis Date  . HYPERTENSION 09/22/2007    Qualifier: Diagnosis of  By: Jenny Reichmann MD, Hunt Oris   . HYPERLIPIDEMIA 09/22/2007    Qualifier: Diagnosis of  By: Jenny Reichmann MD, Hunt Oris   . DEPRESSION 09/22/2007    Qualifier: Diagnosis of  By: Jenny Reichmann MD, Hunt Oris   . ANXIETY 09/22/2007    Qualifier: Diagnosis of  By: Jenny Reichmann MD, Hunt Oris   . ALLERGIC RHINITIS 09/22/2007    Qualifier: Diagnosis of  By: Jenny Reichmann MD, Hunt Oris   . VITAMIN D DEFICIENCY 06/04/2010    Qualifier: Diagnosis of  By: Jenny Reichmann MD, [place] BLADDER 09/22/2007    Qualifier: Diagnosis of  By: Jenny Reichmann MD, Mora Bellman, KNEE 11/21/2008    Qualifier: Diagnosis of  By: Jenny Reichmann MD, Hunt Oris   . Impaired glucose tolerance 12/19/2012  . Unspecified asthma(493.90) 12/26/2013  . Smoker 12/26/2013   Past Surgical History  Procedure Laterality Date  . Cataract extraction      right  . Abdominal hysterectomy    . Oophorectomy    . Cholecystectomy    . Tonsillectomy    . Breast biopsy  1972    reports that she has been smoking.  She has never  used smokeless tobacco. She reports that she does not drink alcohol or use illicit drugs. family history includes Cancer in her mother. Allergies  Allergen Reactions  . Ace Inhibitors     REACTION: cough   Current Outpatient Prescriptions on File Prior to Visit  Medication Sig Dispense Refill  . albuterol (PROAIR HFA) 108 (90 BASE) MCG/ACT inhaler INHALE 2 PUFFS 4 TIMES A DAY AS NEEDED 25.5 each 3  . aspirin 81 MG EC tablet Take 81 mg by mouth daily. Swallow whole.    . cholestyramine light (PREVALITE) 4 G packet Take 1 packet (4 g total) by mouth 2 (two) times daily. 180 packet 3  . clonazePAM (KLONOPIN) 0.5 MG tablet Take 1 tablet (0.5 mg total) by mouth 2 (two) times daily. As needed 60 tablet 2  . DULoxetine (CYMBALTA) 60 MG capsule Take 1 capsule (60 mg total) by mouth daily. 90 capsule 3  . losartan-hydrochlorothiazide (HYZAAR) 100-25 MG per tablet Take 1 tablet by mouth daily. 90 tablet 3  . montelukast (SINGULAIR) 10 MG tablet Take 1 tablet (10 mg total) by mouth at bedtime. 90 tablet 1  . oxyCODONE-acetaminophen (PERCOCET) 10-325 MG per tablet Take 1 tablet by mouth every 4 (four) hours as needed.    . simvastatin (ZOCOR) 40 MG tablet Take 1 tablet (40 mg total) by mouth daily. [place]  tablet 3  . amoxicillin (AMOXIL) 500 MG capsule Take 1 capsule (500 mg total) by mouth 3 (three) times daily. (Patient not taking: Reported on 03/19/2015) 30 capsule 0  . triamcinolone (NASACORT) 55 MCG/ACT nasal inhaler Place 2 sprays into the nose daily. (Patient not taking: Reported on 08/08/2015) 1 Inhaler 12   No current facility-administered medications on file prior to visit.   Review of Systems  Constitutional: Negative for unusual diaphoresis or night sweats HENT: Negative for ringing in ear or discharge Eyes: Negative for double vision or worsening visual disturbance.  Respiratory: Negative for choking and stridor.   Gastrointestinal: Negative for vomiting or other signifcant bowel  change Genitourinary: Negative for hematuria or change in urine volume.  Musculoskeletal: Negative for other MSK pain or swelling Skin: Negative for color change and worsening wound.  Neurological: Negative for tremors and numbness other than noted  Psychiatric/Behavioral: Negative for decreased concentration or agitation other than above       Objective:   Physical Exam BP 162/90 mmHg  Pulse 114  Temp(Src) 98.6 F (37 C) (Oral)  Ht 5\' 9"  (1.753 m)  Wt 247 lb (112.038 kg)  BMI 36.46 kg/m2  SpO2 95% VS noted, mild ill  Constitutional: Pt appears in no significant distress HENT: Head: NCAT.  Right Ear: External ear normal.  Left Ear: External ear normal.  Bilat tm's with mild erythema.  Max sinus areas mild tender.  Pharynx with mild erythema, no exudate Eyes: . Pupils are equal, round, and reactive to light. Conjunctivae and EOM are normal Neck: Normal range of motion. Neck supple. with bilat submandib LA Cardiovascular: Normal rate and regular rhythm.   Pulmonary/Chest: Effort normal and breath sounds without rales or wheezing.  Abd:  Soft, NT, Neurological: Pt is alert. Not confused , motor grossly intact Skin: Skin is warm. No rash, no LE edema Psychiatric: Pt behavior is normal. No agitation. but 1-2+ anxious        Assessment & Plan:

## 2015-08-08 NOTE — Assessment & Plan Note (Signed)
Mild to mod, for antibx course,  to f/u any worsening symptoms or concerns 

## 2015-08-08 NOTE — Assessment & Plan Note (Signed)
stable overall by history and exam, recent data reviewed with pt, and pt to continue medical treatment as before,  to f/u any worsening symptoms or concerns Lab Results  Component Value Date   WBC 9.4 03/19/2015   HGB 14.4 03/19/2015   HCT 42.6 03/19/2015   PLT 183.0 03/19/2015   GLUCOSE 112* 03/19/2015   CHOL 170 03/19/2015   TRIG 127.0 03/19/2015   HDL 66.30 03/19/2015   LDLDIRECT 126.0 10/22/2010   LDLCALC 79 03/19/2015   ALT 17 03/19/2015   AST 19 03/19/2015   NA 140 03/19/2015   K 3.7 03/19/2015   CL 102 03/19/2015   CREATININE 0.80 03/19/2015   BUN 17 03/19/2015   CO2 27 03/19/2015   TSH 2.61 03/19/2015   HGBA1C 5.9 03/19/2015   For med refill

## 2015-08-08 NOTE — Progress Notes (Signed)
Pre visit review using our clinic review tool, if applicable. No additional management support is needed unless otherwise documented below in the visit note. 

## 2015-08-19 ENCOUNTER — Encounter: Payer: Self-pay | Admitting: Internal Medicine

## 2015-08-19 DIAGNOSIS — J069 Acute upper respiratory infection, unspecified: Secondary | ICD-10-CM

## 2015-08-19 MED ORDER — AMOXICILLIN 500 MG PO CAPS: 500.0000 mg | ORAL_CAPSULE | Freq: Three times a day (TID) | ORAL | Status: DC

## 2015-08-19 NOTE — Addendum Note (Signed)
Addended by: Lyman Bishop on: 08/19/2015 11:31 AM   Modules accepted: Orders

## 2015-10-05 ENCOUNTER — Encounter: Payer: Self-pay | Admitting: Internal Medicine

## 2015-10-06 DIAGNOSIS — J3089 Other allergic rhinitis: Secondary | ICD-10-CM | POA: Diagnosis not present

## 2015-10-06 DIAGNOSIS — R011 Cardiac murmur, unspecified: Secondary | ICD-10-CM | POA: Diagnosis not present

## 2015-10-06 DIAGNOSIS — Z720 Tobacco use: Secondary | ICD-10-CM | POA: Diagnosis not present

## 2015-10-06 DIAGNOSIS — J019 Acute sinusitis, unspecified: Secondary | ICD-10-CM | POA: Diagnosis not present

## 2015-10-06 DIAGNOSIS — J069 Acute upper respiratory infection, unspecified: Secondary | ICD-10-CM | POA: Diagnosis not present

## 2015-10-13 ENCOUNTER — Encounter: Payer: Self-pay | Admitting: Internal Medicine

## 2015-11-07 ENCOUNTER — Other Ambulatory Visit: Payer: Self-pay | Admitting: Internal Medicine

## 2015-11-30 ENCOUNTER — Emergency Department (HOSPITAL_COMMUNITY): Payer: Medicare Other

## 2015-11-30 ENCOUNTER — Encounter (HOSPITAL_COMMUNITY): Payer: Self-pay | Admitting: Family Medicine

## 2015-11-30 ENCOUNTER — Emergency Department (HOSPITAL_COMMUNITY)
Admission: EM | Admit: 2015-11-30 | Discharge: 2015-11-30 | Disposition: A | Discharge status: Home / Self Care | Payer: Medicare Other | Attending: Emergency Medicine | Admitting: Emergency Medicine

## 2015-11-30 ENCOUNTER — Ambulatory Visit (HOSPITAL_COMMUNITY)
Admission: EM | Admit: 2015-11-30 | Discharge: 2015-11-30 | Discharge status: Absent without leave | Payer: Medicare Other

## 2015-11-30 DIAGNOSIS — F17200 Nicotine dependence, unspecified, uncomplicated: Secondary | ICD-10-CM | POA: Insufficient documentation

## 2015-11-30 DIAGNOSIS — W5501XA Bitten by cat, initial encounter: Secondary | ICD-10-CM | POA: Diagnosis not present

## 2015-11-30 DIAGNOSIS — I10 Essential (primary) hypertension: Secondary | ICD-10-CM | POA: Insufficient documentation

## 2015-11-30 DIAGNOSIS — Y998 Other external cause status: Secondary | ICD-10-CM | POA: Diagnosis not present

## 2015-11-30 DIAGNOSIS — S61250A Open bite of right index finger without damage to nail, initial encounter: Secondary | ICD-10-CM | POA: Diagnosis not present

## 2015-11-30 DIAGNOSIS — Y9289 Other specified places as the place of occurrence of the external cause: Secondary | ICD-10-CM | POA: Insufficient documentation

## 2015-11-30 DIAGNOSIS — J45909 Unspecified asthma, uncomplicated: Secondary | ICD-10-CM | POA: Diagnosis not present

## 2015-11-30 DIAGNOSIS — Z7982 Long term (current) use of aspirin: Secondary | ICD-10-CM | POA: Diagnosis not present

## 2015-11-30 DIAGNOSIS — E785 Hyperlipidemia, unspecified: Secondary | ICD-10-CM | POA: Diagnosis not present

## 2015-11-30 DIAGNOSIS — Z792 Long term (current) use of antibiotics: Secondary | ICD-10-CM | POA: Diagnosis not present

## 2015-11-30 DIAGNOSIS — S61230A Puncture wound without foreign body of right index finger without damage to nail, initial encounter: Secondary | ICD-10-CM | POA: Insufficient documentation

## 2015-11-30 DIAGNOSIS — Y9389 Activity, other specified: Secondary | ICD-10-CM | POA: Diagnosis not present

## 2015-11-30 DIAGNOSIS — Z79899 Other long term (current) drug therapy: Secondary | ICD-10-CM | POA: Insufficient documentation

## 2015-11-30 DIAGNOSIS — Z8639 Personal history of other endocrine, nutritional and metabolic disease: Secondary | ICD-10-CM | POA: Insufficient documentation

## 2015-11-30 DIAGNOSIS — Z8742 Personal history of other diseases of the female genital tract: Secondary | ICD-10-CM | POA: Diagnosis not present

## 2015-11-30 DIAGNOSIS — F419 Anxiety disorder, unspecified: Secondary | ICD-10-CM | POA: Diagnosis not present

## 2015-11-30 DIAGNOSIS — F329 Major depressive disorder, single episode, unspecified: Secondary | ICD-10-CM | POA: Diagnosis not present

## 2015-11-30 DIAGNOSIS — Z23 Encounter for immunization: Secondary | ICD-10-CM | POA: Diagnosis not present

## 2015-11-30 DIAGNOSIS — M1990 Unspecified osteoarthritis, unspecified site: Secondary | ICD-10-CM | POA: Insufficient documentation

## 2015-11-30 MED ORDER — HYDROCODONE-ACETAMINOPHEN 5-325 MG PO TABS: 1.0000 | ORAL_TABLET | Freq: Four times a day (QID) | ORAL | Status: DC | PRN

## 2015-11-30 MED ORDER — TETANUS-DIPHTH-ACELL PERTUSSIS 5-2.5-18.5 LF-MCG/0.5 IM SUSP
0.5000 mL | Freq: Once | INTRAMUSCULAR | Status: AC
  Administered 2015-11-30: 0.5 mL via INTRAMUSCULAR
  Filled 2015-11-30: qty 0.5

## 2015-11-30 MED ORDER — AMOXICILLIN-POT CLAVULANATE 875-125 MG PO TABS: 1.0000 | ORAL_TABLET | Freq: Two times a day (BID) | ORAL | Status: DC

## 2015-11-30 MED ORDER — OXYCODONE-ACETAMINOPHEN 5-325 MG PO TABS
1.0000 | ORAL_TABLET | Freq: Once | ORAL | Status: AC
  Administered 2015-11-30: 1 via ORAL
  Filled 2015-11-30: qty 1

## 2015-11-30 MED ORDER — AMOXICILLIN-POT CLAVULANATE 875-125 MG PO TABS
1.0000 | ORAL_TABLET | Freq: Once | ORAL | Status: AC
  Administered 2015-11-30: 1 via ORAL
  Filled 2015-11-30: qty 1

## 2015-11-30 NOTE — ED Notes (Signed)
Declined W/C at D/C and was escorted to lobby by RN. 

## 2015-11-30 NOTE — ED Notes (Signed)
Pt here for cat bite to right hand. sts her cat and unsure if rabies shot. Pt hand swollen

## 2015-11-30 NOTE — Discharge Instructions (Signed)

## 2015-11-30 NOTE — ED Provider Notes (Signed)
CSN: [id number]     Arrival date & time 11/30/15  1316 History   By signing my name below, I, Desiree Higgins, attest that this documentation has been prepared under the direction and in the presence of Desiree Gip, PA-C Electronically Signed: Soijett Higgins, ED Scribe. 11/30/2015. 2:36 PM.   Chief Complaint  Patient presents with  . Animal Bite   Patient is a 70 y.o. female presenting with animal bite. The history is provided by the patient. No language interpreter was used.  Animal Bite Contact animal:  Cat Location:  Finger Finger injury location:  R index finger Time since incident:  1 day Incident location:  Home Provoked: unprovoked   Notifications:  None Animal's rabies vaccination status:  Out of date Animal in possession: yes   Tetanus status:  Out of date Relieved by:  None tried Ineffective treatments:  None tried Associated symptoms: swelling   Associated symptoms: no fever and no numbness     Desiree Higgins is a 70 y.o. female who presents to the Emergency Department complaining of an animal bite onset yesterday morning. Pt notes that she was bitten by her cat on her right hand.The bite occurred yesterday morning. The cat bit her on her right index finger. Patient reports bleeding at the time which was stopped with pressure and a scab has formed. She reports cleaning the area with water after the bite happened but did not bandage it. Pt notes that the cat is not UTD on shots and hasn't had any shots for the past 7 years. She does have the cat in her possession. Pt is having associated symptoms of right finger swelling with redness. She denies loss of range of motion of the finger. Pt reports that she has not tried any medications for the relief of her symptoms. Pt denies fever, chills, dizziness, syncope, nausea, vomiting, numbness, tingling, and any other symptoms.   Past Medical History  Diagnosis Date  . HYPERTENSION 09/22/2007    Qualifier: Diagnosis of  By: Jenny Reichmann MD, Hunt Oris    . HYPERLIPIDEMIA 09/22/2007    Qualifier: Diagnosis of  By: Jenny Reichmann MD, Hunt Oris   . DEPRESSION 09/22/2007    Qualifier: Diagnosis of  By: Jenny Reichmann MD, Hunt Oris   . ANXIETY 09/22/2007    Qualifier: Diagnosis of  By: Jenny Reichmann MD, Hunt Oris   . ALLERGIC RHINITIS 09/22/2007    Qualifier: Diagnosis of  By: Jenny Reichmann MD, Hunt Oris   . VITAMIN D DEFICIENCY 06/04/2010    Qualifier: Diagnosis of  By: Jenny Reichmann MD, [place] BLADDER 09/22/2007    Qualifier: Diagnosis of  By: Jenny Reichmann MD, Mora Bellman, KNEE 11/21/2008    Qualifier: Diagnosis of  By: Jenny Reichmann MD, Hunt Oris   . Impaired glucose tolerance 12/19/2012  . Unspecified asthma(493.90) 12/26/2013  . Smoker 12/26/2013   Past Surgical History  Procedure Laterality Date  . Cataract extraction      right  . Abdominal hysterectomy    . Oophorectomy    . Cholecystectomy    . Tonsillectomy    . Breast biopsy  1972   Family History  Problem Relation Age of Onset  . Cancer Mother     breast   Social History  Substance Use Topics  . Smoking status: Current Some Day Smoker  . Smokeless tobacco: Never Used  . Alcohol Use: No   OB History    No data available     Review of Systems  Constitutional:  Negative for fever.  Musculoskeletal: Positive for joint swelling and arthralgias.  Skin: Positive for color change.  Neurological: Negative for numbness.       No tingling  All other systems reviewed and are negative.     Allergies  Ace inhibitors  Home Medications   Prior to Admission medications   Medication Sig Start Date End Date Taking? Authorizing Provider  albuterol (PROAIR HFA) 108 (90 BASE) MCG/ACT inhaler INHALE 2 PUFFS 4 TIMES A DAY AS NEEDED 03/19/15   Biagio Borg, MD  amoxicillin (AMOXIL) 500 MG capsule Take 1 capsule (500 mg total) by mouth 3 (three) times daily. 08/19/15   Biagio Borg, MD  amoxicillin-clavulanate (AUGMENTIN) 875-125 MG tablet Take 1 tablet by mouth every 12 (twelve) hours. 11/30/15   Delmy Holdren, PA-C   aspirin 81 MG EC tablet Take 81 mg by mouth daily. Swallow whole.    Historical Provider, MD  cholestyramine light (PREVALITE) 4 G packet Take 1 packet (4 g total) by mouth 2 (two) times daily. 03/19/15   Biagio Borg, MD  clonazePAM (KLONOPIN) 0.5 MG tablet Take 1 tablet (0.5 mg total) by mouth 2 (two) times daily. As needed 08/08/15   Biagio Borg, MD  DULoxetine (CYMBALTA) 60 MG capsule Take 1 capsule (60 mg total) by mouth daily. 03/19/15   Biagio Borg, MD  HYDROcodone-acetaminophen (NORCO/VICODIN) 5-325 MG tablet Take 1 tablet by mouth every 6 (six) hours as needed. 11/30/15   Yasmyn Bellisario, PA-C  HYDROcodone-homatropine (HYCODAN) 5-1.5 MG/5ML syrup Take 5 mLs by mouth every 6 (six) hours as needed for cough. 08/08/15   Biagio Borg, MD  levofloxacin (LEVAQUIN) 250 MG tablet Take 1 tablet (250 mg total) by mouth daily. 08/08/15   Biagio Borg, MD  losartan-hydrochlorothiazide (HYZAAR) 100-25 MG per tablet Take 1 tablet by mouth daily. 04/16/15   Biagio Borg, MD  montelukast (SINGULAIR) 10 MG tablet TAKE ONE TABLET BY MOUTH AT BEDTIME 11/07/15   Biagio Borg, MD  oxyCODONE-acetaminophen (PERCOCET) 10-325 MG per tablet Take 1 tablet by mouth every 4 (four) hours as needed.    Historical Provider, MD  simvastatin (ZOCOR) 40 MG tablet Take 1 tablet (40 mg total) by mouth daily. 03/19/15   Biagio Borg, MD  triamcinolone (NASACORT) 55 MCG/ACT nasal inhaler Place 2 sprays into the nose daily. Patient not taking: Reported on 08/08/2015 12/19/12   Biagio Borg, MD   BP 147/73 mmHg  Pulse 104  Temp(Src) 97.9 F (36.6 C) (Oral)  Resp 18  Ht 5\' 9"  (1.753 m)  Wt 107.502 kg  BMI 34.98 kg/m2  SpO2 96% Physical Exam  Constitutional: She appears well-developed and well-nourished. No distress.  HENT:  Head: Normocephalic and atraumatic.  Right Ear: External ear normal.  Left Ear: External ear normal.  Eyes: Conjunctivae are normal. Right eye exhibits no discharge. Left eye exhibits no discharge. No  scleral icterus.  Neck: Normal range of motion.  Cardiovascular: Normal rate and intact distal pulses.   Pulmonary/Chest: Effort normal.  Musculoskeletal: Normal range of motion.  5 mm puncture wound noted to right dorsal second digit over the proximal phalanx. Wound is already in early healing stages and has hemostatic scab. Surrounding erythema of approximately 2 cm without streaking. No warmth. Mild swelling of second digit at site of bite. No swelling of the distal digit or hand. FROM intact. 5/5 grip strength. Sensation intact distally. Pt able to make a tight fist. Cap refill less than 3 seconds.  Neurological: She is alert. Coordination normal.  Skin: Skin is warm and dry.  Psychiatric: She has a normal mood and affect. Her behavior is normal.  Nursing note and vitals reviewed.   ED Course  Procedures (including critical care time) DIAGNOSTIC STUDIES: Oxygen Saturation is 96% on RA, nl by my interpretation.    COORDINATION OF CARE: 2:36 PM Discussed treatment plan with pt at bedside which includes right hand xray and abx and pt agreed to plan.    Labs Review Labs Reviewed - No data to display  Imaging Review Dg Hand Complete Right  11/30/2015  CLINICAL DATA:  Cat bite to right hand, puncture wound along the posterior surface of the index finger EXAM: RIGHT HAND - COMPLETE 3+ VIEW COMPARISON:  None. FINDINGS: No fracture or dislocation is seen. The joint spaces are preserved. Mild soft tissue swelling overlying the dorsum of the hand, at the level of the MCP joints. No radiopaque foreign body is seen. IMPRESSION: No fracture, dislocation, or radiopaque foreign body is seen. Mild soft tissue swelling. Electronically Signed   By: Julian Hy M.D.   On: 11/30/2015 14:51   I have personally reviewed and evaluated these images as part of my medical decision-making.   EKG Interpretation None      MDM   Final diagnoses:  Cat bite, initial encounter   Desiree Higgins presents  with a cat bite to the right index finger. Bite occurred more than 24 hours ago. Pt Alert and oriented, NAD, nontoxic, nonseptic appearing. Capillary refill intact and pt without neurologic deficit of the right 2nd digit.  Right hand x-rays with mild soft tissue swelling and no fracture or foreign body.  Patient's tetanus updated. The animal is in the possession of the patient and will be taken to the vet for quarantine. Rabies vaccine and immunoglobulin not indicated at this time pending observation of the animal. Pain treated in the emergency department with percocet. We'll discharge home with pain medication, Augmentin and requests for close follow-up with PCP, urgent care or ED in two days for a wound check. Discussed signs of infection and other reasons to return to the ED sooner.    I personally performed the services described in this documentation, which was scribed in my presence. The recorded information has been reviewed and is accurate.    Lahoma Crocker Tyonna Talerico, PA-C 11/30/15 [place], MD 12/04/15 [phone]

## 2015-11-30 NOTE — ED Notes (Signed)
PT reports the cats last rabies shot was 7 years ago.

## 2015-12-03 ENCOUNTER — Encounter: Payer: Self-pay | Admitting: Internal Medicine

## 2015-12-03 ENCOUNTER — Ambulatory Visit (INDEPENDENT_AMBULATORY_CARE_PROVIDER_SITE_OTHER): Payer: Medicare Other | Admitting: Internal Medicine

## 2015-12-03 VITALS — BP 122/80 | HR 92 | Temp 97.4°F | Resp 20 | Wt 234.0 lb

## 2015-12-03 DIAGNOSIS — W5501XD Bitten by cat, subsequent encounter: Secondary | ICD-10-CM | POA: Diagnosis not present

## 2015-12-03 DIAGNOSIS — I10 Essential (primary) hypertension: Secondary | ICD-10-CM | POA: Diagnosis not present

## 2015-12-03 DIAGNOSIS — J4520 Mild intermittent asthma, uncomplicated: Secondary | ICD-10-CM

## 2015-12-03 DIAGNOSIS — J309 Allergic rhinitis, unspecified: Secondary | ICD-10-CM

## 2015-12-03 NOTE — Patient Instructions (Signed)
Please continue all other medications as before, and refills have been done if requested.  Please have the pharmacy call with any other refills you may need.  Please keep your appointments with your specialists as you may have planned  Please return in 3 months, or sooner if needed

## 2015-12-03 NOTE — Progress Notes (Signed)
Pre visit review using our clinic review tool, if applicable. No additional management support is needed unless otherwise documented below in the visit note. 

## 2015-12-03 NOTE — Progress Notes (Signed)
Subjective:    Patient ID: Desiree Higgins, female    DOB: 1945-10-02, 70 y.o.   MRN: [id number]  HPI  Here for f/ju cat bite index finger right hand apr 23, Seen at UC with site cellulitis extending to the 3rd and 4th mcp areas as well, no red streaks or abscess, tx with augmentin course, tetanus updated,  Has had good compliance and tolerance augmentin, today doing very well with only milld swelling residual, erythema essentially resolved, no drainage or other worsening signs.  Cat doing well.  Also 3 mo nonprod cough, ? Viral illness to start, then Does have several wks ongoing nasal allergy symptoms with clearish congestion, itch and sneezing, without fever, pain, ST, cough, swelling or wheezing.  Plans to consider re-starting nasaocort Past Medical History  Diagnosis Date  . HYPERTENSION 09/22/2007    Qualifier: Diagnosis of  By: Jenny Reichmann MD, Hunt Oris   . HYPERLIPIDEMIA 09/22/2007    Qualifier: Diagnosis of  By: Jenny Reichmann MD, Hunt Oris   . DEPRESSION 09/22/2007    Qualifier: Diagnosis of  By: Jenny Reichmann MD, Hunt Oris   . ANXIETY 09/22/2007    Qualifier: Diagnosis of  By: Jenny Reichmann MD, Hunt Oris   . ALLERGIC RHINITIS 09/22/2007    Qualifier: Diagnosis of  By: Jenny Reichmann MD, Hunt Oris   . VITAMIN D DEFICIENCY 06/04/2010    Qualifier: Diagnosis of  By: Jenny Reichmann MD, [place] BLADDER 09/22/2007    Qualifier: Diagnosis of  By: Jenny Reichmann MD, Mora Bellman, KNEE 11/21/2008    Qualifier: Diagnosis of  By: Jenny Reichmann MD, Hunt Oris   . Impaired glucose tolerance 12/19/2012  . Unspecified asthma(493.90) 12/26/2013  . Smoker 12/26/2013   Past Surgical History  Procedure Laterality Date  . Cataract extraction      right  . Abdominal hysterectomy    . Oophorectomy    . Cholecystectomy    . Tonsillectomy    . Breast biopsy  1972    reports that she has been smoking.  She has never used smokeless tobacco. She reports that she does not drink alcohol or use illicit drugs. family history includes Cancer in her mother. Allergies    Allergen Reactions  . Ace Inhibitors     REACTION: cough   Current Outpatient Prescriptions on File Prior to Visit  Medication Sig Dispense Refill  . albuterol (PROAIR HFA) 108 (90 BASE) MCG/ACT inhaler INHALE 2 PUFFS 4 TIMES A DAY AS NEEDED 25.5 each 3  . amoxicillin-clavulanate (AUGMENTIN) 875-125 MG tablet Take 1 tablet by mouth every 12 (twelve) hours. 20 tablet 0  . aspirin 81 MG EC tablet Take 81 mg by mouth daily. Swallow whole.    . cholestyramine light (PREVALITE) 4 G packet Take 1 packet (4 g total) by mouth 2 (two) times daily. 180 packet 3  . clonazePAM (KLONOPIN) 0.5 MG tablet Take 1 tablet (0.5 mg total) by mouth 2 (two) times daily. As needed 60 tablet 2  . DULoxetine (CYMBALTA) 60 MG capsule Take 1 capsule (60 mg total) by mouth daily. 90 capsule 3  . losartan-hydrochlorothiazide (HYZAAR) 100-25 MG per tablet Take 1 tablet by mouth daily. 90 tablet 3  . montelukast (SINGULAIR) 10 MG tablet TAKE ONE TABLET BY MOUTH AT BEDTIME 90 tablet 0  . oxyCODONE-acetaminophen (PERCOCET) 10-325 MG per tablet Take 1 tablet by mouth every 4 (four) hours as needed.    . simvastatin (ZOCOR) 40 MG tablet Take 1 tablet (40 mg total) by mouth  daily. 90 tablet 3  . triamcinolone (NASACORT) 55 MCG/ACT nasal inhaler Place 2 sprays into the nose daily. 1 Inhaler 12   No current facility-administered medications on file prior to visit.    Review of Systems  Constitutional: Negative for unusual diaphoresis or night sweats HENT: Negative for ear swelling or discharge Eyes: Negative for worsening visual haziness  Respiratory: Negative for choking and stridor.   Gastrointestinal: Negative for distension or worsening eructation Genitourinary: Negative for retention or change in urine volume.  Musculoskeletal: Negative for other MSK pain or swelling Skin: Negative for color change and worsening wound Neurological: Negative for tremors and numbness other than noted  Psychiatric/Behavioral: Negative  for decreased concentration or agitation other than above       Objective:   Physical Exam BP 122/80 mmHg  Pulse 92  Temp(Src) 97.4 F (36.3 C) (Oral)  Resp 20  Wt 234 lb (106.142 kg)  SpO2 98% VS noted,  Constitutional: Pt appears in no apparent distress HENT: Head: NCAT.  Right Ear: External ear normal.  Left Ear: External ear normal.  Eyes: . Pupils are equal, round, and reactive to light. Conjunctivae and EOM are normal Neck: Normal range of motion. Neck supple.  Cardiovascular: Normal rate and regular rhythm.   Pulmonary/Chest: Effort normal and breath sounds without rales or wheezing.  Neurological: Pt is alert. Not confused , motor grossly intact Skin: Skin is warm. No rash, no LE edema Psychiatric: Pt behavior is normal. No agitation.  Finger with mild swelling only, no erythema, drainage or open wound       Assessment & Plan:

## 2015-12-07 DIAGNOSIS — W5501XA Bitten by cat, initial encounter: Secondary | ICD-10-CM | POA: Insufficient documentation

## 2015-12-07 NOTE — Assessment & Plan Note (Signed)
stable overall by history and exam, recent data reviewed with pt, and pt to continue medical treatment as before,  to f/u any worsening symptoms or concerns SpO2 Readings from Last 3 Encounters:  12/03/15 98%  11/30/15 96%  08/08/15 95%

## 2015-12-07 NOTE — Assessment & Plan Note (Signed)
Mild, to finishe antibx course,  to f/u any worsening symptoms or concerns

## 2015-12-07 NOTE — Assessment & Plan Note (Signed)
Mild seasonal flare,  Mild to mod, for nasacort asd,  to f/u any worsening symptoms or concerns

## 2015-12-07 NOTE — Assessment & Plan Note (Signed)
stable overall by history and exam, recent data reviewed with pt, and pt to continue medical treatment as before,  to f/u any worsening symptoms or concerns BP Readings from Last 3 Encounters:  12/03/15 122/80  11/30/15 147/73  08/08/15 162/90

## 2015-12-15 ENCOUNTER — Other Ambulatory Visit: Payer: Self-pay | Admitting: Internal Medicine

## 2015-12-16 ENCOUNTER — Encounter: Payer: Self-pay | Admitting: Internal Medicine

## 2015-12-16 NOTE — Telephone Encounter (Signed)
Done hardcopy to Corinne  

## 2015-12-16 NOTE — Telephone Encounter (Signed)
Medication faxed to pharmacy 

## 2015-12-16 NOTE — Telephone Encounter (Signed)
Corinne to make sure please rx is ready for pickup at the front, thanks

## 2015-12-16 NOTE — Telephone Encounter (Signed)
Please advise 

## 2016-02-06 ENCOUNTER — Other Ambulatory Visit: Payer: Self-pay | Admitting: Internal Medicine

## 2016-02-18 ENCOUNTER — Encounter: Payer: Self-pay | Admitting: Internal Medicine

## 2016-02-18 ENCOUNTER — Other Ambulatory Visit: Payer: Self-pay | Admitting: Internal Medicine

## 2016-02-18 MED ORDER — AMOXICILLIN-POT CLAVULANATE 875-125 MG PO TABS: 1.0000 | ORAL_TABLET | Freq: Two times a day (BID) | ORAL | Status: DC

## 2016-02-25 ENCOUNTER — Other Ambulatory Visit (INDEPENDENT_AMBULATORY_CARE_PROVIDER_SITE_OTHER): Payer: Medicare Other

## 2016-02-25 ENCOUNTER — Encounter: Payer: Self-pay | Admitting: Internal Medicine

## 2016-02-25 ENCOUNTER — Ambulatory Visit (INDEPENDENT_AMBULATORY_CARE_PROVIDER_SITE_OTHER): Payer: Medicare Other | Admitting: Internal Medicine

## 2016-02-25 VITALS — BP 140/80 | HR 107 | Temp 98.0°F | Resp 20 | Wt 235.0 lb

## 2016-02-25 DIAGNOSIS — G8929 Other chronic pain: Secondary | ICD-10-CM | POA: Insufficient documentation

## 2016-02-25 DIAGNOSIS — E785 Hyperlipidemia, unspecified: Secondary | ICD-10-CM | POA: Diagnosis not present

## 2016-02-25 DIAGNOSIS — R7302 Impaired glucose tolerance (oral): Secondary | ICD-10-CM

## 2016-02-25 DIAGNOSIS — Z1159 Encounter for screening for other viral diseases: Secondary | ICD-10-CM

## 2016-02-25 DIAGNOSIS — M545 Low back pain: Secondary | ICD-10-CM | POA: Diagnosis not present

## 2016-02-25 DIAGNOSIS — J069 Acute upper respiratory infection, unspecified: Secondary | ICD-10-CM | POA: Insufficient documentation

## 2016-02-25 DIAGNOSIS — I10 Essential (primary) hypertension: Secondary | ICD-10-CM

## 2016-02-25 DIAGNOSIS — E559 Vitamin D deficiency, unspecified: Secondary | ICD-10-CM | POA: Diagnosis not present

## 2016-02-25 LAB — URINALYSIS, ROUTINE W REFLEX MICROSCOPIC
Bilirubin Urine: NEGATIVE
Ketones, ur: NEGATIVE
LEUKOCYTES UA: NEGATIVE
Nitrite: NEGATIVE
PH: 6 (ref 5.0–8.0)
SPECIFIC GRAVITY, URINE: 1.01 (ref 1.000–1.030)
TOTAL PROTEIN, URINE-UPE24: NEGATIVE
Urine Glucose: NEGATIVE
Urobilinogen, UA: 0.2 (ref 0.0–1.0)

## 2016-02-25 LAB — BASIC METABOLIC PANEL
BUN: 19 mg/dL (ref 6–23)
CO2: 25 meq/L (ref 19–32)
Calcium: 9.6 mg/dL (ref 8.4–10.5)
Chloride: 100 mEq/L (ref 96–112)
Creatinine, Ser: 0.99 mg/dL (ref 0.40–1.20)
GFR: 58.63 mL/min — AB (ref >60.00)
GLUCOSE: 125 mg/dL — AB (ref 70–99)
POTASSIUM: 3.5 meq/L (ref 3.5–5.1)
SODIUM: 135 meq/L (ref 135–145)

## 2016-02-25 LAB — HEPATIC FUNCTION PANEL
ALBUMIN: 3.3 g/dL — AB (ref 3.5–5.2)
ALK PHOS: 107 U/L (ref 39–117)
ALT: 17 U/L (ref 0–35)
AST: 17 U/L (ref 0–37)
BILIRUBIN DIRECT: 0.2 mg/dL (ref 0.0–0.3)
Total Bilirubin: 0.7 mg/dL (ref 0.2–1.2)
Total Protein: 8 g/dL (ref 6.0–8.3)

## 2016-02-25 LAB — TSH: TSH: 2.66 u[IU]/mL (ref 0.35–4.50)

## 2016-02-25 LAB — CBC WITH DIFFERENTIAL/PLATELET
Basophils Absolute: 0.1 10*3/uL (ref 0.0–0.1)
Basophils Relative: 0.7 % (ref 0.0–3.0)
EOS PCT: 4.2 % (ref 0.0–5.0)
Eosinophils Absolute: 0.6 10*3/uL (ref 0.0–0.7)
HCT: 36 % (ref 36.0–46.0)
Hemoglobin: 12 g/dL (ref 12.0–15.0)
LYMPHS ABS: 3.5 10*3/uL (ref 0.7–4.0)
Lymphocytes Relative: 25.6 % (ref 12.0–46.0)
MCHC: 33.4 g/dL (ref 30.0–36.0)
MCV: 87.9 fl (ref 78.0–100.0)
MONO ABS: 0.9 10*3/uL (ref 0.1–1.0)
Monocytes Relative: 6.6 % (ref 3.0–12.0)
NEUTROS ABS: 8.6 10*3/uL — AB (ref 1.4–7.7)
NEUTROS PCT: 62.9 % (ref 43.0–77.0)
PLATELETS: 358 10*3/uL (ref 150.0–400.0)
RBC: 4.09 Mil/uL (ref 3.87–5.11)
RDW: 15.9 % — AB (ref 11.5–15.5)
WBC: 13.7 10*3/uL — ABNORMAL HIGH (ref 4.0–10.5)

## 2016-02-25 LAB — LIPID PANEL
CHOLESTEROL: 130 mg/dL (ref 0–200)
HDL: 41.1 mg/dL (ref >39.00)
LDL Cholesterol: 70 mg/dL (ref 0–99)
NonHDL: 89.08
Total CHOL/HDL Ratio: 3
Triglycerides: 93 mg/dL (ref 0.0–149.0)
VLDL: 18.6 mg/dL (ref 0.0–40.0)

## 2016-02-25 LAB — VITAMIN D 25 HYDROXY (VIT D DEFICIENCY, FRACTURES): VITD: 32.01 ng/mL (ref 30.00–100.00)

## 2016-02-25 LAB — HEMOGLOBIN A1C: HEMOGLOBIN A1C: 5.6 % (ref 4.6–6.5)

## 2016-02-25 MED ORDER — PREDNISONE 10 MG PO TABS: ORAL_TABLET | ORAL | Status: DC

## 2016-02-25 MED ORDER — AZITHROMYCIN 250 MG PO TABS: ORAL_TABLET | ORAL | Status: DC

## 2016-02-25 NOTE — Assessment & Plan Note (Signed)
Ok for predpac asd, o/w stable

## 2016-02-25 NOTE — Progress Notes (Signed)
Pre visit review using our clinic review tool, if applicable. No additional management support is needed unless otherwise documented below in the visit note. 

## 2016-02-25 NOTE — Assessment & Plan Note (Signed)
Mild to mod, for antibx course zpack,  to f/u any worsening symptoms or concerns 

## 2016-02-25 NOTE — Assessment & Plan Note (Signed)
stable overall by history and exam, recent data reviewed with pt, and pt to continue medical treatment as before,  to f/u any worsening symptoms or concerns Lab Results  Component Value Date   LDLCALC 79 03/19/2015

## 2016-02-25 NOTE — Assessment & Plan Note (Signed)
Has been taking 2000 units per day, will re-check

## 2016-02-25 NOTE — Progress Notes (Signed)
Subjective:    Patient ID: Desiree Higgins, female    DOB: 10-10-1945, 70 y.o.   MRN: [id number]  HPI   Here for yearly f/u;  Overall doing ok;  Pt denies Chest pain, worsening SOB, DOE, wheezing, orthopnea, PND, worsening LE edema, palpitations, dizziness or syncope.  Pt denies neurological change such as new headache, facial or extremity weakness.  Pt denies polydipsia, polyuria, or low sugar symptoms. Pt states overall good compliance with treatment and medications, good tolerability, and has been trying to follow appropriate diet.  Pt denies worsening depressive symptoms, suicidal ideation or panic. No fever, night sweats, wt loss, loss of appetite, or other constitutional symptoms.  Pt states good ability with ADL's, has low fall risk, home safety reviewed and adequate, no other significant changes in hearing or vision, and only occasionally active with exercise  .  Here with 2-3 days acute onset fever, facial pain, pressure, headache, general weakness and malaise, and greenish d/c, with mild ST and non prod cough.  Had a fall with spinal fx now healed but Pt continues to have recurring LBP  With spasms that sometimes are debilitating,, but now bowel or bladder change, fever, wt loss,  worsening LE pain/numbness/weakness, gait change or falls.  Husband very supportive.Nothing else to be done per ortho, has finished PT.  Limited quite a bit in mobility, has a back brace, can vacuum a little bit.  Situation seems to make depression worse.  Denies worsening depressive symptoms, suicidal ideation, or panic.  Just "feel sorry for myself."   Asks for predpac as this really helped for a short while several months ago per ortho  Has low vit d last aug 2017 Past Medical History  Diagnosis Date  . HYPERTENSION 09/22/2007    Qualifier: Diagnosis of  By: Jenny Reichmann MD, Hunt Oris   . HYPERLIPIDEMIA 09/22/2007    Qualifier: Diagnosis of  By: Jenny Reichmann MD, Hunt Oris   . DEPRESSION 09/22/2007    Qualifier: Diagnosis of  By: Jenny Reichmann  MD, Hunt Oris   . ANXIETY 09/22/2007    Qualifier: Diagnosis of  By: Jenny Reichmann MD, Hunt Oris   . ALLERGIC RHINITIS 09/22/2007    Qualifier: Diagnosis of  By: Jenny Reichmann MD, Hunt Oris   . VITAMIN D DEFICIENCY 06/04/2010    Qualifier: Diagnosis of  By: Jenny Reichmann MD, [place] BLADDER 09/22/2007    Qualifier: Diagnosis of  By: Jenny Reichmann MD, Mora Bellman, KNEE 11/21/2008    Qualifier: Diagnosis of  By: Jenny Reichmann MD, Hunt Oris   . Impaired glucose tolerance 12/19/2012  . Unspecified asthma(493.90) 12/26/2013  . Smoker 12/26/2013   Past Surgical History  Procedure Laterality Date  . Cataract extraction      right  . Abdominal hysterectomy    . Oophorectomy    . Cholecystectomy    . Tonsillectomy    . Breast biopsy  1972    reports that she has been smoking.  She has never used smokeless tobacco. She reports that she does not drink alcohol or use illicit drugs. family history includes Cancer in her mother. Allergies  Allergen Reactions  . Ace Inhibitors     REACTION: cough   Current Outpatient Prescriptions on File Prior to Visit  Medication Sig Dispense Refill  . albuterol (PROAIR HFA) 108 (90 BASE) MCG/ACT inhaler INHALE 2 PUFFS 4 TIMES A DAY AS NEEDED 25.5 each 3  . amoxicillin-clavulanate (AUGMENTIN) 875-125 MG tablet Take 1 tablet by mouth every 12 (twelve)  hours. 20 tablet 0  . aspirin 81 MG EC tablet Take 81 mg by mouth daily. Swallow whole.    . cholestyramine light (PREVALITE) 4 G packet Take 1 packet (4 g total) by mouth 2 (two) times daily. 180 packet 3  . clonazePAM (KLONOPIN) 0.5 MG tablet TAKE ONE TABLET BY MOUTH TWICE DAILY AS NEEDED 60 tablet 2  . DULoxetine (CYMBALTA) 60 MG capsule Take 1 capsule (60 mg total) by mouth daily. 90 capsule 3  . losartan-hydrochlorothiazide (HYZAAR) 100-25 MG per tablet Take 1 tablet by mouth daily. 90 tablet 3  . montelukast (SINGULAIR) 10 MG tablet TAKE ONE TABLET BY MOUTH ONCE DAILY AT BEDTIME 90 tablet 0  . oxyCODONE-acetaminophen (PERCOCET)  10-325 MG per tablet Take 1 tablet by mouth every 4 (four) hours as needed.    . simvastatin (ZOCOR) 40 MG tablet Take 1 tablet (40 mg total) by mouth daily. 90 tablet 3  . triamcinolone (NASACORT) 55 MCG/ACT nasal inhaler Place 2 sprays into the nose daily. 1 Inhaler 12   No current facility-administered medications on file prior to visit.   Review of Systems Constitutional: Negative for increased diaphoresis, or other activity, appetite or siginficant weight change other than noted HENT: Negative for worsening hearing loss, ear pain, facial swelling, mouth sores and neck stiffness.   Eyes: Negative for other worsening pain, redness or visual disturbance.  Respiratory: Negative for choking or stridor Cardiovascular: Negative for other chest pain and palpitations.  Gastrointestinal: Negative for worsening diarrhea, blood in stool, or abdominal distention Genitourinary: Negative for hematuria, flank pain or change in urine volume.  Musculoskeletal: Negative for myalgias or other joint complaints.  Skin: Negative for other color change and wound or drainage.  Neurological: Negative for syncope and numbness. other than noted Hematological: Negative for adenopathy. or other swelling Psychiatric/Behavioral: Negative for hallucinations, SI, self-injury, decreased concentration or other worsening agitation.      Objective:   Physical Exam BP 140/80 mmHg  Pulse 107  Temp(Src) 98 F (36.7 C) (Oral)  Resp 20  Wt 235 lb (106.595 kg)  SpO2 96% VS noted,  Constitutional: Pt is oriented to person, place, and time. Appears well-developed and well-nourished, in no significant distress Head: Normocephalic and atraumatic  Eyes: Conjunctivae and EOM are normal. Pupils are equal, round, and reactive to light Right Ear: External ear normal.  Left Ear: External ear normal Nose: Nose normal.  Mouth/Throat: Oropharynx is clear and moist  Neck: Normal range of motion. Neck supple. No JVD present. No  tracheal deviation present or significant neck LA or mass Cardiovascular: Normal rate, regular rhythm, normal heart sounds and intact distal pulses.   Pulmonary/Chest: Effort normal and breath sounds without rales or wheezing  Abdominal: Soft. Bowel sounds are normal. NT. No HSM  Musculoskeletal: Normal range of motion. Exhibits no edema Lymphadenopathy: Has no cervical adenopathy.  Neurological: Pt is alert and oriented to person, place, and time. Pt has normal reflexes. No cranial nerve deficit. Motor grossly intact Spine with chronic low mid lumbar tender without swelling or erythema Skin: Skin is warm and dry. No rash noted or new ulcers Psychiatric:  Has depressed mood and affect. Behavior is normal.     Assessment & Plan:

## 2016-02-25 NOTE — Assessment & Plan Note (Signed)
stable overall by history and exam, recent data reviewed with pt, and pt to continue medical treatment as before,  to f/u any worsening symptoms or concerns BP Readings from Last 3 Encounters:  02/25/16 140/80  12/03/15 122/80  11/30/15 147/73

## 2016-02-25 NOTE — Patient Instructions (Signed)
Please take all new medication as prescribed - the antibiotic, and the prednisone  Please continue all other medications as before, and refills have been done if requested.  Please have the pharmacy call with any other refills you may need.  Please continue your efforts at being more active, low cholesterol diet, and weight control.  Please keep your appointments with your specialists as you may have planned  Please go to the LAB in the Basement (turn left off the elevator) for the tests to be done today  .[email or web address]  Please remember to sign up for MyChart if you have not done so, as this will be important to you in the future with finding out test results, communicating by private email, and scheduling acute appointments online when needed.  Please return in 6 months, or sooner if needed

## 2016-02-26 LAB — HEPATITIS C ANTIBODY: HCV AB: NEGATIVE

## 2016-03-08 ENCOUNTER — Encounter: Payer: Self-pay | Admitting: Internal Medicine

## 2016-03-10 ENCOUNTER — Encounter: Payer: Self-pay | Admitting: Internal Medicine

## 2016-03-10 NOTE — Telephone Encounter (Signed)
Routine refills to corinne or staff

## 2016-03-12 MED ORDER — CHOLESTYRAMINE LIGHT 4 G PO PACK: PACK | ORAL | 3 refills | Status: DC

## 2016-03-12 NOTE — Addendum Note (Signed)
Addended by: Earnstine Regal on: 03/12/2016 09:34 AM   Modules accepted: Orders

## 2016-03-15 ENCOUNTER — Other Ambulatory Visit: Payer: Self-pay | Admitting: Internal Medicine

## 2016-03-16 ENCOUNTER — Telehealth: Payer: Self-pay

## 2016-03-16 MED ORDER — DULOXETINE HCL 60 MG PO CPEP: 60.0000 mg | ORAL_CAPSULE | Freq: Every day | ORAL | 3 refills | Status: AC

## 2016-03-16 MED ORDER — LOSARTAN POTASSIUM-HCTZ 100-25 MG PO TABS: 1.0000 | ORAL_TABLET | Freq: Every day | ORAL | 3 refills | Status: AC

## 2016-03-16 MED ORDER — PREDNISONE 10 MG PO TABS: ORAL_TABLET | ORAL | 0 refills | Status: DC

## 2016-03-16 MED ORDER — SIMVASTATIN 40 MG PO TABS: 40.0000 mg | ORAL_TABLET | Freq: Every day | ORAL | 3 refills | Status: AC

## 2016-03-16 MED ORDER — ALBUTEROL SULFATE HFA 108 (90 BASE) MCG/ACT IN AERS: 2.0000 | INHALATION_SPRAY | Freq: Four times a day (QID) | RESPIRATORY_TRACT | 1 refills | Status: DC | PRN

## 2016-03-16 MED ORDER — CHOLESTYRAMINE LIGHT 4 G PO PACK: 4.0000 g | PACK | Freq: Two times a day (BID) | ORAL | 3 refills | Status: DC

## 2016-03-16 NOTE — Telephone Encounter (Signed)
Medication refills sent to pharmacy 

## 2016-04-13 ENCOUNTER — Other Ambulatory Visit: Payer: Self-pay | Admitting: Internal Medicine

## 2016-04-13 NOTE — Telephone Encounter (Signed)
faxed

## 2016-04-13 NOTE — Telephone Encounter (Signed)
Done hardcopy to Corinne  

## 2016-05-05 ENCOUNTER — Encounter: Payer: Self-pay | Admitting: Internal Medicine

## 2016-05-05 NOTE — Telephone Encounter (Signed)
Desiree Higgins to see above, pt needs refill

## 2016-05-06 MED ORDER — MONTELUKAST SODIUM 10 MG PO TABS: 10.0000 mg | ORAL_TABLET | Freq: Every day | ORAL | 1 refills | Status: AC

## 2016-05-06 NOTE — Telephone Encounter (Signed)
Rx refill sent to [facility]

## 2016-06-13 ENCOUNTER — Other Ambulatory Visit: Payer: Self-pay | Admitting: Internal Medicine

## 2016-06-23 DIAGNOSIS — M25571 Pain in right ankle and joints of right foot: Secondary | ICD-10-CM | POA: Diagnosis not present

## 2016-06-23 DIAGNOSIS — M25572 Pain in left ankle and joints of left foot: Secondary | ICD-10-CM | POA: Diagnosis not present

## 2016-07-13 ENCOUNTER — Encounter: Payer: Self-pay | Admitting: Internal Medicine

## 2016-07-13 MED ORDER — AZITHROMYCIN 250 MG PO TABS: ORAL_TABLET | ORAL | 1 refills | Status: DC

## 2016-07-20 ENCOUNTER — Encounter: Payer: Self-pay | Admitting: Internal Medicine

## 2016-07-23 DIAGNOSIS — M25572 Pain in left ankle and joints of left foot: Secondary | ICD-10-CM | POA: Diagnosis not present

## 2016-08-06 ENCOUNTER — Telehealth: Payer: Self-pay | Admitting: Internal Medicine

## 2016-08-06 NOTE — Telephone Encounter (Signed)
Called patient to schedule awv. Left message for patient to call office.

## 2016-08-12 ENCOUNTER — Ambulatory Visit (INDEPENDENT_AMBULATORY_CARE_PROVIDER_SITE_OTHER)
Admission: RE | Admit: 2016-08-12 | Discharge: 2016-08-12 | Disposition: A | Discharge status: Home / Self Care | Payer: Medicare Other | Source: Ambulatory Visit | Attending: Internal Medicine | Admitting: Internal Medicine

## 2016-08-12 ENCOUNTER — Other Ambulatory Visit (INDEPENDENT_AMBULATORY_CARE_PROVIDER_SITE_OTHER): Payer: Medicare Other

## 2016-08-12 ENCOUNTER — Ambulatory Visit (INDEPENDENT_AMBULATORY_CARE_PROVIDER_SITE_OTHER): Payer: Medicare Other | Admitting: Internal Medicine

## 2016-08-12 ENCOUNTER — Encounter: Payer: Self-pay | Admitting: Internal Medicine

## 2016-08-12 VITALS — BP 140/80 | HR 103 | Temp 98.9°F | Resp 20 | Wt 213.0 lb

## 2016-08-12 DIAGNOSIS — M47814 Spondylosis without myelopathy or radiculopathy, thoracic region: Secondary | ICD-10-CM | POA: Diagnosis not present

## 2016-08-12 DIAGNOSIS — E2839 Other primary ovarian failure: Secondary | ICD-10-CM

## 2016-08-12 DIAGNOSIS — G4710 Hypersomnia, unspecified: Secondary | ICD-10-CM

## 2016-08-12 DIAGNOSIS — J029 Acute pharyngitis, unspecified: Secondary | ICD-10-CM | POA: Diagnosis not present

## 2016-08-12 DIAGNOSIS — R221 Localized swelling, mass and lump, neck: Secondary | ICD-10-CM

## 2016-08-12 DIAGNOSIS — R634 Abnormal weight loss: Secondary | ICD-10-CM

## 2016-08-12 DIAGNOSIS — Z1211 Encounter for screening for malignant neoplasm of colon: Secondary | ICD-10-CM | POA: Diagnosis not present

## 2016-08-12 DIAGNOSIS — M549 Dorsalgia, unspecified: Secondary | ICD-10-CM | POA: Insufficient documentation

## 2016-08-12 DIAGNOSIS — Z23 Encounter for immunization: Secondary | ICD-10-CM

## 2016-08-12 DIAGNOSIS — M5489 Other dorsalgia: Secondary | ICD-10-CM

## 2016-08-12 DIAGNOSIS — I10 Essential (primary) hypertension: Secondary | ICD-10-CM | POA: Diagnosis not present

## 2016-08-12 DIAGNOSIS — R5383 Other fatigue: Secondary | ICD-10-CM | POA: Diagnosis not present

## 2016-08-12 DIAGNOSIS — M47816 Spondylosis without myelopathy or radiculopathy, lumbar region: Secondary | ICD-10-CM | POA: Diagnosis not present

## 2016-08-12 LAB — CBC WITH DIFFERENTIAL/PLATELET
BASOS ABS: 0 10*3/uL (ref 0.0–0.1)
BASOS PCT: 0.3 % (ref 0.0–3.0)
EOS ABS: 1.7 10*3/uL — AB (ref 0.0–0.7)
Eosinophils Relative: 11.2 % — ABNORMAL HIGH (ref 0.0–5.0)
HCT: 33.9 % — ABNORMAL LOW (ref 36.0–46.0)
HEMOGLOBIN: 11.3 g/dL — AB (ref 12.0–15.0)
Lymphocytes Relative: 19.7 % (ref 12.0–46.0)
Lymphs Abs: 2.9 10*3/uL (ref 0.7–4.0)
MCHC: 33.4 g/dL (ref 30.0–36.0)
MCV: 87.8 fl (ref 78.0–100.0)
MONO ABS: 0.6 10*3/uL (ref 0.1–1.0)
Monocytes Relative: 4.2 % (ref 3.0–12.0)
NEUTROS ABS: 9.6 10*3/uL — AB (ref 1.4–7.7)
Neutrophils Relative %: 64.6 % (ref 43.0–77.0)
PLATELETS: 397 10*3/uL (ref 150.0–400.0)
RBC: 3.86 Mil/uL — ABNORMAL LOW (ref 3.87–5.11)
RDW: 16.3 % — ABNORMAL HIGH (ref 11.5–15.5)
WBC: 14.9 10*3/uL — AB (ref 4.0–10.5)

## 2016-08-12 MED ORDER — AZITHROMYCIN 250 MG PO TABS: ORAL_TABLET | ORAL | 1 refills | Status: DC

## 2016-08-12 NOTE — Progress Notes (Signed)
Pre visit review using our clinic review tool, if applicable. No additional management support is needed unless otherwise documented below in the visit note. 

## 2016-08-12 NOTE — Assessment & Plan Note (Addendum)
suspicous for malignancy, for ENT referral  Note:  Total time for pt hx, exam, review of record with pt in the room, determination of diagnoses and plan for further eval and tx is > 40 min, with over 50% spent in coordination and counseling of patient

## 2016-08-12 NOTE — Assessment & Plan Note (Signed)
?   Significance, for ENT eval of neck mass first, but consider pulm referral if persists

## 2016-08-12 NOTE — Assessment & Plan Note (Signed)
S/p falls x 3 last yr, for ls spine and thoracic films

## 2016-08-12 NOTE — Assessment & Plan Note (Signed)
Etiology unclear, also for cxr, labs today

## 2016-08-12 NOTE — Progress Notes (Signed)
Subjective:    Patient ID: Desiree Higgins, female    DOB: [birth year], 71 y.o.   MRN: [id number]  HPI  Here for yearly f/u with several concerns,  Overall not doing well per pt;  Pt denies Chest pain, worsening SOB, DOE, wheezing, orthopnea, PND, worsening LE edema, palpitations, dizziness or syncope.  Pt denies neurological change such as new headache, facial or extremity weakness.  Pt denies polydipsia, polyuria, or low sugar symptoms. Pt states overall good compliance with treatment and medications, good tolerability, and has been trying to follow appropriate diet.  Pt denies worsening depressive symptoms, suicidal ideation or panic.  Pt states good ability with ADL's, has low to mod fall risk, home safety reviewed and adequate, no other significant changes in hearing or vision, and not active with exercise. Pt does c/o ST without fever, ? Strep but no swelling, redness she is aware, and no cough.  Does also have new 2-3 wks noted mass quite firm to the right angle of jaw, very concerned about this, nervous though she tries to minimize this.   Has had reduced calorie intake, "ok" appetite, but signfiicant Wt loss Wt Readings from Last 3 Encounters:  08/12/16 213 lb (96.6 kg)  02/25/16 235 lb (106.6 kg)  12/03/15 234 lb (106.1 kg)  Pt also continues to have recurring LBP/thoracic pain worsening after 3 falls last yr,but no bowel or bladder change, fever,  worsening LE pain/numbness/weakness.  Also Does c/o ongoing fatigue, but has signficant daytime hypersomnolence for several months but no snoring.  Also has had some toe pain worse to walk, has seen Dr Riley Nearing with dx of bunions.  No other new history Past Medical History:  Diagnosis Date  . ALLERGIC RHINITIS 09/22/2007   Qualifier: Diagnosis of  By: Jenny Reichmann MD, Hunt Oris   . ANXIETY 09/22/2007   Qualifier: Diagnosis of  By: Jenny Reichmann MD, Hunt Oris   . DEPRESSION 09/22/2007   Qualifier: Diagnosis of  By: Jenny Reichmann MD, Hunt Oris   . HYPERLIPIDEMIA 09/22/2007   Qualifier: Diagnosis of  By: Jenny Reichmann MD, Hunt Oris   . HYPERTENSION 09/22/2007   Qualifier: Diagnosis of  By: Jenny Reichmann MD, Hunt Oris   . Impaired glucose tolerance 12/19/2012  . OSTEOARTHRITIS, KNEE 11/21/2008   Qualifier: Diagnosis of  By: Jenny Reichmann MD, [place] BLADDER 09/22/2007   Qualifier: Diagnosis of  By: Jenny Reichmann MD, Hunt Oris   . Smoker 12/26/2013  . Unspecified asthma(493.90) 12/26/2013  . VITAMIN D DEFICIENCY 06/04/2010   Qualifier: Diagnosis of  By: Jenny Reichmann MD, Hunt Oris    Past Surgical History:  Procedure Laterality Date  . ABDOMINAL HYSTERECTOMY    . BREAST BIOPSY  1972  . CATARACT EXTRACTION     right  . CHOLECYSTECTOMY    . OOPHORECTOMY    . TONSILLECTOMY      reports that she has been smoking.  She has never used smokeless tobacco. She reports that she does not drink alcohol or use drugs. family history includes Cancer in her mother. Allergies  Allergen Reactions  . Ace Inhibitors     REACTION: cough   Current Outpatient Prescriptions on File Prior to Visit  Medication Sig Dispense Refill  . aspirin 81 MG EC tablet Take 81 mg by mouth daily. Swallow whole.    . clonazePAM (KLONOPIN) 0.5 MG tablet TAKE ONE TABLET BY MOUTH TWICE DAILY AS NEEDED 60 tablet 2  . DULoxetine (CYMBALTA) 60 MG capsule Take 1 capsule (60 mg total) by mouth daily.  90 capsule 3  . losartan-hydrochlorothiazide (HYZAAR) 100-25 MG tablet Take 1 tablet by mouth daily. 90 tablet 3  . montelukast (SINGULAIR) 10 MG tablet Take 1 tablet (10 mg total) by mouth daily with breakfast. 90 tablet 1  . oxyCODONE-acetaminophen (PERCOCET) 10-325 MG per tablet Take 1 tablet by mouth every 4 (four) hours as needed.    Marland Kitchen PROAIR HFA 108 (90 Base) MCG/ACT inhaler INHALE TWO PUFFS INTO LUNGS EVERY 6 HOURS AS NEEDED FOR WHEEZING AND FOR SHORTNESS OF BREATH 9 each 1  . simvastatin (ZOCOR) 40 MG tablet Take 1 tablet (40 mg total) by mouth daily. 90 tablet 3  . triamcinolone (NASACORT) 55 MCG/ACT nasal inhaler Place 2 sprays into  the nose daily. 1 Inhaler 12  . azithromycin (ZITHROMAX Z-PAK) 250 MG tablet 2 by mouth on day 1, then 1 per day (Patient not taking: Reported on 08/12/2016) 6 tablet 1  . cholestyramine light (PREVALITE) 4 g packet 1 packet by mouth twice per day (Patient not taking: Reported on 08/12/2016) 180 packet 3  . cholestyramine light (PREVALITE) 4 g packet Take 1 packet (4 g total) by mouth 2 (two) times daily. (Patient not taking: Reported on 08/12/2016) 180 packet 3  . predniSONE (DELTASONE) 10 MG tablet 3 tabs by mouth per day for 3 days,2tabs per day for 3 days,1tab per day for 3 days (Patient not taking: Reported on 08/12/2016) 18 tablet 0   No current facility-administered medications on file prior to visit.     Review of Systems  Constitutional: Negative for unusual diaphoresis or night sweats HENT: Negative for ear swelling or discharge Eyes: Negative for worsening visual haziness  Respiratory: Negative for choking and stridor.   Gastrointestinal: Negative for distension or worsening eructation Genitourinary: Negative for retention or change in urine volume.  Musculoskeletal: Negative for other MSK pain or swelling Skin: Negative for color change and worsening wound Neurological: Negative for tremors and numbness other than noted  Psychiatric/Behavioral: Negative for decreased concentration or agitation other than above   All other system neg per pt    Objective:   Physical Exam BP 140/80   Pulse (!) 103   Temp 98.9 F (37.2 C) (Oral)   Resp 20   Wt 213 lb (96.6 kg)   SpO2 97%   BMI 31.45 kg/m  VS noted, nontoxic Constitutional: Pt appears in no apparent distress HENT: Head: NCAT.  Right Ear: External ear normal.  Left Ear: External ear normal.  Bilat tm's with mild erythema.  Max sinus areas non tender.  Pharynx with mild erythema, no exudate Eyes: . Pupils are equal, round, and reactive to light. Conjunctivae and EOM are normal Neck: Normal range of motion. Neck supple. but with >  1 cm firm fixed mass at the right angle of jaw, no other masses noted, tenderness or swelling Cardiovascular: Normal rate and regular rhythm.   Pulmonary/Chest: Effort normal and breath sounds without rales or wheezing.  Abd:  Soft, NT, ND, + BS Spine: mild diffuse tender lumbar/thoracic Neurological: Pt is alert. Not confused , motor 5/5  intact Skin: Skin is warm. No rash, no LE edema Psychiatric: Pt behavior is normal. No agitation. 1+ nervous No other new exam findings    Assessment & Plan:

## 2016-08-12 NOTE — Patient Instructions (Addendum)
You had the flu shot today  Please take all new medication as prescribed - the antibiotic  Please continue all other medications as before, and refills have been done if requested.  Please have the pharmacy call with any other refills you may need.  Please schedule the bone density test before leaving today at the scheduling desk (where you check out)  You will be contacted regarding the referral for: colonoscopy, as well as ENT  You are otherwise up to date with prevention measures today.  Please keep your appointments with your specialists as you may have planned  Please go to the XRAY Department in the Basement (go straight as you get off the elevator) for the x-ray testing  Please go to the LAB in the Basement (turn left off the elevator) for the tests to be done today  You will be contacted by phone if any changes need to be made immediately.  Otherwise, you will receive a letter about your results with an explanation, but please check with MyChart first.  Please remember to sign up for MyChart if you have not done so, as this will be important to you in the future with finding out test results, communicating by private email, and scheduling acute appointments online when needed.  If you have Medicare related insurance (such as traditoinal Medicare, [employer] or [employer], or similar), Please make an appointment at the Scheduling desk with Maudie Mercury, the [employer], for your Wellness Visit in this office, which is a benefit with your insurance.  Please return in 1 months, or sooner if needed

## 2016-08-12 NOTE — Assessment & Plan Note (Signed)
?   infecitous vs other, for empiric zpack,  to f/u any worsening symptoms or concerns

## 2016-08-13 LAB — URINALYSIS, ROUTINE W REFLEX MICROSCOPIC
Bilirubin Urine: NEGATIVE
Hgb urine dipstick: NEGATIVE
Ketones, ur: NEGATIVE
Leukocytes, UA: NEGATIVE
Nitrite: NEGATIVE
PH: 6 (ref 5.0–8.0)
Specific Gravity, Urine: 1.01 (ref 1.000–1.030)
TOTAL PROTEIN, URINE-UPE24: NEGATIVE
URINE GLUCOSE: NEGATIVE
UROBILINOGEN UA: 0.2 (ref 0.0–1.0)

## 2016-08-13 LAB — HEPATIC FUNCTION PANEL
ALT: 17 U/L (ref 0–35)
AST: 20 U/L (ref 0–37)
Albumin: 3.3 g/dL — ABNORMAL LOW (ref 3.5–5.2)
Alkaline Phosphatase: 115 U/L (ref 39–117)
BILIRUBIN TOTAL: 0.3 mg/dL (ref 0.2–1.2)
Bilirubin, Direct: 0 mg/dL (ref 0.0–0.3)
TOTAL PROTEIN: 8 g/dL (ref 6.0–8.3)

## 2016-08-13 LAB — BASIC METABOLIC PANEL
BUN: 30 mg/dL — ABNORMAL HIGH (ref 6–23)
CALCIUM: 10.3 mg/dL (ref 8.4–10.5)
CHLORIDE: 98 meq/L (ref 96–112)
CO2: 27 mEq/L (ref 19–32)
CREATININE: 1.85 mg/dL — AB (ref 0.40–1.20)
GFR: 28.46 mL/min — ABNORMAL LOW (ref >60.00)
Glucose, Bld: 109 mg/dL — ABNORMAL HIGH (ref 70–99)
Potassium: 4.1 mEq/L (ref 3.5–5.1)
SODIUM: 134 meq/L — AB (ref 135–145)

## 2016-08-13 LAB — TSH: TSH: 1.84 u[IU]/mL (ref 0.35–4.50)

## 2016-08-17 ENCOUNTER — Other Ambulatory Visit: Payer: Self-pay | Admitting: Internal Medicine

## 2016-08-17 DIAGNOSIS — R778 Other specified abnormalities of plasma proteins: Secondary | ICD-10-CM

## 2016-08-17 LAB — PROTEIN ELECTROPHORESIS, SERUM, WITH REFLEX
ALPHA-1-GLOBULIN: 0.6 g/dL — AB (ref 0.2–0.3)
ALPHA-2-GLOBULIN: 1 g/dL — AB (ref 0.5–0.9)
Abnormal Protein Band1: 0.5 g/dL
Abnormal Protein Band2: 0.5 g/dL
Abnormal Protein Band3: NOT DETECTED g/dL
Albumin ELP: 3.1 g/dL — ABNORMAL LOW (ref 3.8–4.8)
BETA 2: 0.5 g/dL (ref 0.2–0.5)
Beta Globulin: 0.5 g/dL (ref 0.4–0.6)
Gamma Globulin: 2 g/dL — ABNORMAL HIGH (ref 0.8–1.7)
Total Protein, Serum Electrophoresis: 7.5 g/dL (ref 6.1–8.1)

## 2016-08-17 LAB — IFE INTERPRETATION

## 2016-08-18 ENCOUNTER — Other Ambulatory Visit: Payer: Self-pay | Admitting: Otolaryngology

## 2016-08-18 ENCOUNTER — Other Ambulatory Visit (HOSPITAL_COMMUNITY): Payer: Self-pay | Admitting: Otolaryngology

## 2016-08-18 DIAGNOSIS — C099 Malignant neoplasm of tonsil, unspecified: Secondary | ICD-10-CM

## 2016-08-18 DIAGNOSIS — D3702 Neoplasm of uncertain behavior of tongue: Secondary | ICD-10-CM | POA: Diagnosis not present

## 2016-08-18 DIAGNOSIS — C090 Malignant neoplasm of tonsillar fossa: Secondary | ICD-10-CM | POA: Diagnosis not present

## 2016-08-18 DIAGNOSIS — R1313 Dysphagia, pharyngeal phase: Secondary | ICD-10-CM | POA: Diagnosis not present

## 2016-08-20 ENCOUNTER — Ambulatory Visit: Payer: Medicare Other | Admitting: Internal Medicine

## 2016-08-20 ENCOUNTER — Telehealth: Payer: Self-pay | Admitting: Internal Medicine

## 2016-08-20 NOTE — Telephone Encounter (Signed)
Needs to go to ER now.

## 2016-08-20 NOTE — Telephone Encounter (Signed)
Pt's spouse called request to speak to Dr. Jenny Reichmann or his assistant concern about Desiree Higgins condition. He said she seem to be "loopy" not sure if it is from meds/abx that she is taking but he is very concern about this. Please give him a call today if possible.

## 2016-08-22 ENCOUNTER — Emergency Department (HOSPITAL_COMMUNITY): Payer: Medicare Other

## 2016-08-22 ENCOUNTER — Telehealth: Payer: Self-pay | Admitting: Internal Medicine

## 2016-08-22 ENCOUNTER — Inpatient Hospital Stay (HOSPITAL_COMMUNITY)
Admission: EM | Admit: 2016-08-22 | Discharge: 2016-08-25 | DRG: 072 | Disposition: A | Discharge status: Home Health | Payer: Medicare Other | Attending: Internal Medicine | Admitting: Internal Medicine

## 2016-08-22 ENCOUNTER — Encounter (HOSPITAL_COMMUNITY): Payer: Self-pay | Admitting: Emergency Medicine

## 2016-08-22 DIAGNOSIS — Z6835 Body mass index (BMI) 35.0-35.9, adult: Secondary | ICD-10-CM

## 2016-08-22 DIAGNOSIS — E785 Hyperlipidemia, unspecified: Secondary | ICD-10-CM | POA: Diagnosis present

## 2016-08-22 DIAGNOSIS — R1310 Dysphagia, unspecified: Secondary | ICD-10-CM | POA: Diagnosis not present

## 2016-08-22 DIAGNOSIS — D638 Anemia in other chronic diseases classified elsewhere: Secondary | ICD-10-CM | POA: Diagnosis not present

## 2016-08-22 DIAGNOSIS — R4182 Altered mental status, unspecified: Secondary | ICD-10-CM | POA: Diagnosis not present

## 2016-08-22 DIAGNOSIS — Z7952 Long term (current) use of systemic steroids: Secondary | ICD-10-CM

## 2016-08-22 DIAGNOSIS — E860 Dehydration: Secondary | ICD-10-CM | POA: Diagnosis not present

## 2016-08-22 DIAGNOSIS — F17210 Nicotine dependence, cigarettes, uncomplicated: Secondary | ICD-10-CM | POA: Diagnosis present

## 2016-08-22 DIAGNOSIS — I129 Hypertensive chronic kidney disease with stage 1 through stage 4 chronic kidney disease, or unspecified chronic kidney disease: Secondary | ICD-10-CM | POA: Diagnosis present

## 2016-08-22 DIAGNOSIS — R404 Transient alteration of awareness: Secondary | ICD-10-CM | POA: Diagnosis not present

## 2016-08-22 DIAGNOSIS — R410 Disorientation, unspecified: Secondary | ICD-10-CM | POA: Diagnosis not present

## 2016-08-22 DIAGNOSIS — N183 Chronic kidney disease, stage 3 unspecified: Secondary | ICD-10-CM | POA: Diagnosis present

## 2016-08-22 DIAGNOSIS — M1710 Unilateral primary osteoarthritis, unspecified knee: Secondary | ICD-10-CM | POA: Diagnosis present

## 2016-08-22 DIAGNOSIS — M1990 Unspecified osteoarthritis, unspecified site: Secondary | ICD-10-CM | POA: Diagnosis present

## 2016-08-22 DIAGNOSIS — M542 Cervicalgia: Secondary | ICD-10-CM | POA: Diagnosis not present

## 2016-08-22 DIAGNOSIS — C799 Secondary malignant neoplasm of unspecified site: Secondary | ICD-10-CM

## 2016-08-22 DIAGNOSIS — R2689 Other abnormalities of gait and mobility: Secondary | ICD-10-CM | POA: Diagnosis present

## 2016-08-22 DIAGNOSIS — G8929 Other chronic pain: Secondary | ICD-10-CM | POA: Diagnosis present

## 2016-08-22 DIAGNOSIS — Z85818 Personal history of malignant neoplasm of other sites of lip, oral cavity, and pharynx: Secondary | ICD-10-CM

## 2016-08-22 DIAGNOSIS — F329 Major depressive disorder, single episode, unspecified: Secondary | ICD-10-CM | POA: Diagnosis present

## 2016-08-22 DIAGNOSIS — R531 Weakness: Secondary | ICD-10-CM | POA: Diagnosis not present

## 2016-08-22 DIAGNOSIS — F419 Anxiety disorder, unspecified: Secondary | ICD-10-CM | POA: Diagnosis present

## 2016-08-22 DIAGNOSIS — R296 Repeated falls: Secondary | ICD-10-CM | POA: Diagnosis present

## 2016-08-22 DIAGNOSIS — E669 Obesity, unspecified: Secondary | ICD-10-CM | POA: Diagnosis not present

## 2016-08-22 DIAGNOSIS — Z79899 Other long term (current) drug therapy: Secondary | ICD-10-CM

## 2016-08-22 DIAGNOSIS — R221 Localized swelling, mass and lump, neck: Secondary | ICD-10-CM | POA: Diagnosis present

## 2016-08-22 DIAGNOSIS — E876 Hypokalemia: Secondary | ICD-10-CM | POA: Diagnosis present

## 2016-08-22 DIAGNOSIS — J45909 Unspecified asthma, uncomplicated: Secondary | ICD-10-CM | POA: Diagnosis present

## 2016-08-22 DIAGNOSIS — G4710 Hypersomnia, unspecified: Secondary | ICD-10-CM | POA: Diagnosis present

## 2016-08-22 DIAGNOSIS — I10 Essential (primary) hypertension: Secondary | ICD-10-CM | POA: Diagnosis present

## 2016-08-22 DIAGNOSIS — Z888 Allergy status to other drugs, medicaments and biological substances status: Secondary | ICD-10-CM

## 2016-08-22 DIAGNOSIS — G9341 Metabolic encephalopathy: Principal | ICD-10-CM | POA: Diagnosis present

## 2016-08-22 DIAGNOSIS — Z7982 Long term (current) use of aspirin: Secondary | ICD-10-CM

## 2016-08-22 DIAGNOSIS — N3281 Overactive bladder: Secondary | ICD-10-CM | POA: Diagnosis present

## 2016-08-22 DIAGNOSIS — Z79891 Long term (current) use of opiate analgesic: Secondary | ICD-10-CM

## 2016-08-22 LAB — URINALYSIS, ROUTINE W REFLEX MICROSCOPIC
Bilirubin Urine: NEGATIVE
Glucose, UA: NEGATIVE mg/dL
Hgb urine dipstick: NEGATIVE
Ketones, ur: NEGATIVE mg/dL
Leukocytes, UA: NEGATIVE
Nitrite: NEGATIVE
Protein, ur: NEGATIVE mg/dL
Specific Gravity, Urine: 1.016 (ref 1.005–1.030)
pH: 5 (ref 5.0–8.0)

## 2016-08-22 LAB — COMPREHENSIVE METABOLIC PANEL
ALT: 16 U/L (ref 14–54)
AST: 16 U/L (ref 15–41)
Albumin: 2.9 g/dL — ABNORMAL LOW (ref 3.5–5.0)
Alkaline Phosphatase: 84 U/L (ref 38–126)
Anion gap: 8 (ref 5–15)
BUN: 33 mg/dL — ABNORMAL HIGH (ref 6–20)
CO2: 27 mmol/L (ref 22–32)
Calcium: 10.4 mg/dL — ABNORMAL HIGH (ref 8.9–10.3)
Chloride: 100 mmol/L — ABNORMAL LOW (ref 101–111)
Creatinine, Ser: 1.43 mg/dL — ABNORMAL HIGH (ref 0.44–1.00)
GFR calc Af Amer: 41 mL/min — ABNORMAL LOW (ref >60)
GFR calc non Af Amer: 36 mL/min — ABNORMAL LOW (ref >60)
Glucose, Bld: 103 mg/dL — ABNORMAL HIGH (ref 65–99)
Potassium: 3.5 mmol/L (ref 3.5–5.1)
Sodium: 135 mmol/L (ref 135–145)
Total Bilirubin: 0.5 mg/dL (ref 0.3–1.2)
Total Protein: 7.7 g/dL (ref 6.5–8.1)

## 2016-08-22 LAB — MAGNESIUM: Magnesium: 1.8 mg/dL (ref 1.7–2.4)

## 2016-08-22 LAB — TROPONIN I: Troponin I: <0.03 ng/mL (ref <0.03)

## 2016-08-22 LAB — CBC WITH DIFFERENTIAL/PLATELET
Basophils Absolute: 0.1 10*3/uL (ref 0.0–0.1)
Basophils Relative: 1 %
Eosinophils Absolute: 0.9 10*3/uL — ABNORMAL HIGH (ref 0.0–0.7)
Eosinophils Relative: 7 %
HCT: 32.6 % — ABNORMAL LOW (ref 36.0–46.0)
Hemoglobin: 10.5 g/dL — ABNORMAL LOW (ref 12.0–15.0)
Lymphocytes Relative: 19 %
Lymphs Abs: 2.5 10*3/uL (ref 0.7–4.0)
MCH: 28.8 pg (ref 26.0–34.0)
MCHC: 32.2 g/dL (ref 30.0–36.0)
MCV: 89.6 fL (ref 78.0–100.0)
Monocytes Absolute: 0.9 10*3/uL (ref 0.1–1.0)
Monocytes Relative: 7 %
Neutro Abs: 8.8 10*3/uL — ABNORMAL HIGH (ref 1.7–7.7)
Neutrophils Relative %: 66 %
Platelets: 344 10*3/uL (ref 150–400)
RBC: 3.64 MIL/uL — ABNORMAL LOW (ref 3.87–5.11)
RDW: 16.5 % — ABNORMAL HIGH (ref 11.5–15.5)
WBC: 13.1 10*3/uL — ABNORMAL HIGH (ref 4.0–10.5)

## 2016-08-22 LAB — ACETAMINOPHEN LEVEL: Acetaminophen (Tylenol), Serum: <10 ug/mL — ABNORMAL LOW (ref 10–30)

## 2016-08-22 LAB — PHOSPHORUS: PHOSPHORUS: 2.6 mg/dL (ref 2.5–4.6)

## 2016-08-22 LAB — I-STAT CG4 LACTIC ACID, ED: Lactic Acid, Venous: 0.78 mmol/L (ref 0.5–1.9)

## 2016-08-22 LAB — TSH: TSH: 0.258 u[IU]/mL — ABNORMAL LOW (ref 0.350–4.500)

## 2016-08-22 MED ORDER — ONDANSETRON HCL 4 MG/2ML IJ SOLN: 4.0000 mg | Freq: Four times a day (QID) | INTRAMUSCULAR | Status: DC | PRN

## 2016-08-22 MED ORDER — MONTELUKAST SODIUM 10 MG PO TABS
10.0000 mg | ORAL_TABLET | Freq: Every day | ORAL | Status: DC
  Administered 2016-08-23 – 2016-08-25 (×3): 10 mg via ORAL
  Filled 2016-08-22 (×3): qty 1

## 2016-08-22 MED ORDER — IOPAMIDOL (ISOVUE-300) INJECTION 61%
INTRAVENOUS | Status: AC
  Administered 2016-08-22: 60 mL via INTRAVENOUS
  Filled 2016-08-22: qty 75

## 2016-08-22 MED ORDER — ACETAMINOPHEN 325 MG PO TABS
650.0000 mg | ORAL_TABLET | Freq: Four times a day (QID) | ORAL | Status: DC | PRN
  Administered 2016-08-24: 650 mg via ORAL
  Filled 2016-08-22: qty 2

## 2016-08-22 MED ORDER — NALOXONE HCL 0.4 MG/ML IJ SOLN: 0.2000 mg | INTRAMUSCULAR | Status: DC | PRN

## 2016-08-22 MED ORDER — HEPARIN SODIUM (PORCINE) 5000 UNIT/ML IJ SOLN
5000.0000 [IU] | Freq: Three times a day (TID) | INTRAMUSCULAR | Status: DC
  Administered 2016-08-22 – 2016-08-25 (×8): 5000 [IU] via SUBCUTANEOUS
  Filled 2016-08-22 (×9): qty 1

## 2016-08-22 MED ORDER — ONDANSETRON HCL 4 MG PO TABS: 4.0000 mg | ORAL_TABLET | Freq: Four times a day (QID) | ORAL | Status: DC | PRN

## 2016-08-22 MED ORDER — SODIUM CHLORIDE 0.9 % IV SOLN
INTRAVENOUS | Status: AC
  Administered 2016-08-22: 22:00:00 via INTRAVENOUS

## 2016-08-22 MED ORDER — DULOXETINE HCL 60 MG PO CPEP
60.0000 mg | ORAL_CAPSULE | Freq: Every day | ORAL | Status: DC
  Administered 2016-08-23 – 2016-08-25 (×3): 60 mg via ORAL
  Filled 2016-08-22 (×3): qty 1

## 2016-08-22 MED ORDER — GADOBENATE DIMEGLUMINE 529 MG/ML IV SOLN
10.0000 mL | Freq: Once | INTRAVENOUS | Status: AC | PRN
  Administered 2016-08-22: 10 mL via INTRAVENOUS

## 2016-08-22 MED ORDER — ASPIRIN EC 81 MG PO TBEC
81.0000 mg | DELAYED_RELEASE_TABLET | Freq: Every day | ORAL | Status: DC
  Administered 2016-08-23 – 2016-08-25 (×3): 81 mg via ORAL
  Filled 2016-08-22 (×3): qty 1

## 2016-08-22 MED ORDER — ACETAMINOPHEN 650 MG RE SUPP: 650.0000 mg | Freq: Four times a day (QID) | RECTAL | Status: DC | PRN

## 2016-08-22 NOTE — ED Notes (Signed)
Patient and family member made aware of pending urine sample. Patient states that she does not have to go at this time. I notified them to call out when ready.

## 2016-08-22 NOTE — ED Triage Notes (Addendum)
Per EMS- called by husband for pt c/o weakness x 3 days. Pt remains alert, oriented and appropriate. Pt is ambulatory with assist. Denies NVD, denies dizziness. Husband with pt. Per husband-Pt takes oxycodone for arthritis

## 2016-08-22 NOTE — H&P (Addendum)
History and Physical    Meghna Hagmann ZGY:[id number] DOB: 20-Sep-1945 DOA: 08/22/2016  Referring Provider: Dr. Jeneen Rinks PCP: [name], MD   Patient coming from: home  Chief Complaint: AMS, hypersomnolence and ataxia.  HPI: Rickelle Sylvestre is a 71 y.o. female with PMH significant for significant for obesity, HTN, HLD, depression, anxiety, chronic pain from OA and asthma; who presented to ED secondary to increase somnolence, AMS and ataxia. Patient symptoms started approx 3 weeks ago and steadily progressing. At this point patient unable to assist with her care and is sleeping almost the whole day. As part of her AMS, patient is confused, forgetful and unable to remember simple tasks (like if she took or not her meds).  Patient denies CP, SOB, focal weakness, fever, chills, dysuria, abd pain or any other complaints. Of note, patient had new induration in the right side of her neck, unclear etiology, but according to patient is slightly sore and some times give her trouble swallowing.  ED Course: patient with neg CT head and MRI head; had CT soft tissue of the neck with findings concerning for metastatic disease and showing tonsillar fullness. Rest of her work up essentially WNL. But given new ataxia, AMS and hypersomnolence, TRH called to place in observation for further evaluation and treatment.  Review of Systems:  All other systems reviewed and apart from HPI, are negative.  Past Medical History:  Diagnosis Date  . ALLERGIC RHINITIS 09/22/2007   Qualifier: Diagnosis of  By: Jenny Reichmann MD, Hunt Oris   . ANXIETY 09/22/2007   Qualifier: Diagnosis of  By: Jenny Reichmann MD, Hunt Oris   . DEPRESSION 09/22/2007   Qualifier: Diagnosis of  By: Jenny Reichmann MD, Hunt Oris   . HYPERLIPIDEMIA 09/22/2007   Qualifier: Diagnosis of  By: Jenny Reichmann MD, Hunt Oris   . HYPERTENSION 09/22/2007   Qualifier: Diagnosis of  By: Jenny Reichmann MD, Hunt Oris   . Impaired glucose tolerance 12/19/2012  . OSTEOARTHRITIS, KNEE 11/21/2008   Qualifier: Diagnosis of  By: Jenny Reichmann  MD, [place] BLADDER 09/22/2007   Qualifier: Diagnosis of  By: Jenny Reichmann MD, Hunt Oris   . Smoker 12/26/2013  . Unspecified asthma(493.90) 12/26/2013  . VITAMIN D DEFICIENCY 06/04/2010   Qualifier: Diagnosis of  By: Jenny Reichmann MD, Hunt Oris     Past Surgical History:  Procedure Laterality Date  . ABDOMINAL HYSTERECTOMY    . BREAST BIOPSY  1972  . CATARACT EXTRACTION     right  . CHOLECYSTECTOMY    . OOPHORECTOMY    . TONSILLECTOMY       reports that she has been smoking.  She has never used smokeless tobacco. She reports that she does not drink alcohol or use drugs.  Allergies  Allergen Reactions  . Ace Inhibitors     REACTION: cough    Family History  Problem Relation Age of Onset  . Cancer Mother     breast     Prior to Admission medications   Medication Sig Start Date End Date Taking? Authorizing Provider  amoxicillin (AMOXIL) 875 MG tablet Take 875 mg by mouth daily. 08/18/16 08/24/16 Yes Historical Provider, MD  aspirin 81 MG EC tablet Take 81 mg by mouth daily. Swallow whole.   Yes Historical Provider, MD  clonazePAM (KLONOPIN) 0.5 MG tablet TAKE ONE TABLET BY MOUTH TWICE DAILY AS NEEDED 04/13/16  Yes Biagio Borg, MD  DULoxetine (CYMBALTA) 60 MG capsule Take 1 capsule (60 mg total) by mouth daily. 03/16/16  Yes Biagio Borg,  MD  losartan-hydrochlorothiazide (HYZAAR) 100-25 MG tablet Take 1 tablet by mouth daily. 03/16/16  Yes Biagio Borg, MD  montelukast (SINGULAIR) 10 MG tablet Take 1 tablet (10 mg total) by mouth daily with breakfast. 05/06/16  Yes Biagio Borg, MD  oxyCODONE-acetaminophen (PERCOCET) 10-325 MG per tablet Take 1 tablet by mouth every 4 (four) hours as needed.   Yes Historical Provider, MD  PROAIR HFA 108 [phone] Base) MCG/ACT inhaler INHALE TWO PUFFS INTO LUNGS EVERY 6 HOURS AS NEEDED FOR WHEEZING AND FOR SHORTNESS OF BREATH 06/14/16  Yes Biagio Borg, MD  simvastatin (ZOCOR) 40 MG tablet Take 1 tablet (40 mg total) by mouth daily. 03/16/16  Yes Biagio Borg, MD    cholestyramine light (PREVALITE) 4 g packet 1 packet by mouth twice per day Patient not taking: Reported on 08/22/2016 03/12/16   Biagio Borg, MD  cholestyramine light (PREVALITE) 4 g packet Take 1 packet (4 g total) by mouth 2 (two) times daily. Patient not taking: Reported on 08/22/2016 03/16/16   Biagio Borg, MD  predniSONE (DELTASONE) 10 MG tablet 3 tabs by mouth per day for 3 days,2tabs per day for 3 days,1tab per day for 3 days Patient not taking: Reported on 08/22/2016 03/16/16   Biagio Borg, MD  triamcinolone (NASACORT) 55 MCG/ACT nasal inhaler Place 2 sprays into the nose daily. Patient not taking: Reported on 08/22/2016 12/19/12   Biagio Borg, MD    Physical Exam: Vitals:   08/22/16 1101 08/22/16 1109 08/22/16 1415 08/22/16 1704  BP: 127/69  128/65 144/72  Pulse: 99  93 93  Resp: _0 Temp: 98.7 F (37.1 C)  98.7 F (37.1 C)   TempSrc: Oral  Oral   SpO2: 100% 100% 92% 92%  Weight: 107.5 kg (237 lb)     Height: _1  (1.753 m)         Constitutional: confused with situation, oriented to person and place only. Patient was falling sleep during interview and significantly deconditioned. Eyes: PERTLA, lids and conjunctivae normal, no nystagmus, no icterus  ENMT: Dry mucous membranes on exam, with thick saliva and dry tongue. Posterior pharynx clear of any exudate or lesions. Denture in place. Neck: supple, with right indurated mass under he mandible, slightly tender with deep palpation.  Respiratory: clear to auscultation bilaterally, no wheezing, no crackles. Normal respiratory effort. No accessory muscle use.  Cardiovascular: S1 & S2 heard, regular rate and rhythm, no murmurs / rubs / gallops. Positive +1 LE edema bilaterally. 2+ pedal pulses. No carotid bruits.  Abdomen: No distension, no tenderness, no masses palpated. No hepatosplenomegaly. Bowel sounds normal.  Musculoskeletal: no clubbing / cyanosis. No joint deformity upper and lower extremities. Good ROM, no  contractures. Normal muscle tone.  Neurologic: CN 2-12 grossly intact. Sensation intact, DTR normal. Strength 4/5 in all 4 limbs due to poor effort.Marland Kitchen  Psychiatric: poor judgment and insight appreciated.. Alert and oriented x 2. Normal mood. Excessively somnolent throughout interview.    Labs on Admission: I have personally reviewed following labs and imaging studies  CBC:  Recent Labs Lab 08/22/16 1324  WBC 13.1*  NEUTROABS 8.8*  HGB 10.5*  HCT 32.6*  MCV 89.6  PLT 696   Basic Metabolic Panel:  Recent Labs Lab 08/22/16 1324  NA 135  K 3.5  CL 100*  CO2 27  GLUCOSE 103*  BUN 33*  CREATININE 1.43*  CALCIUM 10.4*   GFR: Estimated Creatinine Clearance: 46.4 mL/min (by C-G formula  based on SCr of 1.43 mg/dL (H)).   Liver Function Tests:  Recent Labs Lab 08/22/16 1324  AST 16  ALT 16  ALKPHOS 84  BILITOT 0.5  PROT 7.7  ALBUMIN 2.9*   Cardiac Enzymes:  Recent Labs Lab 08/22/16 1324  TROPONINI <0.03   Urine analysis:    Component Value Date/Time   COLORURINE YELLOW 08/22/2016 [place] 08/22/2016 1246   LABSPEC 1.016 08/22/2016 1246   PHURINE 5.0 08/22/2016 1246   GLUCOSEU NEGATIVE 08/22/2016 1246   GLUCOSEU NEGATIVE 08/12/2016 1720   HGBUR NEGATIVE 08/22/2016 1246   BILIRUBINUR NEGATIVE 08/22/2016 1246   KETONESUR NEGATIVE 08/22/2016 1246   PROTEINUR NEGATIVE 08/22/2016 1246   UROBILINOGEN 0.2 08/12/2016 1720   NITRITE NEGATIVE 08/22/2016 1246   LEUKOCYTESUR NEGATIVE 08/22/2016 1246    Radiological Exams on Admission: Dg Chest 2 View  Result Date: 08/22/2016 CLINICAL DATA:  AMS and weakness of Bilateral legs per husband x about 2 weeks.Husband stated to me he believes pt has been taking too many of her prescriptions than what is prescribed.Asthma, HTN, some day smokerSx: breast biopsy -1972 EXAM: CHEST  2 VIEW COMPARISON:  08/12/2016 FINDINGS: Cardiac silhouette is mildly enlarged. No mediastinal or hilar masses. No convincing  adenopathy. There are prominent bronchovascular markings bilaterally, stable. No evidence of pneumonia or pulmonary edema. No pleural effusion or pneumothorax. Skeletal structures are intact. IMPRESSION: No active cardiopulmonary disease. Electronically Signed   By: Lajean Manes M.D.   On: 08/22/2016 13:39   Ct Head Wo Contrast  Result Date: 08/22/2016 CLINICAL DATA:  Three day history of weakness. EXAM: CT HEAD WITHOUT CONTRAST TECHNIQUE: Contiguous axial images were obtained from the base of the skull through the vertex without intravenous contrast. COMPARISON:  None. FINDINGS: Brain: Study is mildly degraded by patient motion. No acute infarct, hemorrhage, or mass lesion is present. Mild generalized atrophy and white matter disease is present. The ventricles are proportionate to the degree of atrophy. No significant extra-axial fluid collection is present. Vascular: Atherosclerotic calcifications are present in the cavernous internal carotid arteries and at the dural margin of the vertebral arteries. No hyperdense vessel is present. Skull: The calvarium is intact. No significant extracranial soft tissue lesions are present. Sinuses/Orbits: A polyp or mucous retention cyst is present in the right maxillary sinus with associated mucosal thickening. The paranasal sinuses and mastoid air cells are otherwise clear. IMPRESSION: 1. Mild generalized atrophy and white matter disease likely reflects the sequela of chronic microvascular ischemia. 2. No acute intracranial abnormality. 3. Atherosclerosis. 4. Right maxillary sinus disease. Electronically Signed   By: San Morelle M.D.   On: 08/22/2016 15:33   Ct Soft Tissue Neck W Contrast  Result Date: 08/22/2016 CLINICAL DATA:  Neck pain.  History of tonsil cancer. EXAM: CT NECK WITH CONTRAST TECHNIQUE: Multidetector CT imaging of the neck was performed using the standard protocol following the bolus administration of intravenous contrast. CONTRAST:  3m  ISOVUE-300 IOPAMIDOL (ISOVUE-300) INJECTION 61% COMPARISON:  None. FINDINGS: Pharynx and larynx: No focal mucosal or submucosal lesions are present. The nasopharynx, oropharynx, and hypopharynx are clear. There is some fullness at the level of the tonsils, left greater than right. Vocal cords are midline and symmetric. Salivary glands: Enhancing nodules are present at the inferior aspect of the parotid glands bilaterally. The upper parotid glands are within normal limits. The submandibular glands are normal bilaterally. Thyroid: Negative Lymph nodes: A right lateral peripherally enhancing nodal mass that measures 2.2 x 1.8 x 2.5 cm. Just  inferior and anterior is a 1.5 cm peripherally enhancing lesion. The areas of the enhancement extending to the right parotid gland. There are several smaller rounded right level 2 lymph nodes. The largest measures 13 mm. A peripherally enhancing right level 3 lymph node posteriorly measures 10 mm. A rounded right level 3 lymph node measures 10 mm. Smaller posterior right level 3 and level 4 lymph nodes are present. Multiple suspicious left-sided lymph nodes are present as well. A peripherally enhancing node or 2 nodes are present along the medial aspect of the inferior left parotid. A rounded left level 2 lymph node measures 12 mm. An ovoid posterior left level 3 lymph node measures 15 mm. A left level 4 lymph node measures 12 mm. Right peripherally enhancing left supraclavicular node measures 2.01.7 cm on image 75 of series 6. There are 2 posterior triangle lymph nodes on the left measuring 15 and 19 mm respectively. Vascular: Atherosclerotic calcifications are present at the carotid bifurcations bilaterally. There is potential stenosis bilaterally. Limited intracranial: Limited imaging the brain is unremarkable. Atherosclerotic calcifications are present. Visualized orbits: A right lens replacement is present. The globes and orbits are otherwise within normal limits. Mastoids and  visualized paranasal sinuses: A large polyp or mucous retention cyst is noted inferiorly in the right maxillary sinus. There is some mucosal thickening associated. The paranasal sinuses and mastoid air cells are otherwise clear. Skeleton: Degenerative endplate changes are most severe at C5-6 and C6-7. Uncovertebral spurring results in right greater than left foraminal narrowing. Left greater than right foraminal narrowing is present at C4-5 due to facet spurring. Upper chest: Dependent atelectasis is present at the lung apices bilaterally. Atherosclerotic calcifications are present at the arch. The upper mediastinum is otherwise within normal limits. IMPRESSION: 1. Extensive bilateral cervical adenopathy concerning metastatic disease. Lymphoproliferative disease is considered less likely. 2. Mild fullness in the tonsillar area bilaterally may reflect prior treatment versus recurrent disease. 3. Bilateral atherosclerosis. 4. Right maxillary sinus disease. Electronically Signed   By: San Morelle M.D.   On: 08/22/2016 15:28   Mr Brain W And Wo Contrast  Result Date: 08/22/2016 CLINICAL DATA:  Altered mental status, recent falls. General weakness. EXAM: MRI HEAD WITHOUT AND WITH CONTRAST TECHNIQUE: Multiplanar, multiecho pulse sequences of the brain and surrounding structures were obtained without and with intravenous contrast. CONTRAST:  97m MULTIHANCE GADOBENATE DIMEGLUMINE 529 MG/ML IV SOLN COMPARISON:  CT neck earlier today. FINDINGS: Significant motion degradation. Study is of marginal diagnostic utility. Some type of susceptibility artifact at the skull bases, leads to artifactual distortion of much of the scan. Brain: No evidence for acute infarction, hemorrhage, mass lesion, hydrocephalus, or extra-axial fluid. Global atrophy. Chronic microvascular ischemic change. Post infusion, no abnormal enhancement of the brain or meninges. Vascular: Normal flow voids. Skull and upper cervical spine: Normal  marrow signal. Sinuses/Orbits: Near complete opacification RIGHT maxillary sinus due to a polyp or retention cyst. No layering fluid. Other: Metastatic adenopathy better visualized on recent CT. Chronic RIGHT mastoid fluid. IMPRESSION: Scan is of marginal diagnostic utility, from the standpoint of patient motion as well as some type of susceptibility artifact which obscures much of the lower slices. No visible restricted diffusion. No definite abnormal postcontrast enhancement. Atrophy and small vessel disease. Electronically Signed   By: JStaci RighterM.D.   On: 08/22/2016 18:27    EKG:  No acute ischemic changes  Assessment/Plan 1-Altered mental state: unclear etiology. Suspecting related to misuse of her medications (especially oxycodone and klonopin).  -will hold  these two drugs, and monitor  -will provide IVF's resuscitation and will check TSH, B12  -CT and MRI ruling out acute process  -will check cortisol and ammonia evel  2-neck indurated mass -will benefit of close follow up with ENT and or IR for biopsies   3-Hyperlipidemia -Will hold zocor for now -check lipid panel; also will check CK level, CRP and ESR  4-Essential hypertension -will hold HCTZ and lisinopril, while rehydrating patient  5-Mass of right side of neck -Unclear etiology and if is prymary or secondary -needs ENT or IR for daignostic biopsie.   6-Hypersomnolence -with concerns for OSA -will give a trial with CPAP  7-CKD (chronic kidney disease) stage 3, GFR 30-59 ml/min -appears to be at baseline -will provide gentle hydration and follow trend.     8-depression -will continue cymbalta  9-obesity -Body mass index is 35 kg/m. -low calorie diet and exercise discussed with patient  Time: 50 minutes   DVT prophylaxis: heparin   Code Status: Full  Family Communication: husband  Disposition Plan: to be determined, PT/OT consulted. Consults called: none  Admission status: observation, med-surg, LOS <  2 midnights    Barton Dubois MD [facility] Pager [phone]  If 7PM-7AM, please contact night-coverage [email or web address] Password [facility]  08/22/2016, 6:56 PM

## 2016-08-22 NOTE — ED Notes (Signed)
All metal jewelry taken off patient and given to spouse. Patients belongings are also bagging in room and patient belonging bag. Spouse stepped away for fresh air and to grab a bite to eat.

## 2016-08-22 NOTE — ED Notes (Signed)
Patient transported to MRI 

## 2016-08-22 NOTE — ED Notes (Signed)
Patient gone to CT, will draw labs when they return

## 2016-08-23 ENCOUNTER — Ambulatory Visit (HOSPITAL_COMMUNITY): Admission: RE | Admit: 2016-08-23 | Payer: Medicare Other | Source: Ambulatory Visit

## 2016-08-23 ENCOUNTER — Encounter (HOSPITAL_COMMUNITY): Payer: Self-pay

## 2016-08-23 ENCOUNTER — Ambulatory Visit (HOSPITAL_COMMUNITY): Payer: Medicare Other

## 2016-08-23 DIAGNOSIS — I129 Hypertensive chronic kidney disease with stage 1 through stage 4 chronic kidney disease, or unspecified chronic kidney disease: Secondary | ICD-10-CM | POA: Diagnosis present

## 2016-08-23 DIAGNOSIS — R531 Weakness: Secondary | ICD-10-CM | POA: Diagnosis not present

## 2016-08-23 DIAGNOSIS — Z85818 Personal history of malignant neoplasm of other sites of lip, oral cavity, and pharynx: Secondary | ICD-10-CM | POA: Diagnosis not present

## 2016-08-23 DIAGNOSIS — N183 Chronic kidney disease, stage 3 (moderate): Secondary | ICD-10-CM | POA: Diagnosis not present

## 2016-08-23 DIAGNOSIS — R221 Localized swelling, mass and lump, neck: Secondary | ICD-10-CM | POA: Diagnosis not present

## 2016-08-23 DIAGNOSIS — D638 Anemia in other chronic diseases classified elsewhere: Secondary | ICD-10-CM | POA: Diagnosis present

## 2016-08-23 DIAGNOSIS — I10 Essential (primary) hypertension: Secondary | ICD-10-CM | POA: Diagnosis not present

## 2016-08-23 DIAGNOSIS — R296 Repeated falls: Secondary | ICD-10-CM | POA: Diagnosis present

## 2016-08-23 DIAGNOSIS — Z6835 Body mass index (BMI) 35.0-35.9, adult: Secondary | ICD-10-CM | POA: Diagnosis not present

## 2016-08-23 DIAGNOSIS — G9341 Metabolic encephalopathy: Secondary | ICD-10-CM | POA: Diagnosis present

## 2016-08-23 DIAGNOSIS — F419 Anxiety disorder, unspecified: Secondary | ICD-10-CM | POA: Diagnosis present

## 2016-08-23 DIAGNOSIS — M1990 Unspecified osteoarthritis, unspecified site: Secondary | ICD-10-CM | POA: Diagnosis present

## 2016-08-23 DIAGNOSIS — F329 Major depressive disorder, single episode, unspecified: Secondary | ICD-10-CM | POA: Diagnosis present

## 2016-08-23 DIAGNOSIS — G9340 Encephalopathy, unspecified: Secondary | ICD-10-CM

## 2016-08-23 DIAGNOSIS — E860 Dehydration: Secondary | ICD-10-CM | POA: Diagnosis not present

## 2016-08-23 DIAGNOSIS — E669 Obesity, unspecified: Secondary | ICD-10-CM | POA: Diagnosis present

## 2016-08-23 DIAGNOSIS — J45909 Unspecified asthma, uncomplicated: Secondary | ICD-10-CM | POA: Diagnosis present

## 2016-08-23 DIAGNOSIS — R1310 Dysphagia, unspecified: Secondary | ICD-10-CM | POA: Diagnosis present

## 2016-08-23 DIAGNOSIS — F17210 Nicotine dependence, cigarettes, uncomplicated: Secondary | ICD-10-CM | POA: Diagnosis present

## 2016-08-23 DIAGNOSIS — G4710 Hypersomnia, unspecified: Secondary | ICD-10-CM | POA: Diagnosis present

## 2016-08-23 DIAGNOSIS — E876 Hypokalemia: Secondary | ICD-10-CM | POA: Diagnosis present

## 2016-08-23 DIAGNOSIS — R2689 Other abnormalities of gait and mobility: Secondary | ICD-10-CM | POA: Diagnosis present

## 2016-08-23 DIAGNOSIS — G8929 Other chronic pain: Secondary | ICD-10-CM | POA: Diagnosis present

## 2016-08-23 DIAGNOSIS — N3281 Overactive bladder: Secondary | ICD-10-CM | POA: Diagnosis present

## 2016-08-23 DIAGNOSIS — M1710 Unilateral primary osteoarthritis, unspecified knee: Secondary | ICD-10-CM | POA: Diagnosis present

## 2016-08-23 DIAGNOSIS — E785 Hyperlipidemia, unspecified: Secondary | ICD-10-CM | POA: Diagnosis present

## 2016-08-23 DIAGNOSIS — Z888 Allergy status to other drugs, medicaments and biological substances status: Secondary | ICD-10-CM | POA: Diagnosis not present

## 2016-08-23 DIAGNOSIS — R404 Transient alteration of awareness: Secondary | ICD-10-CM | POA: Diagnosis not present

## 2016-08-23 LAB — BASIC METABOLIC PANEL
Anion gap: 7 (ref 5–15)
BUN: 27 mg/dL — AB (ref 6–20)
CALCIUM: 10.2 mg/dL (ref 8.9–10.3)
CO2: 27 mmol/L (ref 22–32)
Chloride: 101 mmol/L (ref 101–111)
Creatinine, Ser: 1.14 mg/dL — ABNORMAL HIGH (ref 0.44–1.00)
GFR calc Af Amer: 54 mL/min — ABNORMAL LOW (ref >60)
GFR, EST NON AFRICAN AMERICAN: 47 mL/min — AB (ref >60)
GLUCOSE: 99 mg/dL (ref 65–99)
Potassium: 3.5 mmol/L (ref 3.5–5.1)
Sodium: 135 mmol/L (ref 135–145)

## 2016-08-23 LAB — CBC
HCT: 29.9 % — ABNORMAL LOW (ref 36.0–46.0)
Hemoglobin: 9.8 g/dL — ABNORMAL LOW (ref 12.0–15.0)
MCH: 29.3 pg (ref 26.0–34.0)
MCHC: 32.8 g/dL (ref 30.0–36.0)
MCV: 89.3 fL (ref 78.0–100.0)
Platelets: 325 10*3/uL (ref 150–400)
RBC: 3.35 MIL/uL — ABNORMAL LOW (ref 3.87–5.11)
RDW: 16.2 % — AB (ref 11.5–15.5)
WBC: 12.8 10*3/uL — ABNORMAL HIGH (ref 4.0–10.5)

## 2016-08-23 LAB — CK TOTAL AND CKMB (NOT AT ARMC)
CK TOTAL: 37 U/L — AB (ref 38–234)
CK, MB: 2.7 ng/mL (ref 0.5–5.0)
Relative Index: INVALID (ref 0.0–2.5)

## 2016-08-23 LAB — VITAMIN B12: VITAMIN B 12: 441 pg/mL (ref 180–914)

## 2016-08-23 LAB — CORTISOL: CORTISOL PLASMA: 31 ug/dL

## 2016-08-23 LAB — C-REACTIVE PROTEIN: CRP: 1.6 mg/dL — ABNORMAL HIGH (ref <1.0)

## 2016-08-23 LAB — AMMONIA: Ammonia: 31 umol/L (ref 9–35)

## 2016-08-23 MED ORDER — SIMVASTATIN 20 MG PO TABS: 40.0000 mg | ORAL_TABLET | Freq: Every day | ORAL | Status: DC

## 2016-08-23 MED ORDER — ATORVASTATIN CALCIUM 20 MG PO TABS
20.0000 mg | ORAL_TABLET | Freq: Every day | ORAL | Status: DC
  Administered 2016-08-23 – 2016-08-24 (×2): 20 mg via ORAL
  Filled 2016-08-23 (×2): qty 1

## 2016-08-23 MED ORDER — ENSURE ENLIVE PO LIQD
237.0000 mL | Freq: Two times a day (BID) | ORAL | Status: DC
  Administered 2016-08-23 – 2016-08-25 (×6): 237 mL via ORAL

## 2016-08-23 MED ORDER — CLONAZEPAM 0.5 MG PO TABS
0.5000 mg | ORAL_TABLET | Freq: Two times a day (BID) | ORAL | Status: DC | PRN
Start: 2016-08-23 — End: 2016-08-25
  Administered 2016-08-24 (×2): 0.5 mg via ORAL
  Filled 2016-08-23 (×2): qty 1

## 2016-08-23 MED ORDER — AMLODIPINE BESYLATE 5 MG PO TABS
5.0000 mg | ORAL_TABLET | Freq: Every day | ORAL | Status: DC
  Administered 2016-08-23 – 2016-08-25 (×3): 5 mg via ORAL
  Filled 2016-08-23 (×3): qty 1

## 2016-08-23 NOTE — Evaluation (Signed)
Physical Therapy Evaluation Patient Details Name: Desiree Higgins MRN: [id number] DOB: 1945/10/24 Today's Date: 08/23/2016   History of Present Illness  71 y.o. female with PMH significant for significant for obesity, HTN, HLD, depression, anxiety, chronic pain from OA and asthma; who presented to ED secondary to increase somnolence, AMS and ataxia; admitted for altered mental state: unclear etiology duspecting related to misuse of her medications (especially oxycodone and klonopin).   Clinical Impression  Pt admitted with above diagnosis. Pt currently with functional limitations due to the deficits listed below (see PT Problem List).  Pt will benefit from skilled PT to increase their independence and safety with mobility to allow discharge to the venue listed below.  Pt requiring occasional steadying assist for mobility and cues for safety due to decreased cognition.  May benefit from cane at home if confusion persists and to help spouse mobilize pt especially for stairs into apt.     Follow Up Recommendations Supervision/Assistance - 24 hour;Home health PT    Equipment Recommendations  Cane    Recommendations for Other Services       Precautions / Restrictions Precautions Precautions: Fall      Mobility  Bed Mobility Overal bed mobility: Needs Assistance Bed Mobility: Supine to Sit;Sit to Supine     Supine to sit: Supervision Sit to supine: Supervision   General bed mobility comments: cues required due to decreased cognition  Transfers Overall transfer level: Needs assistance Equipment used: Rolling walker (2 wheeled) Transfers: Sit to/from Stand Sit to Stand: Min guard         General transfer comment: cues required due to decreased cognition  Ambulation/Gait Ambulation/Gait assistance: Min guard Ambulation Distance (Feet): 200 Feet Assistive device: None Gait Pattern/deviations: Step-through pattern;Decreased stride length;Drifts right/left     General Gait  Details: pt pushed IV pole however inconsistently (would forget, easily distracted and leave IV pole behind), multiple instances of unsteadiness requiring slight assist due to LOB, safety cues due to decrreased cogntition  Stairs            Wheelchair Mobility    Modified Rankin (Stroke Patients Only)       Balance Overall balance assessment: History of Falls (falls started with decreased cognition)                                           Pertinent Vitals/Pain Pain Assessment: No/denies pain    Home Living Family/patient expects to be discharged to:: Private residence Living Arrangements: Spouse/significant other   Type of Home: Apartment Home Access: Stairs to enter Entrance Stairs-Rails: Right Entrance Stairs-Number of Steps: flight Home Layout: One level Home Equipment: None      Prior Function Level of Independence: Independent               Hand Dominance        Extremity/Trunk Assessment        Lower Extremity Assessment Lower Extremity Assessment: Generalized weakness    Cervical / Trunk Assessment Cervical / Trunk Assessment: Normal  Communication   Communication: No difficulties  Cognition Arousal/Alertness: Awake/alert Behavior During Therapy: WFL for tasks assessed/performed Overall Cognitive Status: Impaired/Different from baseline Area of Impairment: Safety/judgement;Problem solving         Safety/Judgement: Decreased awareness of safety;Decreased awareness of deficits   Problem Solving: Slow processing;Requires verbal cues General Comments: spouse reports decline in cognition and memory for a couple weeks  General Comments      Exercises     Assessment/Plan    PT Assessment Patient needs continued PT services  PT Problem List Decreased safety awareness;Decreased knowledge of use of DME;Decreased mobility;Decreased balance          PT Treatment Interventions Gait training;DME  instruction;Therapeutic activities;Therapeutic exercise;Functional mobility training;Balance training;Patient/family education;Stair training    PT Goals (Current goals can be found in the Care Plan section)  Acute Rehab PT Goals PT Goal Formulation: With patient/family Time For Goal Achievement: 08/30/16 Potential to Achieve Goals: Good    Frequency Min 3X/week   Barriers to discharge        Co-evaluation               End of Session Equipment Utilized During Treatment: Gait belt Activity Tolerance: Patient tolerated treatment well Patient left: with bed alarm set;in bed;with call bell/phone within reach      Functional Assessment Tool Used: clinical judgement Functional Limitation: Mobility: Walking and moving around Mobility: Walking and Moving Around Current Status [id number]): At least 20 percent but less than 40 percent impaired, limited or restricted Mobility: Walking and Moving Around Goal Status [phone]): At least 1 percent but less than 20 percent impaired, limited or restricted    Time: 1037-1057 PT Time Calculation (min) (ACUTE ONLY): 20 min   Charges:   PT Evaluation $PT Eval Low Complexity: 1 Procedure     PT G Codes:   PT G-Codes **NOT FOR INPATIENT CLASS** Functional Assessment Tool Used: clinical judgement Functional Limitation: Mobility: Walking and moving around Mobility: Walking and Moving Around Current Status [id number]): At least 20 percent but less than 40 percent impaired, limited or restricted Mobility: Walking and Moving Around Goal Status [phone]): At least 1 percent but less than 20 percent impaired, limited or restricted    Marielle Mantione,KATHrine E 08/23/2016, 11:33 AM Carmelia Bake, PT, DPT 08/23/2016 Pager: [phone]

## 2016-08-23 NOTE — ED Provider Notes (Signed)
[facility] DEPT Provider Note   CSN: [id number] Arrival date & time: 08/22/16  1055     History   Chief Complaint Chief Complaint  Patient presents with  . Weakness    HPI Desiree Higgins is a 71 y.o. female.  HPI Patient presents to the emergency department with 3 weeks' worth of increasing confusion, weakness with nausea, vomiting, diarrhea intermittently.  The patient has also had several falls over this timeframe.  The patient's husband states she is confused and has started taking more medications because she forgets that she has taken them and appears more somnolent after taking the extra doses of medication.  Husband states that her doctor was working her up for school and lymph nodes in her neck. The patient denies chest pain, shortness of breath, headache,blurred vision, neck pain, fever, cough, numbness, dizziness, anorexia, edema, abdominal pain,rash, back pain, dysuria, hematemesis, bloody stool, near syncope, or syncope. Past Medical History:  Diagnosis Date  . ALLERGIC RHINITIS 09/22/2007   Qualifier: Diagnosis of  By: Jenny Reichmann MD, Hunt Oris   . ANXIETY 09/22/2007   Qualifier: Diagnosis of  By: Jenny Reichmann MD, Hunt Oris   . DEPRESSION 09/22/2007   Qualifier: Diagnosis of  By: Jenny Reichmann MD, Hunt Oris   . HYPERLIPIDEMIA 09/22/2007   Qualifier: Diagnosis of  By: Jenny Reichmann MD, Hunt Oris   . HYPERTENSION 09/22/2007   Qualifier: Diagnosis of  By: Jenny Reichmann MD, Hunt Oris   . Impaired glucose tolerance 12/19/2012  . OSTEOARTHRITIS, KNEE 11/21/2008   Qualifier: Diagnosis of  By: Jenny Reichmann MD, [place] BLADDER 09/22/2007   Qualifier: Diagnosis of  By: Jenny Reichmann MD, Hunt Oris   . Smoker 12/26/2013  . Unspecified asthma(493.90) 12/26/2013  . VITAMIN D DEFICIENCY 06/04/2010   Qualifier: Diagnosis of  By: Jenny Reichmann MD, Hunt Oris     Patient Active Problem List   Diagnosis Date Noted  . CKD (chronic kidney disease) stage 3, GFR 30-59 ml/min 08/22/2016  . Dehydration 08/22/2016  . Altered mental state 08/22/2016  .  Altered awareness, transient 08/22/2016  . Weakness   . Sore throat 08/12/2016  . Mass of right side of neck 08/12/2016  . Hypersomnolence 08/12/2016  . Loss of weight 08/12/2016  . Back pain 08/12/2016  . Acute upper respiratory infection 02/25/2016  . Chronic low back pain 02/25/2016  . Cat bite 12/07/2015  . Asthma 12/26/2013  . Smoker 12/26/2013  . Impaired glucose tolerance 12/19/2012  . Osteopenia 11/10/2011  . Preventative health care 11/09/2011  . Vitamin D deficiency 06/04/2010  . FATIGUE 12/03/2009  . 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 MENOPAUSAL SYNDROME 11/21/2008  . OSTEOARTHRITIS, KNEE 11/21/2008  . Hyperlipidemia 09/22/2007  . Anxiety state 09/22/2007  . DEPRESSION 09/22/2007  . Essential hypertension 09/22/2007  . Allergic rhinitis 09/22/2007  . OVERACTIVE BLADDER 09/22/2007    Past Surgical History:  Procedure Laterality Date  . ABDOMINAL HYSTERECTOMY    . BREAST BIOPSY  1972  . CATARACT EXTRACTION     right  . CHOLECYSTECTOMY    . OOPHORECTOMY    . TONSILLECTOMY      OB History    No data available       Home Medications    Prior to Admission medications   Medication Sig Start Date End Date Taking? Authorizing Provider  amoxicillin (AMOXIL) 875 MG tablet Take 875 mg by mouth daily. 08/18/16 08/24/16 Yes Historical Provider, MD  aspirin 81 MG EC tablet Take 81 mg by mouth daily. Swallow whole.   Yes Historical Provider,  MD  clonazePAM (KLONOPIN) 0.5 MG tablet TAKE ONE TABLET BY MOUTH TWICE DAILY AS NEEDED 04/13/16  Yes Biagio Borg, MD  DULoxetine (CYMBALTA) 60 MG capsule Take 1 capsule (60 mg total) by mouth daily. 03/16/16  Yes Biagio Borg, MD  losartan-hydrochlorothiazide (HYZAAR) 100-25 MG tablet Take 1 tablet by mouth daily. 03/16/16  Yes Biagio Borg, MD  montelukast (SINGULAIR) 10 MG tablet Take 1 tablet (10 mg total) by mouth daily with breakfast. 05/06/16  Yes Biagio Borg, MD  oxyCODONE-acetaminophen (PERCOCET) 10-325 MG per tablet Take 1 tablet by mouth  every 4 (four) hours as needed.   Yes Historical Provider, MD  PROAIR HFA 108 [phone] Base) MCG/ACT inhaler INHALE TWO PUFFS INTO LUNGS EVERY 6 HOURS AS NEEDED FOR WHEEZING AND FOR SHORTNESS OF BREATH 06/14/16  Yes Biagio Borg, MD  simvastatin (ZOCOR) 40 MG tablet Take 1 tablet (40 mg total) by mouth daily. 03/16/16  Yes Biagio Borg, MD  cholestyramine light (PREVALITE) 4 g packet 1 packet by mouth twice per day Patient not taking: Reported on 08/22/2016 03/12/16   Biagio Borg, MD  cholestyramine light (PREVALITE) 4 g packet Take 1 packet (4 g total) by mouth 2 (two) times daily. Patient not taking: Reported on 08/22/2016 03/16/16   Biagio Borg, MD  predniSONE (DELTASONE) 10 MG tablet 3 tabs by mouth per day for 3 days,2tabs per day for 3 days,1tab per day for 3 days Patient not taking: Reported on 08/22/2016 03/16/16   Biagio Borg, MD  triamcinolone (NASACORT) 55 MCG/ACT nasal inhaler Place 2 sprays into the nose daily. Patient not taking: Reported on 08/22/2016 12/19/12   Biagio Borg, MD    Family History Family History  Problem Relation Age of Onset  . Cancer Mother     breast    Social History Social History  Substance Use Topics  . Smoking status: Current Some Day Smoker  . Smokeless tobacco: Never Used  . Alcohol use No     Allergies   Ace inhibitors   Review of Systems Review of Systems  All other systems negative except as documented in the HPI. All pertinent positives and negatives as reviewed in the HPI. Physical Exam Updated Vital Signs BP (!) 142/60 (BP Location: Right Arm)   Pulse 82   Temp 98.6 F (37 C) (Oral)   Resp 17   Ht 5\' 9"  (1.753 m)   Wt 107.5 kg   SpO2 94%   BMI 35.00 kg/m   Physical Exam  Constitutional: She appears well-developed and well-nourished. No distress.  HENT:  Head: Normocephalic and atraumatic.  Mouth/Throat: Oropharynx is clear and moist.  Eyes: Pupils are equal, round, and reactive to light.  Neck: Normal range of motion. Neck supple.   Cardiovascular: Normal rate, regular rhythm and normal heart sounds.  Exam reveals no gallop and no friction rub.   No murmur heard. Pulmonary/Chest: Effort normal and breath sounds normal. No respiratory distress. She has no wheezes.  Abdominal: Soft. Bowel sounds are normal. She exhibits no distension. There is no tenderness.  Lymphadenopathy:       Head (right side): Submandibular adenopathy present. No submental adenopathy present.       Head (left side): Submandibular adenopathy present. No submental adenopathy present.    She has cervical adenopathy.       Right cervical: Superficial cervical adenopathy present.       Left cervical: Superficial cervical adenopathy present.  Neurological: She is alert. She  exhibits normal muscle tone. Coordination normal.  Skin: Skin is warm and dry. Capillary refill takes less than 2 seconds. No rash noted. No erythema.  Psychiatric: She has a normal mood and affect. Her behavior is normal.  Nursing note and vitals reviewed.    ED Treatments / Results  Labs (all labs ordered are listed, but only abnormal results are displayed) Labs Reviewed  COMPREHENSIVE METABOLIC PANEL - Abnormal; Notable for the following:       Result Value   Chloride 100 (*)    Glucose, Bld 103 (*)    BUN 33 (*)    Creatinine, Ser 1.43 (*)    Calcium 10.4 (*)    Albumin 2.9 (*)    GFR calc non Af Amer 36 (*)    GFR calc Af Amer 41 (*)    All other components within normal limits  CBC WITH DIFFERENTIAL/PLATELET - Abnormal; Notable for the following:    WBC 13.1 (*)    RBC 3.64 (*)    Hemoglobin 10.5 (*)    HCT 32.6 (*)    RDW 16.5 (*)    Neutro Abs 8.8 (*)    Eosinophils Absolute 0.9 (*)    All other components within normal limits  ACETAMINOPHEN LEVEL - Abnormal; Notable for the following:    Acetaminophen (Tylenol), Serum <10 (*)    All other components within normal limits  C-REACTIVE PROTEIN - Abnormal; Notable for the following:    CRP 1.6 (*)    All  other components within normal limits  TSH - Abnormal; Notable for the following:    TSH 0.258 (*)    All other components within normal limits  BASIC METABOLIC PANEL - Abnormal; Notable for the following:    BUN 27 (*)    Creatinine, Ser 1.14 (*)    GFR calc non Af Amer 47 (*)    GFR calc Af Amer 54 (*)    All other components within normal limits  CBC - Abnormal; Notable for the following:    WBC 12.8 (*)    RBC 3.35 (*)    Hemoglobin 9.8 (*)    HCT 29.9 (*)    RDW 16.2 (*)    All other components within normal limits  CK TOTAL AND CKMB (NOT AT [facility]) - Abnormal; Notable for the following:    Total CK 37 (*)    All other components within normal limits  URINALYSIS, ROUTINE W REFLEX MICROSCOPIC  TROPONIN I  CORTISOL  VITAMIN B12  MAGNESIUM  PHOSPHORUS  AMMONIA  I-STAT CG4 LACTIC ACID, ED    EKG  EKG Interpretation None       Radiology Dg Chest 2 View  Result Date: 08/22/2016 CLINICAL DATA:  AMS and weakness of Bilateral legs per husband x about 2 weeks.Husband stated to me he believes pt has been taking too many of her prescriptions than what is prescribed.Asthma, HTN, some day smokerSx: breast biopsy -1972 EXAM: CHEST  2 VIEW COMPARISON:  08/12/2016 FINDINGS: Cardiac silhouette is mildly enlarged. No mediastinal or hilar masses. No convincing adenopathy. There are prominent bronchovascular markings bilaterally, stable. No evidence of pneumonia or pulmonary edema. No pleural effusion or pneumothorax. Skeletal structures are intact. IMPRESSION: No active cardiopulmonary disease. Electronically Signed   By: Lajean Manes M.D.   On: 08/22/2016 13:39   Ct Head Wo Contrast  Result Date: 08/22/2016 CLINICAL DATA:  Three day history of weakness. EXAM: CT HEAD WITHOUT CONTRAST TECHNIQUE: Contiguous axial images were obtained from the base of  the skull through the vertex without intravenous contrast. COMPARISON:  None. FINDINGS: Brain: Study is mildly degraded by patient motion. No  acute infarct, hemorrhage, or mass lesion is present. Mild generalized atrophy and white matter disease is present. The ventricles are proportionate to the degree of atrophy. No significant extra-axial fluid collection is present. Vascular: Atherosclerotic calcifications are present in the cavernous internal carotid arteries and at the dural margin of the vertebral arteries. No hyperdense vessel is present. Skull: The calvarium is intact. No significant extracranial soft tissue lesions are present. Sinuses/Orbits: A polyp or mucous retention cyst is present in the right maxillary sinus with associated mucosal thickening. The paranasal sinuses and mastoid air cells are otherwise clear. IMPRESSION: 1. Mild generalized atrophy and white matter disease likely reflects the sequela of chronic microvascular ischemia. 2. No acute intracranial abnormality. 3. Atherosclerosis. 4. Right maxillary sinus disease. Electronically Signed   By: San Morelle M.D.   On: 08/22/2016 15:33   Ct Soft Tissue Neck W Contrast  Result Date: 08/22/2016 CLINICAL DATA:  Neck pain.  History of tonsil cancer. EXAM: CT NECK WITH CONTRAST TECHNIQUE: Multidetector CT imaging of the neck was performed using the standard protocol following the bolus administration of intravenous contrast. CONTRAST:  84mL ISOVUE-300 IOPAMIDOL (ISOVUE-300) INJECTION 61% COMPARISON:  None. FINDINGS: Pharynx and larynx: No focal mucosal or submucosal lesions are present. The nasopharynx, oropharynx, and hypopharynx are clear. There is some fullness at the level of the tonsils, left greater than right. Vocal cords are midline and symmetric. Salivary glands: Enhancing nodules are present at the inferior aspect of the parotid glands bilaterally. The upper parotid glands are within normal limits. The submandibular glands are normal bilaterally. Thyroid: Negative Lymph nodes: A right lateral peripherally enhancing nodal mass that measures 2.2 x 1.8 x 2.5 cm. Just  inferior and anterior is a 1.5 cm peripherally enhancing lesion. The areas of the enhancement extending to the right parotid gland. There are several smaller rounded right level 2 lymph nodes. The largest measures 13 mm. A peripherally enhancing right level 3 lymph node posteriorly measures 10 mm. A rounded right level 3 lymph node measures 10 mm. Smaller posterior right level 3 and level 4 lymph nodes are present. Multiple suspicious left-sided lymph nodes are present as well. A peripherally enhancing node or 2 nodes are present along the medial aspect of the inferior left parotid. A rounded left level 2 lymph node measures 12 mm. An ovoid posterior left level 3 lymph node measures 15 mm. A left level 4 lymph node measures 12 mm. Right peripherally enhancing left supraclavicular node measures 2.01.7 cm on image 75 of series 6. There are 2 posterior triangle lymph nodes on the left measuring 15 and 19 mm respectively. Vascular: Atherosclerotic calcifications are present at the carotid bifurcations bilaterally. There is potential stenosis bilaterally. Limited intracranial: Limited imaging the brain is unremarkable. Atherosclerotic calcifications are present. Visualized orbits: A right lens replacement is present. The globes and orbits are otherwise within normal limits. Mastoids and visualized paranasal sinuses: A large polyp or mucous retention cyst is noted inferiorly in the right maxillary sinus. There is some mucosal thickening associated. The paranasal sinuses and mastoid air cells are otherwise clear. Skeleton: Degenerative endplate changes are most severe at C5-6 and C6-7. Uncovertebral spurring results in right greater than left foraminal narrowing. Left greater than right foraminal narrowing is present at C4-5 due to facet spurring. Upper chest: Dependent atelectasis is present at the lung apices bilaterally. Atherosclerotic calcifications are present at  the arch. The upper mediastinum is otherwise within  normal limits. IMPRESSION: 1. Extensive bilateral cervical adenopathy concerning metastatic disease. Lymphoproliferative disease is considered less likely. 2. Mild fullness in the tonsillar area bilaterally may reflect prior treatment versus recurrent disease. 3. Bilateral atherosclerosis. 4. Right maxillary sinus disease. Electronically Signed   By: San Morelle M.D.   On: 08/22/2016 15:28   Mr Brain W And Wo Contrast  Result Date: 08/22/2016 CLINICAL DATA:  Altered mental status, recent falls. General weakness. EXAM: MRI HEAD WITHOUT AND WITH CONTRAST TECHNIQUE: Multiplanar, multiecho pulse sequences of the brain and surrounding structures were obtained without and with intravenous contrast. CONTRAST:  39mL MULTIHANCE GADOBENATE DIMEGLUMINE 529 MG/ML IV SOLN COMPARISON:  CT neck earlier today. FINDINGS: Significant motion degradation. Study is of marginal diagnostic utility. Some type of susceptibility artifact at the skull bases, leads to artifactual distortion of much of the scan. Brain: No evidence for acute infarction, hemorrhage, mass lesion, hydrocephalus, or extra-axial fluid. Global atrophy. Chronic microvascular ischemic change. Post infusion, no abnormal enhancement of the brain or meninges. Vascular: Normal flow voids. Skull and upper cervical spine: Normal marrow signal. Sinuses/Orbits: Near complete opacification RIGHT maxillary sinus due to a polyp or retention cyst. No layering fluid. Other: Metastatic adenopathy better visualized on recent CT. Chronic RIGHT mastoid fluid. IMPRESSION: Scan is of marginal diagnostic utility, from the standpoint of patient motion as well as some type of susceptibility artifact which obscures much of the lower slices. No visible restricted diffusion. No definite abnormal postcontrast enhancement. Atrophy and small vessel disease. Electronically Signed   By: Staci Righter M.D.   On: 08/22/2016 18:27    Procedures Procedures (including critical care  time)  Medications Ordered in ED Medications  montelukast (SINGULAIR) tablet 10 mg (10 mg Oral Given 08/23/16 0937)  DULoxetine (CYMBALTA) DR capsule 60 mg (60 mg Oral Given 08/23/16 0937)  aspirin EC tablet 81 mg (81 mg Oral Given 08/23/16 0937)  heparin injection 5,000 Units (5,000 Units Subcutaneous Given 08/23/16 1435)  0.9 %  sodium chloride infusion ( Intravenous New Bag/Given 08/22/16 2207)  acetaminophen (TYLENOL) tablet 650 mg (not administered)    Or  acetaminophen (TYLENOL) suppository 650 mg (not administered)  ondansetron (ZOFRAN) tablet 4 mg (not administered)    Or  ondansetron (ZOFRAN) injection 4 mg (not administered)  naloxone (NARCAN) injection 0.2 mg (not administered)  feeding supplement (ENSURE ENLIVE) (ENSURE ENLIVE) liquid 237 mL (237 mLs Oral Given 08/23/16 1502)  clonazePAM (KLONOPIN) tablet 0.5 mg (not administered)  amLODipine (NORVASC) tablet 5 mg (not administered)  atorvastatin (LIPITOR) tablet 20 mg (not administered)  iopamidol (ISOVUE-300) 61 % injection (60 mLs Intravenous Contrast Given 08/22/16 1503)  gadobenate dimeglumine (MULTIHANCE) injection 10 mL (10 mLs Intravenous Contrast Given 08/22/16 1755)     Initial Impression / Assessment and Plan / ED Course  I have reviewed the triage vital signs and the nursing notes.  Pertinent labs & imaging results that were available during my care of the patient were reviewed by me and considered in my medical decision making (see chart for details).  Clinical Course     Patient will need admission to the hospital for the increased confusion that started to differentiate between medication versus some neurological issue versus other acute process.  I spoke with the [facility], who will admit the patient admitted.  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

## 2016-08-23 NOTE — Progress Notes (Signed)
Pt refused [id number].  Education provided to Pt.  RT will continue to monitor as needed.

## 2016-08-23 NOTE — Progress Notes (Addendum)
Patient ID: Desiree Higgins, female   DOB: 1946/04/02, 71 y.o.   MRN: [id number]  PROGRESS NOTE    Desiree Higgins  [id number] DOB: 08-07-1946 DOA: 08/22/2016  PCP: [name], MD   Brief Narrative:  71 y.o. female with past medical history significant for hypertension, dyslipidemia, depression, chronic pain from osteoarthritis and asthma who presented to ED with gait imbalance and falls, increased somnolence. The symptoms started about 3 weeks prior to this presentation and have progressed since. Per pt husband, patient is confused, forgetful and unable to remember simple tasks for ex., if she took her meds or not. On admission, pt was hemodynamically stable. CT neck showed extensive bilateral cervical adenopathy concerning metastatic disease. Lmphoproliferative disease is considered less likely. Mild fullness in the tonsillar area bilaterally may reflect prior treatment versus recurrent disease. CT head showed no acute intracranial findings. MRI brain was not of a good study due to motion artifact.   Assessment & Plan:   Principal Problem:   Acute metabolic encephalopathy - CT head showed no acute intracranial findings. MRI brain was not of a good study due to motion artifact.  - Unclear if right side neck mass may have something to do with gait imbalance but as noted MRI was not a good study to evaluate this - Obtain PT eval  Active Problems: Right side neck mass - CT neck showed extensive bilateral cervical adenopathy concerning metastatic disease. Lmphoproliferative disease is considered less likely. Mild fullness in the tonsillar area bilaterally may reflect prior treatment versus recurrent disease. - Sent message to Dr. [name] that pt was admitted - Placed order for bx of right neck mass  Depression - Continue Cymbalta  CKD (chronic kidney disease) stage 3, GFR 30-59 ml/min - Cr 1.43 on admission and better with hydration, 1.14  Anemia of chronic disease - Due to CKD -  Hemoglobin stable at 9.8  Essential hypertension - Losartan-hctz on hold due to CKD - Started Norvasc 5 mg daily   Dyslipidemia - Resume statin therapy    DVT prophylaxis: Heparin subQ Code Status: full code  Family Communication: husband at the bedside this am Disposition Plan: not yet stable for discharge, needs evaluation of right neck mass, weakness and falls    Consultants:   Sent message to Dr. [name] to let him know of pt admission   Procedures:   None  Antimicrobials:   None   Subjective: No overnight events.   Objective: Vitals:   08/22/16 1922 08/22/16 2133 08/22/16 2155 08/23/16 0614  BP: 138/68 141/72 135/83 (!) 147/73  Pulse: 90 92 92 85  Resp: 18 18 18 18   Temp:   98.4 F (36.9 C) 98.9 F (37.2 C)  TempSrc:   Oral Oral  SpO2: 90% 91% 95% 94%  Weight:      Height:        Intake/Output Summary (Last 24 hours) at 08/23/16 1406 Last data filed at 08/23/16 1049  Gross per 24 hour  Intake           411.25 ml  Output             2200 ml  Net         -1788.75 ml   Filed Weights   08/22/16 1101  Weight: 107.5 kg (237 lb)    Examination:  General exam: Appears calm and comfortable; right side neck mass, firm to palpation, nn tender  Respiratory system: Clear to auscultation. Respiratory effort normal. Cardiovascular system: S1 & S2 heard,  RRR. No JVD, murmurs, rubs, gallops or clicks. No pedal edema. Gastrointestinal system: Abdomen is nondistended, soft and nontender. No organomegaly or masses felt. Normal bowel sounds heard. Central nervous system: Alert and oriented. No focal neurological deficits. Extremities: Symmetric 5 x 5 power. Skin: No rashes, lesions or ulcers Psychiatry: Judgement and insight appear normal. Mood & affect appropriate.   Data Reviewed: I have personally reviewed following labs and imaging studies  CBC:  Recent Labs Lab 08/22/16 1324 08/23/16 0419  WBC 13.1* 12.8*  NEUTROABS 8.8*  --   HGB 10.5*  9.8*  HCT 32.6* 29.9*  MCV 89.6 89.3  PLT 344 [id number]   Basic Metabolic Panel:  Recent Labs Lab 08/22/16 1324 08/22/16 2212 08/23/16 0419  NA 135  --  135  K 3.5  --  3.5  CL 100*  --  101  CO2 27  --  27  GLUCOSE 103*  --  99  BUN 33*  --  27*  CREATININE 1.43*  --  1.14*  CALCIUM 10.4*  --  10.2  MG  --  1.8  --   PHOS  --  2.6  --    GFR: Estimated Creatinine Clearance: 58.2 mL/min (by C-G formula based on SCr of 1.14 mg/dL (H)). Liver Function Tests:  Recent Labs Lab 08/22/16 1324  AST 16  ALT 16  ALKPHOS 84  BILITOT 0.5  PROT 7.7  ALBUMIN 2.9*   No results for input(s): LIPASE, AMYLASE in the last 168 hours.  Recent Labs Lab 08/23/16 0003  AMMONIA 31   Coagulation Profile: No results for input(s): INR, PROTIME in the last 168 hours. Cardiac Enzymes:  Recent Labs Lab 08/22/16 1324 08/22/16 2333  CKTOTAL  --  37*  CKMB  --  2.7  TROPONINI <0.03  --    BNP (last 3 results) No results for input(s): PROBNP in the last 8760 hours. HbA1C: No results for input(s): HGBA1C in the last 72 hours. CBG: No results for input(s): GLUCAP in the last 168 hours. Lipid Profile: No results for input(s): CHOL, HDL, LDLCALC, TRIG, CHOLHDL, LDLDIRECT in the last 72 hours. Thyroid Function Tests:  Recent Labs  08/22/16 2212  TSH 0.258*   Anemia Panel:  Recent Labs  08/22/16 2212  VITAMINB12 441   Urine analysis:    Component Value Date/Time   COLORURINE YELLOW 08/22/2016 [place] 08/22/2016 1246   LABSPEC 1.016 08/22/2016 1246   PHURINE 5.0 08/22/2016 1246   [place] 08/22/2016 1246   GLUCOSEU NEGATIVE 08/12/2016 1720   HGBUR NEGATIVE 08/22/2016 [place] 08/22/2016 1246   [place] 08/22/2016 1246   PROTEINUR NEGATIVE 08/22/2016 1246   UROBILINOGEN 0.2 08/12/2016 1720   NITRITE NEGATIVE 08/22/2016 1246   LEUKOCYTESUR NEGATIVE 08/22/2016 1246   Sepsis  Labs: @LABRCNTIP (procalcitonin:4,lacticidven:4)   )No results found for this or any previous visit (from the past 240 hour(s)).    Radiology Studies: Dg Chest 2 View Result Date: 08/22/2016 No active cardiopulmonary disease.   Ct Head Wo Contrast 1. Mild generalized atrophy and white matter disease likely reflects the sequela of chronic microvascular ischemia. 2. No acute intracranial abnormality. 3. Atherosclerosis. 4. Right maxillary sinus disease.   Ct Soft Tissue Neck W Contrast Result Date: 08/22/2016 1. Extensive bilateral cervical adenopathy concerning metastatic disease. Lymphoproliferative disease is considered less likely. 2. Mild fullness in the tonsillar area bilaterally may reflect prior treatment versus recurrent disease. 3. Bilateral atherosclerosis. 4. Right maxillary sinus disease.   Mr  Brain W And Wo Contrast Result Date: 1/14/2018Scan is of marginal diagnostic utility, from the standpoint of patient motion as well as some type of susceptibility artifact which obscures much of the lower slices. No visible restricted diffusion. No definite abnormal postcontrast enhancement. Atrophy and small vessel disease.    Scheduled Meds: . aspirin EC  81 mg Oral Daily  . DULoxetine  60 mg Oral Daily  . feeding supplement (ENSURE ENLIVE)  237 mL Oral BID BM  . heparin  5,000 Units Subcutaneous Q8H  . montelukast  10 mg Oral Q breakfast   Continuous Infusions: . sodium chloride 75 mL/hr at 08/22/16 2207     LOS: 0 days    Time spent: 25 minutes  Greater than 50% of the time spent on counseling and coordinating the care.   Leisa Lenz, MD [facility] Pager [phone]  If 7PM-7AM, please contact night-coverage [email or web address] Password [email or web address] 08/23/2016, 2:06 PM

## 2016-08-23 NOTE — Telephone Encounter (Signed)
Did not see note until Monday. Called patient. Unable to reach left message to give [place] a call back.

## 2016-08-23 NOTE — Progress Notes (Signed)
Patient ID: Desiree Higgins, female   DOB: 07-19-1946, 72 y.o.   MRN: [id number]   IR consulted for evaluation of neck mass for possible biopsy.  Cervical lymphadenopathy noted on CT scan.  Poor quality MR of brain due to motion artifact. Patient has been ordered for a PET scan which will likely be completed as outpatient.   Discussed case with Dr. Vernard Gambles.  Best approach would be to await results of PET scan prior to biopsy for better assessment of pathology and guide biopsy.  Brynda Greathouse, MS RD PA-C 08/23/16 3:31 PM

## 2016-08-24 ENCOUNTER — Telehealth: Payer: Self-pay | Admitting: Emergency Medicine

## 2016-08-24 LAB — CBC
HEMATOCRIT: 30.7 % — AB (ref 36.0–46.0)
Hemoglobin: 10.1 g/dL — ABNORMAL LOW (ref 12.0–15.0)
MCH: 29.3 pg (ref 26.0–34.0)
MCHC: 32.9 g/dL (ref 30.0–36.0)
MCV: 89 fL (ref 78.0–100.0)
Platelets: 312 10*3/uL (ref 150–400)
RBC: 3.45 MIL/uL — ABNORMAL LOW (ref 3.87–5.11)
RDW: 16.3 % — ABNORMAL HIGH (ref 11.5–15.5)
WBC: 11.8 10*3/uL — ABNORMAL HIGH (ref 4.0–10.5)

## 2016-08-24 LAB — BASIC METABOLIC PANEL
Anion gap: 8 (ref 5–15)
BUN: 20 mg/dL (ref 6–20)
CALCIUM: 10.1 mg/dL (ref 8.9–10.3)
CO2: 26 mmol/L (ref 22–32)
CREATININE: 0.86 mg/dL (ref 0.44–1.00)
Chloride: 100 mmol/L — ABNORMAL LOW (ref 101–111)
GFR calc non Af Amer: >60 mL/min (ref >60)
Glucose, Bld: 104 mg/dL — ABNORMAL HIGH (ref 65–99)
Potassium: 3.3 mmol/L — ABNORMAL LOW (ref 3.5–5.1)
SODIUM: 134 mmol/L — AB (ref 135–145)

## 2016-08-24 MED ORDER — POTASSIUM CHLORIDE CRYS ER 20 MEQ PO TBCR
40.0000 meq | EXTENDED_RELEASE_TABLET | Freq: Once | ORAL | Status: AC
  Administered 2016-08-24: 40 meq via ORAL
  Filled 2016-08-24: qty 2

## 2016-08-24 NOTE — Care Management Note (Signed)
Case Management Note  Patient Details  Name: Desiree Higgins MRN: [id number] Date of Birth: 04/03/1946  Subjective/Objective:                  AMS and ataxia Action/Plan: Discharge plannning Expected Discharge Date:                  Expected Discharge Plan:  Home w Home Health Services  In-House Referral:     Discharge planning Services  CM Consult  Post Acute Care Choice:  Home Health Choice offered to:  Patient, Spouse  DME Arranged:  3-N-1, Walker rolling DME Agency:  [facility]  HH Arranged:  RN, PT, OT, Nurse's Aide HH Agency:  [facility]  Status of Service:  Completed, signed off  If discussed at Long Length of Stay Meetings, dates discussed:    Additional Comments: CM met with pt and pt's spouse, [name], in room to offer choice of home health agency. Family choose [facility] to render HHPt/OT/RN/Aide.  Referral called to [facility] rep, Kimberly.  CM notified [facility] DME rep, shannon to please deliver the rolling walker and 3n1 to room.  CM gave [name] a Private Duty List Agency and [name] verbalized understanding this is an out of pocket expense of $25-30/hour and insurance does not cover this expense.  No other CM needs were communicated. ,  Christine, RN 08/24/2016, 11:49 AM  

## 2016-08-24 NOTE — Progress Notes (Signed)
Patient ID: Desiree Higgins, female   DOB: Jul 06, 1946, 71 y.o.   MRN: [id number]  PROGRESS NOTE    Desiree Higgins  [id number] DOB: [birth year] DOA: 08/22/2016  PCP: [name], MD   Brief Narrative:  71 y.o. female with past medical history significant for hypertension, dyslipidemia, depression, chronic pain from osteoarthritis and asthma who presented to ED with gait imbalance and falls, increased somnolence. The symptoms started about 3 weeks prior to this presentation and have progressed since. Per pt husband, patient is confused, forgetful and unable to remember simple tasks for ex., if she took her meds or not. On admission, pt was hemodynamically stable. CT neck showed extensive bilateral cervical adenopathy concerning metastatic disease. Lmphoproliferative disease is considered less likely. Mild fullness in the tonsillar area bilaterally may reflect prior treatment versus recurrent disease. CT head showed no acute intracranial findings. MRI brain was not of a good study due to motion artifact.   Assessment & Plan:   Principal Problem:   Acute metabolic encephalopathy - CT head showed no acute intracranial findings. MRI brain was not of a good study due to motion artifact.  - Unclear if right side neck mass may have something to do with gait imbalance but as noted MRI was not a good study to evaluate this - PT eval pending as of this am  Active Problems: Right side neck mass - CT neck showed extensive bilateral cervical adenopathy concerning metastatic disease. Lmphoproliferative disease is considered less likely. Mild fullness in the tonsillar area bilaterally may reflect prior treatment versus recurrent disease. - Sent message to Dr. [name] that pt was admitted - Placed order for bx of right neck mass but per IR prefer that pt first undergoes PET scan which can be done on outpt basis only  Depression - Continue Cymbalta  CKD (chronic kidney disease) stage 3, GFR 30-59  ml/min - Cr 1.43 on admission - Cr normalized with IV fluids   Anemia of chronic disease - Due to CKD - Hemoglobin stable at 9.8, 10.1  Essential hypertension - Losartan-hctz on hold due to CKD - Started Norvasc 5 mg daily   Hypokalemia - Supplemented   Dyslipidemia - Continue statin therapy    DVT prophylaxis: Heparin subQ Code Status: full code  Family Communication: husband at the bedside this am Disposition Plan: not yet stable for discharge, needs PT eval for safe discharge plan    Consultants:   Sent message to Dr. [name] to let him know of pt admission   IR  Procedures:   None  Antimicrobials:   None   Subjective: No overnight events.   Objective: Vitals:   08/24/16 0446 08/24/16 0833 08/24/16 1021 08/24/16 1025  BP: 139/75 127/84 (!) 140/103 133/67  Pulse: 78 65 88 88  Resp: 17 18 18    Temp: 99 F (37.2 C) 98.6 F (37 C) 98.5 F (36.9 C)   TempSrc: Oral Oral Oral   SpO2: 94% 96% 95%   Weight:      Height:        Intake/Output Summary (Last 24 hours) at 08/24/16 1312 Last data filed at 08/24/16 0923  Gross per 24 hour  Intake             1108 ml  Output             1050 ml  Net               58 ml   Filed Weights   08/22/16 1101  Weight: 107.5 kg (237 lb)    Examination:  General exam: Appears ill, no distress  Respiratory system: Clear to auscultation. No wheezing  Cardiovascular system: S1 & S2 heard, Rate controlled  Gastrointestinal system: (+) BS< non tender abdomen  Central nervous system: No focal neurological deficits. Extremities: No tenderness, palpable pulses  Skin: No rashes, lesions or ulcers, skin is warm and dry  Psychiatry: Mood & affect appropriate.   Data Reviewed: I have personally reviewed following labs and imaging studies  CBC:  Recent Labs Lab 08/22/16 1324 08/23/16 0419 08/24/16 0438  WBC 13.1* 12.8* 11.8*  NEUTROABS 8.8*  --   --   HGB 10.5* 9.8* 10.1*  HCT 32.6* 29.9* 30.7*  MCV  89.6 89.3 89.0  PLT 344 325 [id number]   Basic Metabolic Panel:  Recent Labs Lab 08/22/16 1324 08/22/16 2212 08/23/16 0419 08/24/16 0438  NA 135  --  135 134*  K 3.5  --  3.5 3.3*  CL 100*  --  101 100*  CO2 27  --  27 26  GLUCOSE 103*  --  99 104*  BUN 33*  --  27* 20  CREATININE 1.43*  --  1.14* 0.86  CALCIUM 10.4*  --  10.2 10.1  MG  --  1.8  --   --   PHOS  --  2.6  --   --    GFR: Estimated Creatinine Clearance: 77.2 mL/min (by C-G formula based on SCr of 0.86 mg/dL). Liver Function Tests:  Recent Labs Lab 08/22/16 1324  AST 16  ALT 16  ALKPHOS 84  BILITOT 0.5  PROT 7.7  ALBUMIN 2.9*   No results for input(s): LIPASE, AMYLASE in the last 168 hours.  Recent Labs Lab 08/23/16 0003  AMMONIA 31   Coagulation Profile: No results for input(s): INR, PROTIME in the last 168 hours. Cardiac Enzymes:  Recent Labs Lab 08/22/16 1324 08/22/16 2333  CKTOTAL  --  37*  CKMB  --  2.7  TROPONINI <0.03  --    BNP (last 3 results) No results for input(s): PROBNP in the last 8760 hours. HbA1C: No results for input(s): HGBA1C in the last 72 hours. CBG: No results for input(s): GLUCAP in the last 168 hours. Lipid Profile: No results for input(s): CHOL, HDL, LDLCALC, TRIG, CHOLHDL, LDLDIRECT in the last 72 hours. Thyroid Function Tests:  Recent Labs  08/22/16 2212  TSH 0.258*   Anemia Panel:  Recent Labs  08/22/16 2212  VITAMINB12 441   Urine analysis:    Component Value Date/Time   COLORURINE YELLOW 08/22/2016 [place] 08/22/2016 1246   LABSPEC 1.016 08/22/2016 1246   PHURINE 5.0 08/22/2016 1246   [place] 08/22/2016 1246   GLUCOSEU NEGATIVE 08/12/2016 1720   HGBUR NEGATIVE 08/22/2016 [place] 08/22/2016 1246   [place] 08/22/2016 1246   PROTEINUR NEGATIVE 08/22/2016 1246   UROBILINOGEN 0.2 08/12/2016 1720   NITRITE NEGATIVE 08/22/2016 1246   LEUKOCYTESUR NEGATIVE 08/22/2016 1246   Sepsis  Labs: @LABRCNTIP (procalcitonin:4,lacticidven:4)   )No results found for this or any previous visit (from the past 240 hour(s)).    Radiology Studies: Dg Chest 2 View Result Date: 08/22/2016 No active cardiopulmonary disease.   Ct Head Wo Contrast 1. Mild generalized atrophy and white matter disease likely reflects the sequela of chronic microvascular ischemia. 2. No acute intracranial abnormality. 3. Atherosclerosis. 4. Right maxillary sinus disease.   Ct Soft Tissue Neck W Contrast Result Date: 08/22/2016 1. Extensive bilateral  cervical adenopathy concerning metastatic disease. Lymphoproliferative disease is considered less likely. 2. Mild fullness in the tonsillar area bilaterally may reflect prior treatment versus recurrent disease. 3. Bilateral atherosclerosis. 4. Right maxillary sinus disease.   Mr Jeri Cos And Wo Contrast Result Date: 1/14/2018Scan is of marginal diagnostic utility, from the standpoint of patient motion as well as some type of susceptibility artifact which obscures much of the lower slices. No visible restricted diffusion. No definite abnormal postcontrast enhancement. Atrophy and small vessel disease.    Scheduled Meds: . amLODipine  5 mg Oral Daily  . aspirin EC  81 mg Oral Daily  . atorvastatin  20 mg Oral q1800  . DULoxetine  60 mg Oral Daily  . feeding supplement (ENSURE ENLIVE)  237 mL Oral BID BM  . heparin  5,000 Units Subcutaneous Q8H  . montelukast  10 mg Oral Q breakfast   Continuous Infusions:    LOS: 1 day    Time spent: 15 minutes  Greater than 50% of the time spent on counseling and coordinating the care.   Leisa Lenz, MD [facility] Pager [phone]  If 7PM-7AM, please contact night-coverage [email or web address] Password [email or web address] 08/24/2016, 1:12 PM

## 2016-08-24 NOTE — Telephone Encounter (Signed)
Patients husband called and wanted me to let you know that Dr Jenny Reichmann changed patients medication. It caused her to be dizzy and weak in the legs. She fell over the weekend and is still in the hospital. He has cancelled her xray for tomorrow but wanted you to be aware of whats going on. Thanks.

## 2016-08-24 NOTE — Evaluation (Signed)
Occupational Therapy Evaluation Patient Details Name: Karn Barona MRN: [id number] DOB: 1945/12/12 Today's Date: 08/24/2016    History of Present Illness 71 y.o. female with PMH significant for significant for obesity, HTN, HLD, depression, anxiety, chronic pain from OA and asthma; who presented to ED secondary to increase somnolence, AMS and ataxia; admitted for altered mental state: unclear etiology duspecting related to misuse of her medications (especially oxycodone and klonopin).    Clinical Impression   Pt admitted as above, with current decreased ability to perform ADL's and self care tasks secondary to decreased cognition and AMS (see OT problem list below). She should benefit from acute OT to assist in maximizing independence in these areas for anticipated return home with 24/7 supervision/assist and recommended HHOT.    Follow Up Recommendations  Home health OT;Supervision/Assistance - 24 hour    Equipment Recommendations  None recommended by OT;Other (comment) (Cont to assess in functional context)    Recommendations for Other Services       Precautions / Restrictions Precautions Precautions: Fall Restrictions Weight Bearing Restrictions: No      Mobility Bed Mobility Overal bed mobility: Needs Assistance Bed Mobility: Supine to Sit     Supine to sit: Supervision     General bed mobility comments: cues required due to decreased cognition  Transfers Overall transfer level: Needs assistance Equipment used: Rolling walker (2 wheeled) Transfers: Sit to/from [facility] Sit to Stand: Min guard Stand pivot transfers: Min guard       General transfer comment: cues required due to decreased cognition    Balance Overall balance assessment: History of Falls (Falls started with decreased cognition per PT chart/review)                                          ADL Overall ADL's : Needs assistance/impaired     Grooming:  Wash/dry hands;Set up;Sitting   Upper Body Bathing: Set up;Sitting (Simulated) Upper Body Bathing Details (indicate cue type and reason): verbal cues secondary to confusion Lower Body Bathing: Minimal assistance;Sit to/from stand Lower Body Bathing Details (indicate cue type and reason): Pt unsteady on her feet. Confused, Min A for safety Upper Body Dressing : Set up;Sitting   Lower Body Dressing: Minimal assistance;Sit to/from stand Lower Body Dressing Details (indicate cue type and reason): Pt currently requires Min A Toilet Transfer: Min guard;Ambulation;RW;Grab Information systems manager Details (indicate cue type and reason): VC's and Min guard for safety secondary to confusion and decreased balance Toileting- Clothing Manipulation and Hygiene: Min guard;Sit to/from stand;Cueing for safety       Functional mobility during ADLs: Min guard;Cueing for safety;Cueing for sequencing;Rolling walker General ADL Comments: Pt confusion and decreased balance appears to be affecting pt ability to perform ADL's and self care tasks. Pt unable to state date, month or day of the week. Increased time to recall where she is from [address]" and unable to state what she used to do for work or what her husband does. Only that he "Works, I don't know what". Difficulty following commands during ADL retraining session today noted. Pt should benefit from acute OT followed by 27/7 supervision PRN A w/ HHOT.     Vision  No change from baseline. Wears glasses for reading.   Perception     Praxis      Pertinent Vitals/Pain Pain Assessment: No/denies pain     Hand Dominance Left  Extremity/Trunk Assessment Upper Extremity Assessment Upper Extremity Assessment: Overall WFL for tasks assessed;Generalized weakness   Lower Extremity Assessment Lower Extremity Assessment: Defer to PT evaluation;Generalized weakness   Cervical / Trunk Assessment Cervical / Trunk Assessment: Normal    Communication Communication Communication: No difficulties   Cognition Arousal/Alertness: Awake/alert Behavior During Therapy: WFL for tasks assessed/performed Overall Cognitive Status: Impaired/Different from baseline Area of Impairment: Safety/judgement;Problem solving         Safety/Judgement: Decreased awareness of safety;Decreased awareness of deficits   Problem Solving: Slow processing;Requires verbal cues     General Comments   Confused, difficulty following commands, answering basic questions.    Exercises       Shoulder Instructions      Home Living Family/patient expects to be discharged to:: Private residence Living Arrangements: Spouse/significant other Available Help at Discharge: Family Type of Home: Apartment Home Access: Stairs to enter Technical brewer of Steps: flight Entrance Stairs-Rails: Right Home Layout: One level     Bathroom Shower/Tub: Occupational psychologist: Standard     Home Equipment: None          Prior Functioning/Environment Level of Independence: Independent                 OT Problem List: Decreased knowledge of use of DME or AE;Decreased knowledge of precautions;Decreased activity tolerance;Decreased cognition;Decreased safety awareness;Other (comment) (H/O falls with decreased cognition per chart review)   OT Treatment/Interventions: Self-care/ADL training;Therapeutic exercise;Patient/family education;Therapeutic activities;DME and/or AE instruction;Cognitive remediation/compensation    OT Goals(Current goals can be found in the care plan section) Acute Rehab OT Goals Patient Stated Goal: "I just want to go home, get out of here" OT Goal Formulation: Patient unable to participate in goal setting Time For Goal Achievement: 09/07/16 Potential to Achieve Goals: Good  OT Frequency: Min 2X/week   Barriers to D/C:            Co-evaluation              End of Session Equipment Utilized During  Treatment: Gait belt;Rolling walker  Activity Tolerance: Patient tolerated treatment well Patient left: in chair;with call bell/phone within reach;with chair alarm set   Time: [id number] OT Time Calculation (min): 20 min Charges:  OT General Charges $OT Visit: 1 Procedure OT Evaluation $OT Eval Low Complexity: 1 Procedure OT Treatments $Self Care/Home Management : 8-22 mins G-Codes:    Almyra Deforest, OTR/L 08/24/2016, 8:37 AM

## 2016-08-24 NOTE — Progress Notes (Signed)
Initial Nutrition Assessment  DOCUMENTATION CODES:   Not applicable  INTERVENTION:   Ensure Enlive po BID, each supplement provides 350 kcal and 20 grams of protein  NUTRITION DIAGNOSIS:   Inadequate oral intake related to acute illness as evidenced by moderate depletions of muscle mass, meal completion < 25%.  GOAL:   Patient will meet greater than or equal to 90% of their needs  MONITOR:   PO intake, Supplement acceptance, Labs, Weight trends  REASON FOR ASSESSMENT:   Malnutrition Screening Tool    ASSESSMENT:   71 y.o. female with past medical history significant for hypertension, dyslipidemia, depression, chronic pain from osteoarthritis and asthma who presented to ED with gait imbalance and falls, increased somnolence.  Found to have neck mass   Met with pt in room today. Pt reports that she is not eating much r/t depression. Pt is drinking 100% Ensures and eating 20% meals. Per chart, pt is weight stable. Talked to pt about the importance of adequate protein and energy intake in the hospital. Continue to encourage meals and supplements.   Medications reviewed and include: aspirin, heparin  Labs reviewed: Na 134(L), K 3.3(L), CL 100(L) WBC- 11.8(H)  Nutrition-Focused physical exam completed. Findings are no fat depletion, moderate muscle depletion, and mild edema.   Diet Order:  DIET SOFT Room service appropriate? Yes; Fluid consistency: Thin  Skin:  Reviewed, no issues  Last BM:  1/15  Height:   Ht Readings from Last 1 Encounters:  08/22/16 5' 9" (1.753 m)    Weight:   Wt Readings from Last 1 Encounters:  08/22/16 237 lb (107.5 kg)    Ideal Body Weight:     BMI:  Body mass index is 35 kg/m.  Estimated Nutritional Needs:   Kcal:  1650-1950kcal/day   Protein:  118-130g/day   Fluid:  >1.6L/day   EDUCATION NEEDS:   No education needs identified at this time  Koleen Distance, RD, LDN Pager #[phone]

## 2016-08-24 NOTE — Telephone Encounter (Signed)
I have reviewed the chart  Pt condition on admission was felt likely due to dehdyration and weakness, possibly related to her malignancy.  I suspect the klonopin 0.5 mg did not help, but this was not likely the main cause of her condition.  This medication should be stopped, and appropriate tx for the dehydration and malignancy will be pursued I am sure by the hospitalist team.

## 2016-08-24 NOTE — Progress Notes (Signed)
Pt declined cpap tonight.  Pt was advised that this RT is available all night and encouraged her to call, should she change her mind.

## 2016-08-25 ENCOUNTER — Inpatient Hospital Stay: Admission: RE | Admit: 2016-08-25 | Payer: Medicare Other | Source: Ambulatory Visit

## 2016-08-25 ENCOUNTER — Encounter: Payer: Self-pay | Admitting: *Deleted

## 2016-08-25 DIAGNOSIS — I10 Essential (primary) hypertension: Secondary | ICD-10-CM

## 2016-08-25 DIAGNOSIS — N183 Chronic kidney disease, stage 3 (moderate): Secondary | ICD-10-CM

## 2016-08-25 DIAGNOSIS — E860 Dehydration: Secondary | ICD-10-CM

## 2016-08-25 DIAGNOSIS — R404 Transient alteration of awareness: Secondary | ICD-10-CM

## 2016-08-25 LAB — BASIC METABOLIC PANEL
Anion gap: 8 (ref 5–15)
BUN: 17 mg/dL (ref 6–20)
CALCIUM: 10.3 mg/dL (ref 8.9–10.3)
CO2: 26 mmol/L (ref 22–32)
Chloride: 102 mmol/L (ref 101–111)
Creatinine, Ser: 0.92 mg/dL (ref 0.44–1.00)
GFR calc Af Amer: >60 mL/min (ref >60)
Glucose, Bld: 108 mg/dL — ABNORMAL HIGH (ref 65–99)
Potassium: 3.6 mmol/L (ref 3.5–5.1)
Sodium: 136 mmol/L (ref 135–145)

## 2016-08-25 LAB — CBC
HCT: 32.4 % — ABNORMAL LOW (ref 36.0–46.0)
Hemoglobin: 10.6 g/dL — ABNORMAL LOW (ref 12.0–15.0)
MCH: 28.5 pg (ref 26.0–34.0)
MCHC: 32.7 g/dL (ref 30.0–36.0)
MCV: 87.1 fL (ref 78.0–100.0)
Platelets: 347 10*3/uL (ref 150–400)
RBC: 3.72 MIL/uL — ABNORMAL LOW (ref 3.87–5.11)
RDW: 15.9 % — AB (ref 11.5–15.5)
WBC: 14.1 10*3/uL — ABNORMAL HIGH (ref 4.0–10.5)

## 2016-08-25 MED ORDER — AMLODIPINE BESYLATE 5 MG PO TABS: 5.0000 mg | ORAL_TABLET | Freq: Every day | ORAL | 0 refills | Status: AC

## 2016-08-25 NOTE — Progress Notes (Signed)
Occupational Therapy Treatment Patient Details Name: Desiree Higgins MRN: [id number] DOB: 12/15/1945 Today's Date: 08/25/2016    History of present illness 71 y.o. female with PMH significant for significant for obesity, HTN, HLD, depression, anxiety, chronic pain from OA and asthma; who presented to ED secondary to increase somnolence, AMS and ataxia; admitted for altered mental state: unclear etiology duspecting related to misuse of her medications (especially oxycodone and klonopin).    OT comments  Pt seen for ADL retraining session today w/ focus on grooming standing at sink, toileting transfers and functional mobility in hallway following ADL's. She cont to benefit from close supervision - Min guard assistance for ADL safety secondary to confusion and decreased balance. Req step by step instruction for basic ADL's to remain on task.   Follow Up Recommendations  Home health OT;Supervision/Assistance - 24 hour    Equipment Recommendations  None recommended by OT;Other (comment) (Cont to assess in functional context)    Recommendations for Other Services      Precautions / Restrictions Precautions Precautions: Fall Restrictions Weight Bearing Restrictions: No       Mobility Bed Mobility Overal bed mobility: Needs Assistance Bed Mobility: Supine to Sit     Supine to sit: Supervision        Transfers Overall transfer level: Needs assistance Equipment used: Rolling walker (2 wheeled) Transfers: Sit to/from [employer] Transfers Sit to Stand: Supervision (Close supervision) Stand pivot transfers: Supervision;Min guard       General transfer comment: cues required due to decreased cognition    Balance Overall balance assessment: History of Falls                                 ADL Overall ADL's : Needs assistance/impaired Eating/Feeding: Modified independent;Sitting   Grooming: Wash/dry hands;Wash/dry face;Oral  care;Supervision/safety;Standing Grooming Details (indicate cue type and reason): Grooming standing at sink in pt room             Lower Body Dressing: Minimal assistance;Sit to/from stand   Toilet Transfer: Supervision/safety;RW;Ambulation;Cueing for safety;Cueing for sequencing;Comfort height toilet Toilet Transfer Details (indicate cue type and reason): VC's and close supervision for safety secondary to confusion and decreased balance Toileting- Clothing Manipulation and Hygiene: Supervision/safety;Sitting/lateral lean;Sit to/from stand Toileting - Clothing Manipulation Details (indicate cue type and reason): Close supervision (see above)     Functional mobility during ADLs: Supervision/safety;Cueing for safety;Cueing for sequencing;Rolling walker General ADL Comments: Pt seen for functional mobility and ADL retraining session today. She cont to benefit from close supervision - Min guard assistance for ADL's secondary to confusion and decreased balance. Req step by step instruction for basic ADL's to remain on task noted. Functional mobility in hallway after completetion of grooming and toileting.      Vision  No changes from baseline per pt report.                   Perception     Praxis      Cognition   Behavior During Therapy: WFL for tasks assessed/performed Overall Cognitive Status: Impaired/Different from baseline Area of Impairment: Safety/judgement;Problem solving          Safety/Judgement: Decreased awareness of safety;Decreased awareness of deficits   Problem Solving: Slow processing;Requires verbal cues;Difficulty sequencing      Extremity/Trunk Assessment               Exercises     Shoulder Instructions  General Comments      Pertinent Vitals/ Pain       Pain Assessment: No/denies pain  Home Living                                          Prior Functioning/Environment              Frequency  Min  2X/week        Progress Toward Goals  OT Goals(current goals can now be found in the care plan section)  Progress towards OT goals: Progressing toward goals  Acute Rehab OT Goals Patient Stated Goal: "I just want to get out of here"  Plan Discharge plan remains appropriate    Co-evaluation                 End of Session Equipment Utilized During Treatment: Gait belt;Rolling walker   Activity Tolerance Patient tolerated treatment well   Patient Left in chair;with call bell/phone within reach;with chair alarm set   Nurse Communication Mobility status;Other (comment) (Pt sitting up in chair; basic grooming and toileting complete)        Time: [id number] OT Time Calculation (min): 32 min  Charges: OT General Charges $OT Visit: 1 Procedure OT Treatments $Self Care/Home Management : 23-37 mins  Shiraz Bastyr Beth Dixon, OTR/L 08/25/2016, 8:41 AM

## 2016-08-25 NOTE — Discharge Summary (Signed)
Physician Discharge Summary  Desiree Higgins [id number] DOB: 05-18-1946 DOA: 08/22/2016  PCP: Desiree Cower, MD  Admit date: 08/22/2016 Discharge date: 08/25/2016  Admitted From: Home Disposition:  Home  Recommendations for Outpatient Follow-up:  1. Follow up with PCP in 2-3 weeks 2. Follow up with Dr. Lucia Higgins as scheduled 3. Please follow up on PET scan, refer for outpatient IR guided neck mass biopsy after PET scan results are obtained  Home Health:RN, OT, PT, aide     Discharge Condition:Stable CODE STATUS:Full Diet recommendation: Soft, as tolerated   Brief/Interim Summary: 71 y.o.femalewith past medical history significant for hypertension, dyslipidemia, depression, chronic pain from osteoarthritis and asthma who presented to ED with gait imbalance and falls, increased somnolence. The symptoms started about 3 weeks prior to this presentation and have progressed since. Per pt husband, patient is confused, forgetful and unable to remember simple tasks for ex., if she took her meds or not. On admission, pt was hemodynamically stable. CT neck showed extensive bilateral cervical adenopathy concerning metastatic disease. Lmphoproliferative disease is considered less likely. Mild fullness in the tonsillar area bilaterally may reflect prior treatment versus recurrent disease. CT head showed no acute intracranial findings. MRI brain was not of a good study due to motion artifact.     Acute metabolic encephalopathy - CT head showed no acute intracranial findings. MRI brain was not of a good study due to motion artifact.  -Pt has been evaluated by PT with recommendations for home health PT  Right side neck mass - CT neck showed extensive bilateral cervical adenopathy concerning metastatic disease. Lmphoproliferative disease is considered less likely. Mild fullness in the tonsillar area bilaterally may reflect prior treatment versus recurrent disease. - Sent message to Dr. Melony Higgins that  pt was admitted - Placed order for bx of right neck mass but per IR prefer that pt first undergoes PET scan which can be done on outpt basis only  Depression - Continued Cymbalta  CKD (chronic kidney disease) stage 3, GFR 30-59 ml/min - Cr 1.43 on admission - Cr normalized with IV fluids   Anemia of chronic disease - Due to CKD - Hemoglobin stable at 9.8, 10.1  Essential hypertension - Losartan-hctz was initially placed on hold due to CKD - Started Norvasc 5 mg daily   Hypokalemia - Supplemented   Dyslipidemia - Continued statin therapy   Discharge Diagnoses:  Principal Problem:   Altered mental state Active Problems:   Hyperlipidemia   Essential hypertension   Mass of right side of neck   Hypersomnolence   CKD (chronic kidney disease) stage 3, GFR 30-59 ml/min   Dehydration   Altered awareness, transient    Discharge Instructions   Allergies as of 08/25/2016      Reactions   Ace Inhibitors    REACTION: cough      Medication List    STOP taking these medications   amoxicillin 875 MG tablet Commonly known as:  AMOXIL   cholestyramine light 4 g packet Commonly known as:  PREVALITE   clonazePAM 0.5 MG tablet Commonly known as:  KLONOPIN   predniSONE 10 MG tablet Commonly known as:  DELTASONE     TAKE these medications   amLODipine 5 MG tablet Commonly known as:  NORVASC Take 1 tablet (5 mg total) by mouth daily. Start taking on:  08/26/2016   aspirin 81 MG EC tablet Take 81 mg by mouth daily. Swallow whole.   DULoxetine 60 MG capsule Commonly known as:  CYMBALTA Take 1 capsule (60  mg total) by mouth daily.   losartan-hydrochlorothiazide 100-25 MG tablet Commonly known as:  HYZAAR Take 1 tablet by mouth daily.   montelukast 10 MG tablet Commonly known as:  SINGULAIR Take 1 tablet (10 mg total) by mouth daily with breakfast.   oxyCODONE-acetaminophen 10-325 MG tablet Commonly known as:  PERCOCET Take 1 tablet by mouth every 4 (four)  hours as needed.   PROAIR HFA 108 (90 Base) MCG/ACT inhaler Generic drug:  albuterol INHALE TWO PUFFS INTO LUNGS EVERY 6 HOURS AS NEEDED FOR WHEEZING AND FOR SHORTNESS OF BREATH   simvastatin 40 MG tablet Commonly known as:  ZOCOR Take 1 tablet (40 mg total) by mouth daily.   triamcinolone 55 MCG/ACT nasal inhaler Commonly known as:  NASACORT Place 2 sprays into the nose daily.            Durable Medical Equipment        Start     Ordered   08/24/16 1147  For home use only DME Walker rolling  Once    Comments:  Multiple falls  Question:  Patient needs a walker to treat with the following condition  Answer:  Altered mental status   08/24/16 1146   08/24/16 1146  For home use only DME 3 n 1  Once     08/24/16 1146     Follow-up Information    Desiree Higgins Follow up.   Why:  home health physical and occupational therapy, nurse, and aide and 3n1 (over the commode seat or bedside commode) Contact information: [address] [place] [address] [phone]        Desiree Cower, MD. Schedule an appointment as soon as possible for a visit in 2 week(s).   Specialties:  Internal Medicine, Radiology Contact information: [place] [address] [phone]        Desiree Overly, MD. Schedule an appointment as soon as possible for a visit in 2 week(s).   Specialty:  Otolaryngology Contact information: [address] [place] [address] [phone]          Allergies  Allergen Reactions  . Ace Inhibitors     REACTION: cough    Consultations:  IR  Procedures/Studies: Dg Chest 2 View  Result Date: 08/22/2016 CLINICAL DATA:  AMS and weakness of Bilateral legs per husband x about 2 weeks.Husband stated to me he believes pt has been taking too many of her prescriptions than what is prescribed.Asthma, HTN, some day smokerSx: breast biopsy -1972 EXAM: CHEST  2 VIEW COMPARISON:  08/12/2016 FINDINGS:  Cardiac silhouette is mildly enlarged. No mediastinal or hilar masses. No convincing adenopathy. There are prominent bronchovascular markings bilaterally, stable. No evidence of pneumonia or pulmonary edema. No pleural effusion or pneumothorax. Skeletal structures are intact. IMPRESSION: No active cardiopulmonary disease. Electronically Signed   By: Lajean Manes M.D.   On: 08/22/2016 13:39   Dg Chest 2 View  Result Date: 08/13/2016 CLINICAL DATA:  Hypertension, smoker, weight loss EXAM: CHEST  2 VIEW COMPARISON:  None available FINDINGS: The lungs are hyperinflated compatible with background COPD/ emphysema. No focal pneumonia, collapse or consolidation. Negative for edema, effusion or pneumothorax. Trachea is midline. Atherosclerosis noted of the aorta. Degenerative changes of the spine diffusely. No acute compression fracture. IMPRESSION: Hyperinflation compatible with COPD/emphysema. Thoracic aortic atherosclerosis No superimposed acute process Electronically Signed   By: Jerilynn Mages.  Shick M.D.   On: 08/13/2016 08:24   Dg Thoracic Spine 2 View  Result  Date: 08/13/2016 CLINICAL DATA:  Multiple falls over the past 6 months with persistent back pain, initial encounter EXAM: THORACIC SPINE 2 VIEWS COMPARISON:  None. FINDINGS: Vertebral body height is well maintained. No pedicle abnormality or paraspinal mass lesion is seen. Mild osteophytic changes are noted. The lungs are clear as visualized. IMPRESSION: Mild degenerative change without acute abnormality. Electronically Signed   By: Inez Catalina M.D.   On: 08/13/2016 08:27   Dg Lumbar Spine Complete  Result Date: 08/13/2016 CLINICAL DATA:  Multiple falls over the past 6 months with chronic back pain, initial encounter EXAM: LUMBAR SPINE - COMPLETE 4+ VIEW COMPARISON:  04/26/2015 FINDINGS: Five lumbar type vertebral bodies are well visualized. Vertebral body height is well maintained with the exception of a chronic appearing L2 compression deformity similar to that  noted on the prior exam. Osteophytic changes are noted at L2-3 and L1-2 and to a lesser degree at L3-4. No pars defects are noted. No acute abnormality is seen. No soft tissue changes are noted. IMPRESSION: Chronic L2 compression deformity. Multifocal degenerative change. Electronically Signed   By: Inez Catalina M.D.   On: 08/13/2016 08:26   Ct Head Wo Contrast  Result Date: 08/22/2016 CLINICAL DATA:  Three day history of weakness. EXAM: CT HEAD WITHOUT CONTRAST TECHNIQUE: Contiguous axial images were obtained from the base of the skull through the vertex without intravenous contrast. COMPARISON:  None. FINDINGS: Brain: Study is mildly degraded by patient motion. No acute infarct, hemorrhage, or mass lesion is present. Mild generalized atrophy and white matter disease is present. The ventricles are proportionate to the degree of atrophy. No significant extra-axial fluid collection is present. Vascular: Atherosclerotic calcifications are present in the cavernous internal carotid arteries and at the dural margin of the vertebral arteries. No hyperdense vessel is present. Skull: The calvarium is intact. No significant extracranial soft tissue lesions are present. Sinuses/Orbits: A polyp or mucous retention cyst is present in the right maxillary sinus with associated mucosal thickening. The paranasal sinuses and mastoid air cells are otherwise clear. IMPRESSION: 1. Mild generalized atrophy and white matter disease likely reflects the sequela of chronic microvascular ischemia. 2. No acute intracranial abnormality. 3. Atherosclerosis. 4. Right maxillary sinus disease. Electronically Signed   By: San Morelle M.D.   On: 08/22/2016 15:33   Ct Soft Tissue Neck W Contrast  Result Date: 08/22/2016 CLINICAL DATA:  Neck pain.  History of tonsil cancer. EXAM: CT NECK WITH CONTRAST TECHNIQUE: Multidetector CT imaging of the neck was performed using the standard protocol following the bolus administration of  intravenous contrast. CONTRAST:  40mL ISOVUE-300 IOPAMIDOL (ISOVUE-300) INJECTION 61% COMPARISON:  None. FINDINGS: Pharynx and larynx: No focal mucosal or submucosal lesions are present. The nasopharynx, oropharynx, and hypopharynx are clear. There is some fullness at the level of the tonsils, left greater than right. Vocal cords are midline and symmetric. Salivary glands: Enhancing nodules are present at the inferior aspect of the parotid glands bilaterally. The upper parotid glands are within normal limits. The submandibular glands are normal bilaterally. Thyroid: Negative Lymph nodes: A right lateral peripherally enhancing nodal mass that measures 2.2 x 1.8 x 2.5 cm. Just inferior and anterior is a 1.5 cm peripherally enhancing lesion. The areas of the enhancement extending to the right parotid gland. There are several smaller rounded right level 2 lymph nodes. The largest measures 13 mm. A peripherally enhancing right level 3 lymph node posteriorly measures 10 mm. A rounded right level 3 lymph node measures 10 mm. Smaller posterior  right level 3 and level 4 lymph nodes are present. Multiple suspicious left-sided lymph nodes are present as well. A peripherally enhancing node or 2 nodes are present along the medial aspect of the inferior left parotid. A rounded left level 2 lymph node measures 12 mm. An ovoid posterior left level 3 lymph node measures 15 mm. A left level 4 lymph node measures 12 mm. Right peripherally enhancing left supraclavicular node measures 2.01.7 cm on image 75 of series 6. There are 2 posterior triangle lymph nodes on the left measuring 15 and 19 mm respectively. Vascular: Atherosclerotic calcifications are present at the carotid bifurcations bilaterally. There is potential stenosis bilaterally. Limited intracranial: Limited imaging the brain is unremarkable. Atherosclerotic calcifications are present. Visualized orbits: A right lens replacement is present. The globes and orbits are  otherwise within normal limits. Mastoids and visualized paranasal sinuses: A large polyp or mucous retention cyst is noted inferiorly in the right maxillary sinus. There is some mucosal thickening associated. The paranasal sinuses and mastoid air cells are otherwise clear. Skeleton: Degenerative endplate changes are most severe at C5-6 and C6-7. Uncovertebral spurring results in right greater than left foraminal narrowing. Left greater than right foraminal narrowing is present at C4-5 due to facet spurring. Upper chest: Dependent atelectasis is present at the lung apices bilaterally. Atherosclerotic calcifications are present at the arch. The upper mediastinum is otherwise within normal limits. IMPRESSION: 1. Extensive bilateral cervical adenopathy concerning metastatic disease. Lymphoproliferative disease is considered less likely. 2. Mild fullness in the tonsillar area bilaterally may reflect prior treatment versus recurrent disease. 3. Bilateral atherosclerosis. 4. Right maxillary sinus disease. Electronically Signed   By: San Morelle M.D.   On: 08/22/2016 15:28   Mr Brain W And Wo Contrast  Result Date: 08/22/2016 CLINICAL DATA:  Altered mental status, recent falls. General weakness. EXAM: MRI HEAD WITHOUT AND WITH CONTRAST TECHNIQUE: Multiplanar, multiecho pulse sequences of the brain and surrounding structures were obtained without and with intravenous contrast. CONTRAST:  47mL MULTIHANCE GADOBENATE DIMEGLUMINE 529 MG/ML IV SOLN COMPARISON:  CT neck earlier today. FINDINGS: Significant motion degradation. Study is of marginal diagnostic utility. Some type of susceptibility artifact at the skull bases, leads to artifactual distortion of much of the scan. Brain: No evidence for acute infarction, hemorrhage, mass lesion, hydrocephalus, or extra-axial fluid. Global atrophy. Chronic microvascular ischemic change. Post infusion, no abnormal enhancement of the brain or meninges. Vascular: Normal flow  voids. Skull and upper cervical spine: Normal marrow signal. Sinuses/Orbits: Near complete opacification RIGHT maxillary sinus due to a polyp or retention cyst. No layering fluid. Other: Metastatic adenopathy better visualized on recent CT. Chronic RIGHT mastoid fluid. IMPRESSION: Scan is of marginal diagnostic utility, from the standpoint of patient motion as well as some type of susceptibility artifact which obscures much of the lower slices. No visible restricted diffusion. No definite abnormal postcontrast enhancement. Atrophy and small vessel disease. Electronically Signed   By: Staci Righter M.D.   On: 08/22/2016 18:27    Subjective: Eager to go home  Discharge Exam: Vitals:   08/25/16 0548 08/25/16 1315  BP: (!) 138/57 (!) 164/71  Pulse: 77 86  Resp: 18 18  Temp: 98.3 F (36.8 C) 98.2 F (36.8 C)   Vitals:   08/24/16 1430 08/24/16 2222 08/25/16 0548 08/25/16 1315  BP: 127/63 128/71 (!) 138/57 (!) 164/71  Pulse: 84 83 77 86  Resp: 18 18 18 18   Temp: 97.6 F (36.4 C) 98.2 F (36.8 C) 98.3 F (36.8 C) 98.2  F (36.8 C)  TempSrc: Oral Oral Oral Oral  SpO2: 97% 95% 97% 100%  Weight:      Height:        General: Pt is alert, awake, not in acute distress Cardiovascular: RRR, S1/S2 +, no rubs, no gallops Respiratory: CTA bilaterally, no wheezing, no rhonchi Abdominal: Soft, NT, ND, bowel sounds + Extremities: no edema, no cyanosis   The results of significant diagnostics from this hospitalization (including imaging, microbiology, ancillary and laboratory) are listed below for reference.     Microbiology: No results found for this or any previous visit (from the past 240 hour(s)).   Labs: BNP (last 3 results) No results for input(s): BNP in the last 8760 hours. Basic Metabolic Panel:  Recent Labs Lab 08/22/16 1324 08/22/16 2212 08/23/16 0419 08/24/16 0438 08/25/16 0422  NA 135  --  135 134* 136  K 3.5  --  3.5 3.3* 3.6  CL 100*  --  101 100* 102  CO2 27  --   27 26 26   GLUCOSE 103*  --  99 104* 108*  BUN 33*  --  27* 20 17  CREATININE 1.43*  --  1.14* 0.86 0.92  CALCIUM 10.4*  --  10.2 10.1 10.3  MG  --  1.8  --   --   --   PHOS  --  2.6  --   --   --    Liver Function Tests:  Recent Labs Lab 08/22/16 1324  AST 16  ALT 16  ALKPHOS 84  BILITOT 0.5  PROT 7.7  ALBUMIN 2.9*   No results for input(s): LIPASE, AMYLASE in the last 168 hours.  Recent Labs Lab 08/23/16 0003  AMMONIA 31   CBC:  Recent Labs Lab 08/22/16 1324 08/23/16 0419 08/24/16 0438 08/25/16 0422  WBC 13.1* 12.8* 11.8* 14.1*  NEUTROABS 8.8*  --   --   --   HGB 10.5* 9.8* 10.1* 10.6*  HCT 32.6* 29.9* 30.7* 32.4*  MCV 89.6 89.3 89.0 87.1  PLT 344 325 312 347   Cardiac Enzymes:  Recent Labs Lab 08/22/16 1324 08/22/16 2333  CKTOTAL  --  37*  CKMB  --  2.7  TROPONINI <0.03  --    BNP: Invalid input(s): POCBNP CBG: No results for input(s): GLUCAP in the last 168 hours. D-Dimer No results for input(s): DDIMER in the last 72 hours. Hgb A1c No results for input(s): HGBA1C in the last 72 hours. Lipid Profile No results for input(s): CHOL, HDL, LDLCALC, TRIG, CHOLHDL, LDLDIRECT in the last 72 hours. Thyroid function studies  Recent Labs  08/22/16 2212  TSH 0.258*   Anemia work up  Recent Labs  08/22/16 2212  VITAMINB12 441   Urinalysis    Component Value Date/Time   COLORURINE YELLOW 08/22/2016 [place] 08/22/2016 1246   LABSPEC 1.016 08/22/2016 1246   PHURINE 5.0 08/22/2016 [place] 08/22/2016 [place] 08/12/2016 [place] 08/22/2016 [place] 08/22/2016 [place] 08/22/2016 1246   PROTEINUR NEGATIVE 08/22/2016 1246   UROBILINOGEN 0.2 08/12/2016 1720   NITRITE NEGATIVE 08/22/2016 1246   LEUKOCYTESUR NEGATIVE 08/22/2016 1246   Sepsis Labs Invalid input(s): PROCALCITONIN,  WBC,  LACTICIDVEN Microbiology No results found for this or any  previous visit (from the past 240 hour(s)).   SIGNED:   Donne Hazel, MD  [facility] 08/25/2016, 4:19 PM  If 7PM-7AM, please contact night-coverage [email or web address]  Password [email or web address]

## 2016-08-25 NOTE — Progress Notes (Addendum)
Oncology Nurse Navigator Documentation  Visited Desiree Higgins 1611.   Introduced myself as the H&N oncology nurse navigator that works with Dr. Isidore Moos to whom she has been referred by Dr. [name].  She confirmed her understanding of referral.  I briefly explained my role as her navigator, indicated that I would be joining her when she meets with Dr. Isidore Moos during an appt to be scheduled.  She confirmed she has upper dentures, no teeth lower jaw except for front incisors.  I provided my contact information, encouraged her to call with questions/concerns.   She expressed appreciation for my visit.   Gayleen Orem, RN, BSN, [place] [facility] Oncology Nurse [place] [facility] [place] [phone]

## 2016-08-25 NOTE — Progress Notes (Signed)
Dr. Wyline Copas paged as pt has voided but not able to be measured as urine went into the toilet versus the measuring hat. Bladder scanned pt and found to have 151ml in bladder. Dr Wyline Copas aware of situation and stated that he is okay with pt being discharged to home. PTAR notified as they are the transporting system getting pt home as husband is at home and unable to provided transportation for her. Husband aware of situation and will be notified when pt is leaving facility as he will be there to help her getting into the house. Charge nurse aware of situation as well. Pt also updated on plan of care.

## 2016-08-25 NOTE — Progress Notes (Signed)
Physical Therapy Treatment Patient Details Name: Desiree Higgins MRN: [id number] DOB: 07-08-1946 Today's Date: 30-Aug-2016    History of Present Illness 71 y.o. female with PMH significant for significant for obesity, HTN, HLD, depression, anxiety, chronic pain from OA and asthma; who presented to ED secondary to increase somnolence, AMS and ataxia; admitted for altered mental state: unclear etiology duspecting related to misuse of her medications (especially oxycodone and klonopin).     PT Comments    Pt assisted with ambulating in hallway.    Follow Up Recommendations  Supervision/Assistance - 24 hour;Home health PT     Equipment Recommendations  Rolling walker with 5" wheels    Recommendations for Other Services       Precautions / Restrictions Precautions Precautions: Fall    Mobility  Bed Mobility   Bed Mobility: Supine to Sit;Sit to Supine     Supine to sit: Supervision Sit to supine: Supervision   General bed mobility comments: supervision for safety  Transfers Overall transfer level: Needs assistance Equipment used: Rolling walker (2 wheeled) Transfers: Sit to/from Stand Sit to Stand: Supervision         General transfer comment: cues required due to decreased cognition, supervision for safety  Ambulation/Gait Ambulation/Gait assistance: Min guard;Min assist Ambulation Distance (Feet): 120 Feet Assistive device: Rolling walker (2 wheeled) Gait Pattern/deviations: Step-through pattern;Decreased stride length;Drifts right/left     General Gait Details: utilized RW today, pt fatigued quickly, steady with bil UE support however occasional assist for navigating/manuevering RW   Stairs            Wheelchair Mobility    Modified Rankin (Stroke Patients Only)       Balance                                    Cognition Arousal/Alertness: Awake/alert Behavior During Therapy: WFL for tasks assessed/performed Overall Cognitive Status:  Impaired/Different from baseline Area of Impairment: Safety/judgement;Problem solving         Safety/Judgement: Decreased awareness of safety;Decreased awareness of deficits   Problem Solving: Slow processing;Requires verbal cues General Comments: cognition appears improved sicne previous session however still not baseline    Exercises      General Comments        Pertinent Vitals/Pain Pain Assessment: No/denies pain    Home Living                      Prior Function            PT Goals (current goals can now be found in the care plan section) Acute Rehab PT Goals Patient Stated Goal: "I just want to get out of here" Progress towards PT goals: Progressing toward goals    Frequency    Min 3X/week      PT Plan Current plan remains appropriate    Co-evaluation             End of Session Equipment Utilized During Treatment: Gait belt Activity Tolerance: Patient tolerated treatment well Patient left: with bed alarm set;in bed;with call bell/phone within reach     Time: 1040-1053 PT Time Calculation (min) (ACUTE ONLY): 13 min  Charges:  $Gait Training: 8-22 mins                    G Codes:      Jasminne Mealy,KATHrine E 2016/08/30, 12:39 PM Carmelia Bake, PT, DPT 2016-08-30 Pager: [phone]

## 2016-08-26 ENCOUNTER — Telehealth: Payer: Self-pay | Admitting: *Deleted

## 2016-08-26 ENCOUNTER — Inpatient Hospital Stay: Admission: RE | Admit: 2016-08-26 | Payer: Medicare Other | Source: Ambulatory Visit

## 2016-08-26 NOTE — Telephone Encounter (Addendum)
Oncology Nurse Navigator Documentation  Spoke with Desiree Higgins re rescheduling of PET.   He stated he is waiting to arrange home nursing with [facility] s/p his wife's [facility] DC yesterday, would prefer to wait until this is arranged. I explained importance of scheduling PET ASAP so his wife can move forward with discussion with Dr. Isidore Moos.  He voiced understanding, understands I will move forward with arranging PET.  Gayleen Orem, RN, BSN, [place] [facility] Oncology Nurse [place] [facility] [place] [phone]

## 2016-08-27 ENCOUNTER — Telehealth: Payer: Self-pay | Admitting: *Deleted

## 2016-08-27 NOTE — Telephone Encounter (Signed)
Called patient, unable to reach. Unable to leave message.  

## 2016-08-27 NOTE — Telephone Encounter (Signed)
Called pt to set-up TCM hosp f/u appt no answer LMOM RTC.../lmb 

## 2016-08-28 DIAGNOSIS — M1710 Unilateral primary osteoarthritis, unspecified knee: Secondary | ICD-10-CM | POA: Diagnosis not present

## 2016-08-28 DIAGNOSIS — F17200 Nicotine dependence, unspecified, uncomplicated: Secondary | ICD-10-CM | POA: Diagnosis not present

## 2016-08-28 DIAGNOSIS — F329 Major depressive disorder, single episode, unspecified: Secondary | ICD-10-CM | POA: Diagnosis not present

## 2016-08-28 DIAGNOSIS — R221 Localized swelling, mass and lump, neck: Secondary | ICD-10-CM | POA: Diagnosis not present

## 2016-08-28 DIAGNOSIS — N183 Chronic kidney disease, stage 3 (moderate): Secondary | ICD-10-CM | POA: Diagnosis not present

## 2016-08-28 DIAGNOSIS — E785 Hyperlipidemia, unspecified: Secondary | ICD-10-CM | POA: Diagnosis not present

## 2016-08-28 DIAGNOSIS — D631 Anemia in chronic kidney disease: Secondary | ICD-10-CM | POA: Diagnosis not present

## 2016-08-28 DIAGNOSIS — I129 Hypertensive chronic kidney disease with stage 1 through stage 4 chronic kidney disease, or unspecified chronic kidney disease: Secondary | ICD-10-CM | POA: Diagnosis not present

## 2016-08-28 DIAGNOSIS — Z7982 Long term (current) use of aspirin: Secondary | ICD-10-CM | POA: Diagnosis not present

## 2016-08-28 DIAGNOSIS — F419 Anxiety disorder, unspecified: Secondary | ICD-10-CM | POA: Diagnosis not present

## 2016-08-28 DIAGNOSIS — E669 Obesity, unspecified: Secondary | ICD-10-CM | POA: Diagnosis not present

## 2016-08-28 DIAGNOSIS — J45909 Unspecified asthma, uncomplicated: Secondary | ICD-10-CM | POA: Diagnosis not present

## 2016-08-28 DIAGNOSIS — N318 Other neuromuscular dysfunction of bladder: Secondary | ICD-10-CM | POA: Diagnosis not present

## 2016-08-30 ENCOUNTER — Telehealth: Payer: Self-pay | Admitting: Oncology

## 2016-08-30 ENCOUNTER — Encounter: Payer: Self-pay | Admitting: Oncology

## 2016-08-30 DIAGNOSIS — N183 Chronic kidney disease, stage 3 (moderate): Secondary | ICD-10-CM | POA: Diagnosis not present

## 2016-08-30 DIAGNOSIS — D631 Anemia in chronic kidney disease: Secondary | ICD-10-CM | POA: Diagnosis not present

## 2016-08-30 DIAGNOSIS — M1710 Unilateral primary osteoarthritis, unspecified knee: Secondary | ICD-10-CM | POA: Diagnosis not present

## 2016-08-30 DIAGNOSIS — I129 Hypertensive chronic kidney disease with stage 1 through stage 4 chronic kidney disease, or unspecified chronic kidney disease: Secondary | ICD-10-CM | POA: Diagnosis not present

## 2016-08-30 DIAGNOSIS — R221 Localized swelling, mass and lump, neck: Secondary | ICD-10-CM | POA: Diagnosis not present

## 2016-08-30 DIAGNOSIS — J45909 Unspecified asthma, uncomplicated: Secondary | ICD-10-CM | POA: Diagnosis not present

## 2016-08-30 NOTE — Telephone Encounter (Signed)
Tried calling pt again still no answer LMOM RTC to make hosp f/u...Johny Chess

## 2016-08-30 NOTE — Telephone Encounter (Signed)
Pt confirmed appt, verified demo and insurance, mailed pt letter, in basket referring provider appt date/time. °

## 2016-08-31 ENCOUNTER — Other Ambulatory Visit: Payer: Self-pay | Admitting: Internal Medicine

## 2016-08-31 ENCOUNTER — Telehealth: Payer: Self-pay | Admitting: *Deleted

## 2016-08-31 DIAGNOSIS — D631 Anemia in chronic kidney disease: Secondary | ICD-10-CM | POA: Diagnosis not present

## 2016-08-31 DIAGNOSIS — I129 Hypertensive chronic kidney disease with stage 1 through stage 4 chronic kidney disease, or unspecified chronic kidney disease: Secondary | ICD-10-CM | POA: Diagnosis not present

## 2016-08-31 DIAGNOSIS — R221 Localized swelling, mass and lump, neck: Secondary | ICD-10-CM | POA: Diagnosis not present

## 2016-08-31 DIAGNOSIS — N183 Chronic kidney disease, stage 3 (moderate): Secondary | ICD-10-CM | POA: Diagnosis not present

## 2016-08-31 DIAGNOSIS — M1710 Unilateral primary osteoarthritis, unspecified knee: Secondary | ICD-10-CM | POA: Diagnosis not present

## 2016-08-31 DIAGNOSIS — J45909 Unspecified asthma, uncomplicated: Secondary | ICD-10-CM | POA: Diagnosis not present

## 2016-08-31 NOTE — Telephone Encounter (Addendum)
Oncology Nurse Navigator Documentation  Spoke with Desiree Higgins re upcoming appts:  PET scheduled for 2/2 0900 with 0830 arrival, with intent to move that to earlier date.  Subsequent to scan, appts to be scheduled with Drs. Isidore Moos and [place].  He indicated awareness of 1100 appt tomorrow with Dental Medicine.  He expressed concern re transportation as he is very difficult to safely ambulate wife up/down steps d/t her balance issues and weight.  I informed him that [facility] is able to provide that service, explained that they typically require 24-h notice to arrange transportation but I encouraged him to call re tomorrow's dental appt, I provided him [facility] phone number.  He stated his wife has multiple nursing and PT appts arranged with [facility].  He voiced understanding of priority nature of [facility]-related appts but expressed concern about conflicts with [facility]. I indicated I would communicate with his wife's CM, he provided contact information.  I later spoke with [facility] CM Desiree Higgins [phone]), explained pending [place] appt plan; she assured me [facility] will adjust appts to accommodate [facility] appts.  To that end, she understands I will inform her of appts as they are scheduled.  I later spoke with Desiree Higgins, informed him of my conversation with [facility].  He confirmed arrangement has been made with [facility] for tomorrow morning; I sent IB and LVMM to Dental Medicine to make them aware.  Gayleen Orem, RN, BSN, [place] [facility]ck Oncology Nurse [place] at [place] [phone]

## 2016-09-01 ENCOUNTER — Encounter: Payer: Self-pay | Admitting: *Deleted

## 2016-09-01 ENCOUNTER — Encounter (HOSPITAL_COMMUNITY): Payer: Self-pay | Admitting: Dentistry

## 2016-09-01 ENCOUNTER — Ambulatory Visit (HOSPITAL_COMMUNITY): Payer: Medicare Other

## 2016-09-01 ENCOUNTER — Ambulatory Visit (HOSPITAL_COMMUNITY): Payer: Self-pay | Admitting: Dentistry

## 2016-09-01 VITALS — BP 135/57 | HR 84 | Temp 98.5°F

## 2016-09-01 DIAGNOSIS — C01 Malignant neoplasm of base of tongue: Secondary | ICD-10-CM | POA: Diagnosis not present

## 2016-09-01 DIAGNOSIS — Z01818 Encounter for other preprocedural examination: Secondary | ICD-10-CM

## 2016-09-01 DIAGNOSIS — K0820 Unspecified atrophy of edentulous alveolar ridge: Secondary | ICD-10-CM

## 2016-09-01 DIAGNOSIS — K0530 Chronic periodontitis, unspecified: Secondary | ICD-10-CM

## 2016-09-01 DIAGNOSIS — K029 Dental caries, unspecified: Secondary | ICD-10-CM

## 2016-09-01 DIAGNOSIS — K0889 Other specified disorders of teeth and supporting structures: Secondary | ICD-10-CM

## 2016-09-01 DIAGNOSIS — R222 Localized swelling, mass and lump, trunk: Secondary | ICD-10-CM | POA: Diagnosis not present

## 2016-09-01 DIAGNOSIS — K083 Retained dental root: Secondary | ICD-10-CM

## 2016-09-01 DIAGNOSIS — C801 Malignant (primary) neoplasm, unspecified: Secondary | ICD-10-CM | POA: Diagnosis not present

## 2016-09-01 DIAGNOSIS — K036 Deposits [accretions] on teeth: Secondary | ICD-10-CM

## 2016-09-01 DIAGNOSIS — R6884 Jaw pain: Secondary | ICD-10-CM | POA: Diagnosis not present

## 2016-09-01 DIAGNOSIS — K06020 Generalized gingival recession, unspecified: Secondary | ICD-10-CM

## 2016-09-01 DIAGNOSIS — Z972 Presence of dental prosthetic device (complete) (partial): Secondary | ICD-10-CM

## 2016-09-01 DIAGNOSIS — K08409 Partial loss of teeth, unspecified cause, unspecified class: Secondary | ICD-10-CM

## 2016-09-01 DIAGNOSIS — K045 Chronic apical periodontitis: Secondary | ICD-10-CM

## 2016-09-01 NOTE — Progress Notes (Signed)
Oncology Nurse Navigator Documentation  Met with Ms. Baldus and her husband at conclusion of Dental Medicine appt. I provided them an Epic calendar of pending appts, emphasized PET scheduled for 2/2, indicated I am trying to reschedule earlier so appt can be scheduled with Dr. Squire. They voiced understanding, understand to call me with questions.  Rick , RN, BSN, CHPN Head & Neck Oncology Nurse Navigator [facility] at [place] [phone]  

## 2016-09-01 NOTE — Progress Notes (Signed)
DENTAL CONSULTATION  Date of Consultation:  09/01/2016 Patient Name:   Desiree Higgins Date of Birth:   [birth year] Medical Record Number: [id number]  VITALS: BP (!) 135/57 (BP Location: Left Arm)   Pulse 84   Temp 98.5 F (36.9 C) (Oral)   CHIEF COMPLAINT: Patient referred by Dr. [name] for a dental consultation.   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 Patient with anticipated radiation therapy and possible chemotherapy. Patient now seen as part of a pre-chemoradiation therapy dental protocol examination.  Patient currently denies acute toothaches, swellings, or abscesses. Patient indicates that she does have several broken teeth, however. Patient was seen by dentist a long time ago. This is at least 10-20 years ago. Patient had extractions at that time with no obvious complications. Patient had an upper complete denture fabricated at that time. Patient indicates that the upper denture "fits okay". Patient is unsure whether she has a lower partial denture.  She did not present with a lower partial denture-today.  The patient had the upper complete denture fabricated in [place]. Patient has not seen a dentist since she has been in [place] since 2004. Patient indicates that she "does not like the dentist" and has some dental phobia.   PROBLEM LIST: Patient Active Problem List   Diagnosis Date Noted  . CKD (chronic kidney disease) stage 3, GFR 30-59 ml/min 08/22/2016  . Dehydration 08/22/2016  . Altered mental state 08/22/2016  . Altered awareness, transient 08/22/2016  . Weakness   . Sore throat 08/12/2016  . Mass of right side of neck 08/12/2016  . Hypersomnolence 08/12/2016  . Loss of weight 08/12/2016  . Back pain 08/12/2016  . Acute upper respiratory infection 02/25/2016  . Chronic low back pain 02/25/2016  . Cat bite 12/07/2015  . Asthma 12/26/2013  . Smoker 12/26/2013  . Impaired glucose  tolerance 12/19/2012  . Osteopenia 11/10/2011  . Preventative health care 11/09/2011  . Vitamin D deficiency 06/04/2010  . FATIGUE 12/03/2009  . 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 MENOPAUSAL SYNDROME 11/21/2008  . OSTEOARTHRITIS, KNEE 11/21/2008  . Hyperlipidemia 09/22/2007  . Anxiety state 09/22/2007  . DEPRESSION 09/22/2007  . Essential hypertension 09/22/2007  . Allergic rhinitis 09/22/2007  . OVERACTIVE BLADDER 09/22/2007    PMH: Past Medical History:  Diagnosis Date  . ALLERGIC RHINITIS 09/22/2007   Qualifier: Diagnosis of  By: Jenny Reichmann MD, Hunt Oris   . ANXIETY 09/22/2007   Qualifier: Diagnosis of  By: Jenny Reichmann MD, Hunt Oris   . DEPRESSION 09/22/2007   Qualifier: Diagnosis of  By: Jenny Reichmann MD, Hunt Oris   . HYPERLIPIDEMIA 09/22/2007   Qualifier: Diagnosis of  By: Jenny Reichmann MD, Hunt Oris   . HYPERTENSION 09/22/2007   Qualifier: Diagnosis of  By: Jenny Reichmann MD, Hunt Oris   . Impaired glucose tolerance 12/19/2012  . OSTEOARTHRITIS, KNEE 11/21/2008   Qualifier: Diagnosis of  By: Jenny Reichmann MD, [place] BLADDER 09/22/2007   Qualifier: Diagnosis of  By: Jenny Reichmann MD, Hunt Oris   . Smoker 12/26/2013  . Unspecified asthma(493.90) 12/26/2013  . VITAMIN D DEFICIENCY 06/04/2010   Qualifier: Diagnosis of  By: Jenny Reichmann MD, Hunt Oris     PSH: Past Surgical History:  Procedure Laterality Date  . ABDOMINAL HYSTERECTOMY    . BREAST BIOPSY  1972  . CATARACT EXTRACTION     right  . CHOLECYSTECTOMY    . OOPHORECTOMY    . TONSILLECTOMY      ALLERGIES: Allergies  Allergen Reactions  . Ace Inhibitors     REACTION: cough    MEDICATIONS: Current Outpatient Prescriptions  Medication Sig Dispense Refill  . amLODipine (NORVASC) 5 MG tablet Take 1 tablet (5 mg total) by mouth daily. 30 tablet 0  . aspirin 81 MG EC tablet Take 81 mg by mouth daily. Swallow whole.    . DULoxetine (CYMBALTA) 60 MG capsule Take 1 capsule (60 mg total) by mouth daily. 90 capsule 3  . losartan-hydrochlorothiazide (HYZAAR) 100-25 MG tablet Take 1 tablet by  mouth daily. 90 tablet 3  . montelukast (SINGULAIR) 10 MG tablet Take 1 tablet (10 mg total) by mouth daily with breakfast. 90 tablet 1  . oxyCODONE-acetaminophen (PERCOCET) 10-325 MG per tablet Take 1 tablet by mouth every 4 (four) hours as needed.    Marland Kitchen PROAIR HFA 108 (90 Base) MCG/ACT inhaler INHALE TWO PUFFS INTO LUNGS EVERY 6 HOURS AS NEEDED FOR WHEEZING AND FOR SHORTNESS OF BREATH 9 each 1  . simvastatin (ZOCOR) 40 MG tablet Take 1 tablet (40 mg total) by mouth daily. 90 tablet 3  . triamcinolone (NASACORT) 55 MCG/ACT nasal inhaler Place 2 sprays into the nose daily. (Patient not taking: Reported on 08/22/2016) 1 Inhaler 12   No current facility-administered medications for this visit.     LABS: Lab Results  Component Value Date   WBC 14.1 (H) 08/25/2016   HGB 10.6 (L) 08/25/2016   HCT 32.4 (L) 08/25/2016   MCV 87.1 08/25/2016   PLT 347 08/25/2016      Component Value Date/Time   NA 136 08/25/2016 0422   K 3.6 08/25/2016 0422   CL 102 08/25/2016 0422   CO2 26 08/25/2016 0422   GLUCOSE 108 (H) 08/25/2016 0422   BUN 17 08/25/2016 0422   CREATININE 0.92 08/25/2016 0422   CALCIUM 10.3 08/25/2016 0422   GFRNONAA >60 08/25/2016 0422   GFRAA >60 08/25/2016 0422   No results found for: INR, PROTIME No results found for: PTT  SOCIAL HISTORY: Social History   Social History  . Marital status: Married    Spouse name: N/A  . Number of children: 2  . Years of education: N/A   Occupational History  . Not on file.   Social History Main Topics  . Smoking status: Current Some Day Smoker    Packs/day: 0.50    Years: 35.00  . Smokeless tobacco: Never Used  . Alcohol use No  . Drug use: No  . Sexual activity: Not on file   Other Topics Concern  . Not on file   Social History Narrative   CURRENT SMOKER-0.5 PPD x 35 plus yers   ALCOHOL USE - NO   MARRIED   WORK - RECEPTIONIST   1 DAUGHTER  In [place] one daughter in [place]  HISTORY: Family History  Problem Relation Age of Onset  . Cancer Mother     breast    REVIEW OF SYSTEMS: Reviewed With the patient and her husband as per history of present illness.  Psych: Patient does not like the dentist. Patient has some dental phobia.   DENTAL HISTORY: CHIEF COMPLAINT: Patient referred by Dr. [name] for a dental consultation.   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 Patient with anticipated radiation therapy and possible chemotherapy. Patient now seen as part of a pre-chemoradiation therapy dental protocol examination.  Patient currently denies acute toothaches, swellings, or  abscesses. Patient indicates that she does have several broken teeth, however. Patient was seen by dentist a long time ago. This is at least 10-20 years ago. Patient had extractions at that time with no obvious complications. Patient had an upper complete denture fabricated at that time. Patient indicates that the upper denture "fits okay". Patient is unsure whether she has a lower partial denture.  She did not present with a lower partial denture-today.  The patient had the upper complete denture fabricated in [place]. Patient has not seen a dentist since she has been in [place] since 2004. Patient indicates that she "does not like the dentist" and has some dental phobia.   DENTAL EXAMINATION: GENERAL: The patient is a well-developed, well-nourished female in no acute distress. Patient was transferred to the dental clinic via ambulance service due to mobility issues. HEAD AND NECK: The patient has significant right neck lymphadenopathy and also some left neck lymphadenopathy. The patient denies acute TMJ symptoms but has bilateral TMJ crepitus.  INTRAORAL EXAM:The patient has incipient xerostomia. There is no evidence of oral abscess formation. There is atrophy of the mandibular edentulous alveolar  ridges. DENTITION: Patient is missing tooth numbers 1-16, 17-21, and 28-32.  Tooth numbers 22, 23, and 27 are retained root segments.  PERIODONTAL: The patient has chronic periodontitis with plaque and calculus accumulations, generalized gingival recession, and generalized tooth mobility. There is moderate to severe bone loss noted.  DENTAL CARIES/SUBOPTIMAL RESTORATIONS:Patient has multiple dental caries affecting all remaining teeth as per dental charting form. ENDODONTIC: Patient currently denies acute pulpitis symptoms. Patient does have multiple areas of periapical pathology and radiolucency. Tooth #27 has had a previous root canal therapy that is now exposed to the oral environment.  CROWN AND BRIDGE: There are no crown or bridge restorations.  PROSTHODONTIC:  The patient has an upper complete denture. This has less than ideal stability and retention. The denture teeth are worn. Patient denies having a partial denture today. OCCLUSION: Patient has a poor occlusal scheme secondary to multiple missing teeth, multiple retained root segments, and lack of replacement of all missing teeth with clinically acceptable dental prostheses.   RADIOGRAPHIC INTERPRETATION: An orthopantogram was taken and supplemented with 3 lower periapical radiographs.  There are multiple missing teeth. There are retained root segments numbers 22, 23, and 27. Multiple dental caries are noted. There are multiple areas of periapical pathology and radiolucency. There is moderate to severe bone loss noted.    ASSESSMENTS: 1. 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 Anticipated radiation therapy and possible chemotherapy 3. Pre-chemoradiation therapy dental protocol examination 4. Chronic apical periodontitis 5. Dental caries 6. Multiple retained root segments 7. Chronic periodontitis with bone loss 8. Accretions 9. Generalized gingival recession 10. Generalized tooth mobility 11. Multiple missing teeth 12.  Atrophy of the mandibular edentulous alveolar ridges 13. Ill fitting maxillary complete denture 14. Poor occlusal scheme and malocclusion    PLAN/RECOMMENDATIONS: 1. I discussed the risks, benefits, and complications of various treatment options with the patient and her husband in relationship to her medical and dental conditions, anticipated radiation therapy and possible chemotherapy, and risk for side effects to include xerostomia, radiation caries, trismus, mucositis, taste changes, gum and jawbone changes, and risk for infection and osteoradionecrosis. We discussed various treatment options to include no treatment, extraction of remaining teeth with alveoloplasty, pre-prosthetic surgery as indicated, periodontal therapy, dental restorations, root canal therapy, crown and bridge therapy, implant therapy, and replacement of missing teeth as indicated. We also  discussed referral to an oral surgeon.  The patient refuses referral to an oral surgeon at this time. The patient currently wishes to proceed with extraction of remaining teeth with alveoloplasty in the operating room with general anesthesia. This will most likely be scheduled after the PET scan scheduled for 09-10-2016 unless Dr. Isidore Moos feels that the extraction procedures can proceed before the PET scan.  Patient will then follow up with the dentist of her choice for fabrication of upper lower complete dentures after adequate healing and approximately 3 months after the last radiation therapy has been provided.   2. Discussion of findings with medical team and coordination of future medical and dental care as needed.  I spent in excess of  120 minutes during the conduct of this consultation and >50% of this time involved direct face-to-face encounter for counseling and/or coordination of the patient's care.    [name], DDS

## 2016-09-01 NOTE — Patient Instructions (Signed)

## 2016-09-01 NOTE — Telephone Encounter (Signed)
Pt never called back to make hosp f/u. Per chart has 1 month f/u w/Dr. Jenny Reichmann in Feb. Closing encounter...Johny Chess

## 2016-09-02 ENCOUNTER — Telehealth: Payer: Self-pay | Admitting: *Deleted

## 2016-09-02 NOTE — Telephone Encounter (Addendum)
Oncology Nurse Navigator Documentation  Spoke with Mr. Desiree Higgins, received further clarification of wife's mobility/transportation challenges:  Uses a walker for ambulation in the apartment, unable to walk without.  There are 13 steps leading up to appt entrance, she cannot negotiate steps safely. Return from hospital last week was by ambulance, involved wheelchair managed by EMTs.  Similarly, [facility] transport for yesterday's Dental Medicine appt involved use of stretcher. He understands [facility] transport to MD appts is not covered by Medicare, that I am looking for alternate options for future appt transportation.  Gayleen Orem, RN, BSN, [place] Neck Oncology Nurse [place] at [place] [phone]

## 2016-09-02 NOTE — Telephone Encounter (Signed)
Oncology Nurse Navigator Documentation  Spoke with [facility], requested p16 staining for Ms. Delfino's 08/18/2016 path.  She noted report will be reissued to Dr. Pollie Friar office.  Gayleen Orem, RN, BSN, [place] Neck Oncology Nurse [place] at [place] [phone]

## 2016-09-03 DIAGNOSIS — J45909 Unspecified asthma, uncomplicated: Secondary | ICD-10-CM | POA: Diagnosis not present

## 2016-09-03 DIAGNOSIS — I129 Hypertensive chronic kidney disease with stage 1 through stage 4 chronic kidney disease, or unspecified chronic kidney disease: Secondary | ICD-10-CM | POA: Diagnosis not present

## 2016-09-03 DIAGNOSIS — D631 Anemia in chronic kidney disease: Secondary | ICD-10-CM | POA: Diagnosis not present

## 2016-09-03 DIAGNOSIS — N183 Chronic kidney disease, stage 3 (moderate): Secondary | ICD-10-CM | POA: Diagnosis not present

## 2016-09-03 DIAGNOSIS — R221 Localized swelling, mass and lump, neck: Secondary | ICD-10-CM | POA: Diagnosis not present

## 2016-09-03 DIAGNOSIS — M1710 Unilateral primary osteoarthritis, unspecified knee: Secondary | ICD-10-CM | POA: Diagnosis not present

## 2016-09-04 DIAGNOSIS — N183 Chronic kidney disease, stage 3 (moderate): Secondary | ICD-10-CM | POA: Diagnosis not present

## 2016-09-04 DIAGNOSIS — D631 Anemia in chronic kidney disease: Secondary | ICD-10-CM | POA: Diagnosis not present

## 2016-09-04 DIAGNOSIS — I129 Hypertensive chronic kidney disease with stage 1 through stage 4 chronic kidney disease, or unspecified chronic kidney disease: Secondary | ICD-10-CM | POA: Diagnosis not present

## 2016-09-04 DIAGNOSIS — J45909 Unspecified asthma, uncomplicated: Secondary | ICD-10-CM | POA: Diagnosis not present

## 2016-09-04 DIAGNOSIS — R221 Localized swelling, mass and lump, neck: Secondary | ICD-10-CM | POA: Diagnosis not present

## 2016-09-04 DIAGNOSIS — M1710 Unilateral primary osteoarthritis, unspecified knee: Secondary | ICD-10-CM | POA: Diagnosis not present

## 2016-09-06 DIAGNOSIS — D631 Anemia in chronic kidney disease: Secondary | ICD-10-CM | POA: Diagnosis not present

## 2016-09-06 DIAGNOSIS — I129 Hypertensive chronic kidney disease with stage 1 through stage 4 chronic kidney disease, or unspecified chronic kidney disease: Secondary | ICD-10-CM | POA: Diagnosis not present

## 2016-09-06 DIAGNOSIS — M1710 Unilateral primary osteoarthritis, unspecified knee: Secondary | ICD-10-CM | POA: Diagnosis not present

## 2016-09-06 DIAGNOSIS — N183 Chronic kidney disease, stage 3 (moderate): Secondary | ICD-10-CM | POA: Diagnosis not present

## 2016-09-06 DIAGNOSIS — R221 Localized swelling, mass and lump, neck: Secondary | ICD-10-CM | POA: Diagnosis not present

## 2016-09-06 DIAGNOSIS — J45909 Unspecified asthma, uncomplicated: Secondary | ICD-10-CM | POA: Diagnosis not present

## 2016-09-07 ENCOUNTER — Other Ambulatory Visit (HOSPITAL_COMMUNITY): Payer: Self-pay | Admitting: Dentistry

## 2016-09-07 ENCOUNTER — Encounter (HOSPITAL_COMMUNITY): Payer: Self-pay | Admitting: Dentistry

## 2016-09-08 ENCOUNTER — Telehealth: Payer: Self-pay | Admitting: *Deleted

## 2016-09-08 DIAGNOSIS — N183 Chronic kidney disease, stage 3 (moderate): Secondary | ICD-10-CM | POA: Diagnosis not present

## 2016-09-08 DIAGNOSIS — R221 Localized swelling, mass and lump, neck: Secondary | ICD-10-CM | POA: Diagnosis not present

## 2016-09-08 DIAGNOSIS — M1710 Unilateral primary osteoarthritis, unspecified knee: Secondary | ICD-10-CM | POA: Diagnosis not present

## 2016-09-08 DIAGNOSIS — I129 Hypertensive chronic kidney disease with stage 1 through stage 4 chronic kidney disease, or unspecified chronic kidney disease: Secondary | ICD-10-CM | POA: Diagnosis not present

## 2016-09-08 DIAGNOSIS — D631 Anemia in chronic kidney disease: Secondary | ICD-10-CM | POA: Diagnosis not present

## 2016-09-08 DIAGNOSIS — J45909 Unspecified asthma, uncomplicated: Secondary | ICD-10-CM | POA: Diagnosis not present

## 2016-09-08 NOTE — Progress Notes (Signed)
error 

## 2016-09-08 NOTE — Telephone Encounter (Signed)
Oncology Nurse Navigator Documentation  Mr. Kult called indicating he had FMLA papers to be completed so he can stay home to care for his wife.  He was highly distressed, stated he cannot leave the house to bring papers, needs completed/submitted by 2/9.  I offered to come by his home this afternoon to pick up papers so processing can start tomorrow rather than wait until Friday when he accompanies his wife for her PET.  He expressed appreciation.  I explained arrangements are being finalized for his wife's transport by [facility] for Friday's PET and next week's appts.  He voiced understanding.  Gayleen Orem, RN, BSN, [place] Neck Oncology Nurse [place] at [place] [phone]

## 2016-09-09 ENCOUNTER — Emergency Department (HOSPITAL_COMMUNITY): Payer: Medicare Other

## 2016-09-09 ENCOUNTER — Inpatient Hospital Stay (HOSPITAL_COMMUNITY)
Admission: EM | Admit: 2016-09-09 | Discharge: 2016-09-14 | DRG: 146 | Disposition: A | Discharge status: Home / Self Care | Payer: Medicare Other | Attending: Internal Medicine | Admitting: Internal Medicine

## 2016-09-09 ENCOUNTER — Telehealth: Payer: Self-pay | Admitting: *Deleted

## 2016-09-09 ENCOUNTER — Encounter (HOSPITAL_COMMUNITY): Payer: Self-pay

## 2016-09-09 ENCOUNTER — Encounter: Payer: Self-pay | Admitting: *Deleted

## 2016-09-09 ENCOUNTER — Telehealth: Payer: Self-pay | Admitting: Emergency Medicine

## 2016-09-09 ENCOUNTER — Telehealth: Payer: Self-pay | Admitting: Internal Medicine

## 2016-09-09 DIAGNOSIS — R627 Adult failure to thrive: Secondary | ICD-10-CM | POA: Diagnosis present

## 2016-09-09 DIAGNOSIS — C090 Malignant neoplasm of tonsillar fossa: Principal | ICD-10-CM | POA: Diagnosis present

## 2016-09-09 DIAGNOSIS — R4182 Altered mental status, unspecified: Secondary | ICD-10-CM | POA: Diagnosis not present

## 2016-09-09 DIAGNOSIS — E871 Hypo-osmolality and hyponatremia: Secondary | ICD-10-CM | POA: Diagnosis not present

## 2016-09-09 DIAGNOSIS — R1310 Dysphagia, unspecified: Secondary | ICD-10-CM | POA: Diagnosis not present

## 2016-09-09 DIAGNOSIS — Z9841 Cataract extraction status, right eye: Secondary | ICD-10-CM

## 2016-09-09 DIAGNOSIS — I10 Essential (primary) hypertension: Secondary | ICD-10-CM

## 2016-09-09 DIAGNOSIS — T502X5A Adverse effect of carbonic-anhydrase inhibitors, benzothiadiazides and other diuretics, initial encounter: Secondary | ICD-10-CM | POA: Diagnosis present

## 2016-09-09 DIAGNOSIS — I129 Hypertensive chronic kidney disease with stage 1 through stage 4 chronic kidney disease, or unspecified chronic kidney disease: Secondary | ICD-10-CM | POA: Diagnosis present

## 2016-09-09 DIAGNOSIS — G9341 Metabolic encephalopathy: Secondary | ICD-10-CM | POA: Diagnosis not present

## 2016-09-09 DIAGNOSIS — Z515 Encounter for palliative care: Secondary | ICD-10-CM

## 2016-09-09 DIAGNOSIS — R634 Abnormal weight loss: Secondary | ICD-10-CM | POA: Diagnosis present

## 2016-09-09 DIAGNOSIS — K0530 Chronic periodontitis, unspecified: Secondary | ICD-10-CM | POA: Diagnosis present

## 2016-09-09 DIAGNOSIS — F32A Depression, unspecified: Secondary | ICD-10-CM | POA: Diagnosis present

## 2016-09-09 DIAGNOSIS — Z888 Allergy status to other drugs, medicaments and biological substances status: Secondary | ICD-10-CM

## 2016-09-09 DIAGNOSIS — K5900 Constipation, unspecified: Secondary | ICD-10-CM | POA: Diagnosis present

## 2016-09-09 DIAGNOSIS — E876 Hypokalemia: Secondary | ICD-10-CM | POA: Diagnosis present

## 2016-09-09 DIAGNOSIS — J45909 Unspecified asthma, uncomplicated: Secondary | ICD-10-CM | POA: Diagnosis not present

## 2016-09-09 DIAGNOSIS — K029 Dental caries, unspecified: Secondary | ICD-10-CM | POA: Diagnosis present

## 2016-09-09 DIAGNOSIS — N183 Chronic kidney disease, stage 3 unspecified: Secondary | ICD-10-CM | POA: Diagnosis present

## 2016-09-09 DIAGNOSIS — Z90710 Acquired absence of both cervix and uterus: Secondary | ICD-10-CM

## 2016-09-09 DIAGNOSIS — E861 Hypovolemia: Secondary | ICD-10-CM | POA: Diagnosis present

## 2016-09-09 DIAGNOSIS — D631 Anemia in chronic kidney disease: Secondary | ICD-10-CM | POA: Diagnosis not present

## 2016-09-09 DIAGNOSIS — N179 Acute kidney failure, unspecified: Secondary | ICD-10-CM

## 2016-09-09 DIAGNOSIS — R339 Retention of urine, unspecified: Secondary | ICD-10-CM | POA: Diagnosis present

## 2016-09-09 DIAGNOSIS — R296 Repeated falls: Secondary | ICD-10-CM | POA: Diagnosis not present

## 2016-09-09 DIAGNOSIS — Z66 Do not resuscitate: Secondary | ICD-10-CM | POA: Diagnosis not present

## 2016-09-09 DIAGNOSIS — R413 Other amnesia: Secondary | ICD-10-CM | POA: Diagnosis not present

## 2016-09-09 DIAGNOSIS — Z7982 Long term (current) use of aspirin: Secondary | ICD-10-CM

## 2016-09-09 DIAGNOSIS — Z9889 Other specified postprocedural states: Secondary | ICD-10-CM

## 2016-09-09 DIAGNOSIS — C760 Malignant neoplasm of head, face and neck: Secondary | ICD-10-CM

## 2016-09-09 DIAGNOSIS — Z803 Family history of malignant neoplasm of breast: Secondary | ICD-10-CM

## 2016-09-09 DIAGNOSIS — C779 Secondary and unspecified malignant neoplasm of lymph node, unspecified: Secondary | ICD-10-CM | POA: Diagnosis present

## 2016-09-09 DIAGNOSIS — R531 Weakness: Secondary | ICD-10-CM | POA: Diagnosis not present

## 2016-09-09 DIAGNOSIS — Z87891 Personal history of nicotine dependence: Secondary | ICD-10-CM

## 2016-09-09 DIAGNOSIS — M1710 Unilateral primary osteoarthritis, unspecified knee: Secondary | ICD-10-CM | POA: Diagnosis not present

## 2016-09-09 DIAGNOSIS — R251 Tremor, unspecified: Secondary | ICD-10-CM | POA: Diagnosis not present

## 2016-09-09 DIAGNOSIS — M545 Low back pain: Secondary | ICD-10-CM | POA: Diagnosis not present

## 2016-09-09 DIAGNOSIS — R221 Localized swelling, mass and lump, neck: Secondary | ICD-10-CM | POA: Diagnosis not present

## 2016-09-09 DIAGNOSIS — R17 Unspecified jaundice: Secondary | ICD-10-CM | POA: Diagnosis present

## 2016-09-09 DIAGNOSIS — E860 Dehydration: Secondary | ICD-10-CM | POA: Diagnosis present

## 2016-09-09 DIAGNOSIS — K045 Chronic apical periodontitis: Secondary | ICD-10-CM | POA: Diagnosis present

## 2016-09-09 DIAGNOSIS — Z7189 Other specified counseling: Secondary | ICD-10-CM

## 2016-09-09 DIAGNOSIS — Z79899 Other long term (current) drug therapy: Secondary | ICD-10-CM

## 2016-09-09 DIAGNOSIS — I6789 Other cerebrovascular disease: Secondary | ICD-10-CM | POA: Diagnosis not present

## 2016-09-09 DIAGNOSIS — R479 Unspecified speech disturbances: Secondary | ICD-10-CM

## 2016-09-09 DIAGNOSIS — C099 Malignant neoplasm of tonsil, unspecified: Secondary | ICD-10-CM

## 2016-09-09 DIAGNOSIS — E785 Hyperlipidemia, unspecified: Secondary | ICD-10-CM | POA: Diagnosis present

## 2016-09-09 DIAGNOSIS — R530 Neoplastic (malignant) related fatigue: Secondary | ICD-10-CM

## 2016-09-09 DIAGNOSIS — F329 Major depressive disorder, single episode, unspecified: Secondary | ICD-10-CM | POA: Diagnosis present

## 2016-09-09 DIAGNOSIS — Z9049 Acquired absence of other specified parts of digestive tract: Secondary | ICD-10-CM

## 2016-09-09 HISTORY — DX: Disorder of kidney and ureter, unspecified: N28.9

## 2016-09-09 LAB — COMPREHENSIVE METABOLIC PANEL
ALBUMIN: 3.3 g/dL — AB (ref 3.5–5.0)
ALT: 14 U/L (ref 14–54)
ANION GAP: 9 (ref 5–15)
AST: 33 U/L (ref 15–41)
Alkaline Phosphatase: 93 U/L (ref 38–126)
BILIRUBIN TOTAL: 2 mg/dL — AB (ref 0.3–1.2)
BUN: 27 mg/dL — ABNORMAL HIGH (ref 6–20)
CALCIUM: 12.9 mg/dL — AB (ref 8.9–10.3)
CO2: 29 mmol/L (ref 22–32)
Chloride: 94 mmol/L — ABNORMAL LOW (ref 101–111)
Creatinine, Ser: 1.42 mg/dL — ABNORMAL HIGH (ref 0.44–1.00)
GFR calc non Af Amer: 36 mL/min — ABNORMAL LOW (ref >60)
GFR, EST AFRICAN AMERICAN: 42 mL/min — AB (ref >60)
GLUCOSE: 102 mg/dL — AB (ref 65–99)
Potassium: 5 mmol/L (ref 3.5–5.1)
Sodium: 132 mmol/L — ABNORMAL LOW (ref 135–145)
TOTAL PROTEIN: 7.4 g/dL (ref 6.5–8.1)

## 2016-09-09 LAB — CBC WITH DIFFERENTIAL/PLATELET
BASOS PCT: 1 %
Basophils Absolute: 0.1 10*3/uL (ref 0.0–0.1)
EOS ABS: 1.3 10*3/uL — AB (ref 0.0–0.7)
Eosinophils Relative: 9 %
HCT: 37.6 % (ref 36.0–46.0)
HEMOGLOBIN: 12 g/dL (ref 12.0–15.0)
Lymphocytes Relative: 18 %
Lymphs Abs: 2.6 10*3/uL (ref 0.7–4.0)
MCH: 28.6 pg (ref 26.0–34.0)
MCHC: 31.9 g/dL (ref 30.0–36.0)
MCV: 89.5 fL (ref 78.0–100.0)
Monocytes Absolute: 1.1 10*3/uL — ABNORMAL HIGH (ref 0.1–1.0)
Monocytes Relative: 7 %
NEUTROS PCT: 65 %
Neutro Abs: 9.8 10*3/uL — ABNORMAL HIGH (ref 1.7–7.7)
Platelets: 299 10*3/uL (ref 150–400)
RBC: 4.2 MIL/uL (ref 3.87–5.11)
RDW: 15.4 % (ref 11.5–15.5)
WBC: 14.9 10*3/uL — ABNORMAL HIGH (ref 4.0–10.5)

## 2016-09-09 LAB — URINALYSIS, ROUTINE W REFLEX MICROSCOPIC
BILIRUBIN URINE: NEGATIVE
GLUCOSE, UA: NEGATIVE mg/dL
Hgb urine dipstick: NEGATIVE
KETONES UR: NEGATIVE mg/dL
LEUKOCYTES UA: NEGATIVE
NITRITE: NEGATIVE
Protein, ur: NEGATIVE mg/dL
Specific Gravity, Urine: 1.013 (ref 1.005–1.030)
pH: 6 (ref 5.0–8.0)

## 2016-09-09 LAB — I-STAT TROPONIN, ED: TROPONIN I, POC: 0.01 ng/mL (ref 0.00–0.08)

## 2016-09-09 LAB — SALICYLATE LEVEL: Salicylate Lvl: <7 mg/dL (ref 2.8–30.0)

## 2016-09-09 LAB — I-STAT CG4 LACTIC ACID, ED: Lactic Acid, Venous: 1.45 mmol/L (ref 0.5–1.9)

## 2016-09-09 LAB — ETHANOL: Alcohol, Ethyl (B): <5 mg/dL (ref <5)

## 2016-09-09 LAB — ACETAMINOPHEN LEVEL

## 2016-09-09 MED ORDER — LOSARTAN POTASSIUM-HCTZ 100-25 MG PO TABS: 1.0000 | ORAL_TABLET | Freq: Every day | ORAL | Status: DC

## 2016-09-09 MED ORDER — LOSARTAN POTASSIUM 50 MG PO TABS
100.0000 mg | ORAL_TABLET | Freq: Every day | ORAL | Status: DC
  Administered 2016-09-10 – 2016-09-11 (×2): 100 mg via ORAL
  Filled 2016-09-09 (×3): qty 2

## 2016-09-09 MED ORDER — SODIUM CHLORIDE 0.9 % IV BOLUS (SEPSIS)
500.0000 mL | Freq: Once | INTRAVENOUS | Status: AC
  Administered 2016-09-09: 500 mL via INTRAVENOUS

## 2016-09-09 MED ORDER — ALBUTEROL SULFATE HFA 108 (90 BASE) MCG/ACT IN AERS: 2.0000 | INHALATION_SPRAY | Freq: Four times a day (QID) | RESPIRATORY_TRACT | Status: DC | PRN

## 2016-09-09 MED ORDER — OXYCODONE-ACETAMINOPHEN 10-325 MG PO TABS: 1.0000 | ORAL_TABLET | Freq: Four times a day (QID) | ORAL | Status: DC | PRN

## 2016-09-09 MED ORDER — OXYCODONE HCL 5 MG PO TABS
5.0000 mg | ORAL_TABLET | Freq: Four times a day (QID) | ORAL | Status: DC | PRN
  Administered 2016-09-09 – 2016-09-13 (×7): 5 mg via ORAL
  Filled 2016-09-09 (×7): qty 1

## 2016-09-09 MED ORDER — DULOXETINE HCL 60 MG PO CPEP
60.0000 mg | ORAL_CAPSULE | Freq: Every day | ORAL | Status: DC
  Administered 2016-09-10 – 2016-09-14 (×5): 60 mg via ORAL
  Filled 2016-09-09: qty 2
  Filled 2016-09-09 (×4): qty 1

## 2016-09-09 MED ORDER — HYDROCHLOROTHIAZIDE 25 MG PO TABS
25.0000 mg | ORAL_TABLET | Freq: Every day | ORAL | Status: DC
  Administered 2016-09-10: 25 mg via ORAL
  Filled 2016-09-09 (×3): qty 1

## 2016-09-09 MED ORDER — MONTELUKAST SODIUM 10 MG PO TABS
10.0000 mg | ORAL_TABLET | Freq: Every day | ORAL | Status: DC
  Administered 2016-09-10 – 2016-09-14 (×5): 10 mg via ORAL
  Filled 2016-09-09 (×5): qty 1

## 2016-09-09 MED ORDER — ASPIRIN EC 81 MG PO TBEC
81.0000 mg | DELAYED_RELEASE_TABLET | Freq: Every day | ORAL | Status: DC
  Administered 2016-09-10 – 2016-09-11 (×2): 81 mg via ORAL
  Filled 2016-09-09 (×3): qty 1

## 2016-09-09 MED ORDER — OXYCODONE-ACETAMINOPHEN 5-325 MG PO TABS
1.0000 | ORAL_TABLET | Freq: Four times a day (QID) | ORAL | Status: DC | PRN
  Administered 2016-09-09 – 2016-09-13 (×7): 1 via ORAL
  Filled 2016-09-09 (×7): qty 1

## 2016-09-09 MED ORDER — IBUPROFEN 200 MG PO TABS: 400.0000 mg | ORAL_TABLET | Freq: Four times a day (QID) | ORAL | Status: DC | PRN

## 2016-09-09 MED ORDER — SIMVASTATIN 40 MG PO TABS
40.0000 mg | ORAL_TABLET | Freq: Every day | ORAL | Status: DC
  Administered 2016-09-10 – 2016-09-13 (×4): 40 mg via ORAL
  Filled 2016-09-09 (×6): qty 1

## 2016-09-09 NOTE — Telephone Encounter (Signed)
Pt's physical therapist called and ask if Dr. Jenny Reichmann could order a speech therapist to help with pt. She is having issues swallowing and speaking. Please call Clair Gulling back as soon as possible, MS

## 2016-09-09 NOTE — Telephone Encounter (Signed)
Oncology Nurse Navigator Documentation  In context of of [name]'s arrival to [facility] ED and uncertain admission vs transfer to SNF:  Spoke with Luetta Nutting, [facility], cancelled tomorrow morning's PET.  She advised me to call [place] directly when ready to reschedule.  Called [facility], cancelled tomorrow morning's scheduled transport from home to [facility].  Gayleen Orem, RN, BSN, [place] Neck Oncology Nurse [place] at [place] [phone]

## 2016-09-09 NOTE — Clinical Social Work Note (Signed)
Clinical Social Work Assessment  Patient Details  Name: Desiree Higgins MRN: [id number] Date of Birth: April 30, 1946  Date of referral:  09/09/16               Reason for consult:  Discharge Planning                Permission sought to share information with:  Family Supports (Husband - Meliton Rattan ) Permission granted to share information::  Yes, Verbal Permission Granted  Name::        Agency::     Relationship::     Contact Information:     Housing/Transportation Living arrangements for the past 2 months:  Apartment (Second Story) Source of Information:  Patient, Spouse, Friend/Neighbor Patient Interpreter Needed:  None Criminal Activity/Legal Involvement Pertinent to Current Situation/Hospitalization:  No - Comment as needed Significant Relationships:  Spouse, Neighbor (Husband - [place]) Lives with:  Spouse Do you feel safe going back to the place where you live?  No Need for family participation in patient care:  Yes (Comment)  Care giving concerns:  Patient's husband reports that patient has little to no mobility noting that she has extreme weakness in her right leg. Patient's husband reports that he cares for patient 24 hours a day and has trouble getting patient up. Patient's husband reports that patient receives home health services and that they only stay for short periods of time. Patient reports no specific concerns about returning home stating "I guess it's not safe" patient proceeded to turn head towards husband. Patient's husband reports that he does not feel safe with patient returning home noting that he can't care for her.    Social Worker assessment / plan:  CSW spoke with patient at bedside, patient's husband Coral Rais) and neighbor [name]) present. Patient alert and oriented to self and situation. Patient was not able to verbalize her current location or date/time. Patient is currently receiving home health and patient's husband  and neighbor report that patient requires a higher level of care. Patient reports that they reside in an apartment on the second floor. Patient's neighbor reports that patient's husband has a hard time getting patient in and out of the house for appointments. CSW inquired about patient's interest in SNF. Patient reported that she is unsure at this time and needed to think about it. Patient will be followed by second shift CSW regarding discharge planning.  Employment status:    Insurance information:  Medicare PT Recommendations:  Not assessed at this time Information / Referral to community resources:     Patient/Family's Response to care:  Patient reports that she is unsure about going to a SNF and needs time to think about it. Patient's husband reports that he is open to patient going to a SNF, he requested list of local SNF's. CSW provided patient's husband with local SNF resource list.   Patient/Family's Understanding of and Emotional Response to Diagnosis, Current Treatment, and Prognosis:  Patient had trouble verbalizing her understanding of her current diagnosis, treatment, and prognosis. Patient appeared to be confused and had trouble answering some questions. Patient's husband verbalized that he is no longer able to solely care patient and that she requires a higher level of care.  Emotional Assessment Appearance:  Appears stated age Attitude/Demeanor/Rapport:  Other (Cooperative) Affect (typically observed):  Anxious, Restless Orientation:  Oriented to Self, Oriented to Situation Alcohol / Substance use:  Not Applicable Psych involvement (Current and /or in the community):  No (  Comment)  Discharge Needs  Concerns to be addressed:  Discharge Planning Concerns Readmission within the last 30 days:  Yes Current discharge risk:  None Barriers to Discharge:  No Barriers Identified   Burnis Medin, LCSW 09/09/2016, 2:30 PM

## 2016-09-09 NOTE — ED Provider Notes (Signed)
  Physical Exam  BP 135/73   Pulse 93   Temp 98.1 F (36.7 C) (Oral)   Resp 22   SpO2 92%   Physical Exam  ED Course  Procedures  MDM Patient had mild renal insufficiency. History of same. Has decreased a little bit more give IV fluid bolus. Mild hyponatremia. Appears to medically cleared for placement.       Davonna Belling, MD 09/09/16 [phone]

## 2016-09-09 NOTE — Progress Notes (Signed)
CSW completed PASSR [id number] A and FL-2 and faxed out Initial referrals and FL-2's to all [facility], per pt's husband's wishes, via the hub.  Please reconsult if future social work needs arise.  CSW signing off.  Alphonse Guild. Devinne Epstein, Latanya Presser, LCAS Clinical Social Worker Ph: [phone]

## 2016-09-09 NOTE — ED Notes (Signed)
Patient states that she has been having falls since December 2017.  Patient states that she lost her balance today but, did not fall.

## 2016-09-09 NOTE — NC FL2 (Signed)
[place] LEVEL OF CARE SCREENING TOOL     IDENTIFICATION  Patient Name: Desiree Higgins Birthdate: [birth year] Sex: female Admission Date (Current Location): 09/09/2016  [facility] and [place] Number:  Herbalist and Address:  [facility],  [place] [address], [place]      Provider Number: [id number]  Attending Physician Name and Address:  Davonna Belling, MD  Relative Name and Phone Number:       Current Level of Care: Hospital Recommended Level of Care: [place] Prior Approval Number:    Date Approved/Denied:   PASRR Number: [id number]  Discharge Plan: SNF    Current Diagnoses: Patient Active Problem List   Diagnosis Date Noted  . CKD (chronic kidney disease) stage 3, GFR 30-59 ml/min 08/22/2016  . Dehydration 08/22/2016  . Altered mental state 08/22/2016  . Altered awareness, transient 08/22/2016  . Weakness   . Sore throat 08/12/2016  . Mass of right side of neck 08/12/2016  . Hypersomnolence 08/12/2016  . Loss of weight 08/12/2016  . Back pain 08/12/2016  . Acute upper respiratory infection 02/25/2016  . Chronic low back pain 02/25/2016  . Cat bite 12/07/2015  . Asthma 12/26/2013  . Smoker 12/26/2013  . Impaired glucose tolerance 12/19/2012  . Osteopenia 11/10/2011  . Preventative health care 11/09/2011  . Vitamin D deficiency 06/04/2010  . FATIGUE 12/03/2009  . 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 MENOPAUSAL SYNDROME 11/21/2008  . OSTEOARTHRITIS, KNEE 11/21/2008  . Hyperlipidemia 09/22/2007  . Anxiety state 09/22/2007  . DEPRESSION 09/22/2007  . Essential hypertension 09/22/2007  . Allergic rhinitis 09/22/2007  . OVERACTIVE BLADDER 09/22/2007    Orientation RESPIRATION BLADDER Height & Weight        Normal Continent Weight:   Height:     BEHAVIORAL SYMPTOMS/MOOD NEUROLOGICAL BOWEL NUTRITION STATUS      Continent  (Regular)  AMBULATORY STATUS COMMUNICATION OF NEEDS Skin   Limited Assist Verbally  Normal                       Personal Care Assistance Level of Assistance  Bathing, Dressing           Functional Limitations Info             SPECIAL CARE FACTORS FREQUENCY  PT (By licensed PT), OT (By licensed OT)     PT Frequency: 5 OT Frequency: 5            Contractures      Additional Factors Info  Code Status Code Status Info: Prior             Current Medications (09/09/2016):  This is the current hospital active medication list No current facility-administered medications for this encounter.    Current Outpatient Prescriptions  Medication Sig Dispense Refill  . acetaminophen (TYLENOL) 500 MG tablet Take 1,000 mg by mouth every 6 (six) hours as needed for mild pain or moderate pain.    Marland Kitchen aspirin 81 MG EC tablet Take 81 mg by mouth daily. Swallow whole.    . DULoxetine (CYMBALTA) 60 MG capsule Take 1 capsule (60 mg total) by mouth daily. 90 capsule 3  . ibuprofen (ADVIL,MOTRIN) 200 MG tablet Take 400 mg by mouth every 6 (six) hours as needed for mild pain or moderate pain.    Marland Kitchen losartan-hydrochlorothiazide (HYZAAR) 100-25 MG tablet Take 1 tablet by mouth daily. 90 tablet 3  . montelukast (SINGULAIR) 10 MG tablet Take 1 tablet (10 mg total) by  mouth daily with breakfast. 90 tablet 1  . oxyCODONE-acetaminophen (PERCOCET) 10-325 MG per tablet Take 1 tablet by mouth every 6 (six) hours as needed for pain.     . phenol (CHLORASEPTIC) 1.4 % LIQD Use as directed 1 spray in the mouth or throat as needed for throat irritation / pain.    Marland Kitchen PROAIR HFA 108 (90 Base) MCG/ACT inhaler INHALE TWO PUFFS INTO LUNGS EVERY 6 HOURS AS NEEDED FOR WHEEZING AND FOR SHORTNESS OF BREATH 9 each 1  . simvastatin (ZOCOR) 40 MG tablet Take 1 tablet (40 mg total) by mouth daily. 90 tablet 3  . amLODipine (NORVASC) 5 MG tablet Take 1 tablet (5 mg total) by mouth daily. (Patient not taking: Reported on 09/09/2016) 30 tablet 0  . triamcinolone (NASACORT) 55 MCG/ACT nasal inhaler Place 2  sprays into the nose daily. (Patient not taking: Reported on 09/09/2016) 1 Inhaler 12     Discharge Medications: Please see discharge summary for a list of discharge medications.  Relevant Imaging Results:  Relevant Lab Results:   Additional Information [id number]  Alphonse Guild Maimuna Leaman, LCSWA

## 2016-09-09 NOTE — ED Notes (Addendum)
Went in to patient room and she was standing at the bedside without any clothes on and she had removed her IV and monitoring cables. She was asking where her husband was at (pt husband left earlier and told her he would be back in the morning). Pt only oriented to self. Bathed pt, new gown given, yellow socks applied and assisted to the restroom with the steady. Pt then said she did not need to go the restroom. Assisted her back to bed and Posey belt alarm placed for safety.

## 2016-09-09 NOTE — Telephone Encounter (Signed)
I dont think I am allowed to do this per Home Health as I have not seen pt  Does she mean a referral to outpatient Speech therapy eval?

## 2016-09-09 NOTE — ED Notes (Signed)
Recollect CMP sent to lab.  

## 2016-09-09 NOTE — Telephone Encounter (Signed)
[facility] called and your looking for medical social work order and to report AMS and muscle weakness. Please give her a call back thanks.

## 2016-09-09 NOTE — Progress Notes (Signed)
Oncology Nurse Navigator Documentation  Delivered FMLA paperwork obtained from Desiree Higgins last evening to [place], [employer] [facility].  Gayleen Orem, RN, BSN, [place] Oncology Nurse [place] [facility] [place] [phone]

## 2016-09-09 NOTE — ED Notes (Signed)
Bed: [id number] Expected date:  Expected time:  Means of arrival:  Comments: EMS- 71yo F, AMS/decreased urine output

## 2016-09-09 NOTE — Progress Notes (Signed)
CSW met with pt and with pt's husband and informed them that referrals to [facility] area SNF's have been faxed out via the hub and the CSW is awaiting the answer from these facilities on the availability of beds.  CSW educated the pt and pt's husband that in order to be placed directly from [facility] the pt must wait until the following day on 2/2 in order to find available beds.  Pt and pt's husband agreed to wait in the [facility] ED pending placement, per the EDP's recommendation for SNF placement.  Pt's husband and pt advised the CSW that they did not feel safe to discharge home with Home Health, which they have had prior to being admitted.  Pt asked that the CSW inform the pt's R.N that they wish to order food, get assistance with lowering the lights and assistance with raising and lowering the pt's bed.  CSW confirmed he will advise the pt's R.N.  CSW informed pt and pt's husband they will be advised by the CSW on the availability of available SNF beds on 2/2.    Alphonse Guild. Keyera Hattabaugh, Latanya Presser, LCAS Clinical Social Worker Ph: [phone]

## 2016-09-09 NOTE — ED Triage Notes (Signed)
Per EMS spouse states that patient has had periods of altered mental status today.  Per spouse patient is at times altered but, is worse today.  Per EMS patient has stage III Kidney failure and is being evaluated by a physician for a mass on her neck.

## 2016-09-09 NOTE — ED Provider Notes (Signed)
[facility] DEPT Provider Note   CSN: [id number] Arrival date & time: 09/09/16 1207     History    Chief Complaint  Patient presents with  . Altered Mental Status     HPI Carinne Barina is a 71 y.o. female.  71 year old female with past medical history including hypertension, hyperlipidemia, CK D, memory problems, possible neck mass who p/w altered mental status and frequent falls. The patient was admitted to the hospital several weeks ago for the same symptoms of altered mental status and frequent falls due to balance problems and generalized weakness. During her hospitalization, she was found to have a neck mass with lymph nodes concerning for malignancy. She is set up for outpatient workup of malignancy that is coming soon. Husband and neighbor report that her intermittent confusion, generalized weakness, and frequent falls due to balance problems have continued to worsen at home. They're concerned because the live on a second story apartment requiring 20 steps to get inside. This morning, has been "the patient was still asleep in bed to run a quick errand and when he returned she was in the bathroom sitting on the commode, unable to get up. He had to call neighbor to assist getting her off of the commode. Her confusion has been waxing and waning, similar to at previous hospitalization. No cough/cold symptoms, vomiting, fevers, or recent illness. They do note some urinary urgency symptoms. She has complained of mild low back pain may be due to a fall this morning. She has had a decreased appetite recently.   LEVEL 5 CAVEAT DUE TO AMS  Past Medical History:  Diagnosis Date  . ALLERGIC RHINITIS 09/22/2007   Qualifier: Diagnosis of  By: Jenny Reichmann MD, Hunt Oris   . ANXIETY 09/22/2007   Qualifier: Diagnosis of  By: Jenny Reichmann MD, Hunt Oris   . DEPRESSION 09/22/2007   Qualifier: Diagnosis of  By: Jenny Reichmann MD, Hunt Oris   . HYPERLIPIDEMIA 09/22/2007   Qualifier: Diagnosis of  By: Jenny Reichmann MD, Hunt Oris   . HYPERTENSION  09/22/2007   Qualifier: Diagnosis of  By: Jenny Reichmann MD, Hunt Oris   . Impaired glucose tolerance 12/19/2012  . OSTEOARTHRITIS, KNEE 11/21/2008   Qualifier: Diagnosis of  By: Jenny Reichmann MD, [place] BLADDER 09/22/2007   Qualifier: Diagnosis of  By: Jenny Reichmann MD, Hunt Oris   . Renal disorder   . Smoker 12/26/2013  . Unspecified asthma(493.90) 12/26/2013  . VITAMIN D DEFICIENCY 06/04/2010   Qualifier: Diagnosis of  By: Jenny Reichmann MD, Hunt Oris      Patient Active Problem List   Diagnosis Date Noted  . CKD (chronic kidney disease) stage 3, GFR 30-59 ml/min 08/22/2016  . Dehydration 08/22/2016  . Altered mental state 08/22/2016  . Altered awareness, transient 08/22/2016  . Weakness   . Sore throat 08/12/2016  . Mass of right side of neck 08/12/2016  . Hypersomnolence 08/12/2016  . Loss of weight 08/12/2016  . Back pain 08/12/2016  . Acute upper respiratory infection 02/25/2016  . Chronic low back pain 02/25/2016  . Cat bite 12/07/2015  . Asthma 12/26/2013  . Smoker 12/26/2013  . Impaired glucose tolerance 12/19/2012  . Osteopenia 11/10/2011  . Preventative health care 11/09/2011  . Vitamin D deficiency 06/04/2010  . FATIGUE 12/03/2009  . 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 MENOPAUSAL SYNDROME 11/21/2008  . OSTEOARTHRITIS, KNEE 11/21/2008  . Hyperlipidemia 09/22/2007  . Anxiety state 09/22/2007  . DEPRESSION 09/22/2007  . Essential hypertension 09/22/2007  . Allergic rhinitis 09/22/2007  . OVERACTIVE BLADDER  09/22/2007    Past Surgical History:  Procedure Laterality Date  . ABDOMINAL HYSTERECTOMY    . BREAST BIOPSY  1972  . CATARACT EXTRACTION     right  . CHOLECYSTECTOMY    . OOPHORECTOMY    . TONSILLECTOMY      OB History    No data available        Home Medications    Prior to Admission medications   Medication Sig Start Date End Date Taking? Authorizing Provider  acetaminophen (TYLENOL) 500 MG tablet Take 1,000 mg by mouth every 6 (six) hours as needed for mild pain or moderate  pain.   Yes Historical Provider, MD  aspirin 81 MG EC tablet Take 81 mg by mouth daily. Swallow whole.   Yes Historical Provider, MD  DULoxetine (CYMBALTA) 60 MG capsule Take 1 capsule (60 mg total) by mouth daily. 03/16/16  Yes Biagio Borg, MD  ibuprofen (ADVIL,MOTRIN) 200 MG tablet Take 400 mg by mouth every 6 (six) hours as needed for mild pain or moderate pain.   Yes Historical Provider, MD  losartan-hydrochlorothiazide (HYZAAR) 100-25 MG tablet Take 1 tablet by mouth daily. 03/16/16  Yes Biagio Borg, MD  montelukast (SINGULAIR) 10 MG tablet Take 1 tablet (10 mg total) by mouth daily with breakfast. 05/06/16  Yes Biagio Borg, MD  oxyCODONE-acetaminophen (PERCOCET) 10-325 MG per tablet Take 1 tablet by mouth every 6 (six) hours as needed for pain.    Yes Historical Provider, MD  phenol (CHLORASEPTIC) 1.4 % LIQD Use as directed 1 spray in the mouth or throat as needed for throat irritation / pain.   Yes Historical Provider, MD  PROAIR HFA 108 [phone] Base) MCG/ACT inhaler INHALE TWO PUFFS INTO LUNGS EVERY 6 HOURS AS NEEDED FOR WHEEZING AND FOR SHORTNESS OF BREATH 08/31/16  Yes Biagio Borg, MD  simvastatin (ZOCOR) 40 MG tablet Take 1 tablet (40 mg total) by mouth daily. 03/16/16  Yes Biagio Borg, MD  amLODipine (NORVASC) 5 MG tablet Take 1 tablet (5 mg total) by mouth daily. Patient not taking: Reported on 09/09/2016 08/26/16   Donne Hazel, MD  triamcinolone (NASACORT) 55 MCG/ACT nasal inhaler Place 2 sprays into the nose daily. Patient not taking: Reported on 09/09/2016 12/19/12   Biagio Borg, MD      Family History  Problem Relation Age of Onset  . Cancer Mother     breast     Social History  Substance Use Topics  . Smoking status: Current Some Day Smoker    Packs/day: 0.50    Years: 35.00  . Smokeless tobacco: Never Used  . Alcohol use No     Allergies     Ace inhibitors    Review of Systems Unable to obtain ROS 2/2 AMS   Physical Exam Updated Vital Signs BP 106/90   Pulse  94   Temp 98.1 F (36.7 C) (Oral)   Resp 18   SpO2 95%   Physical Exam  Constitutional: She appears well-developed and well-nourished. No distress.  HENT:  Head: Normocephalic and atraumatic.  Moist mucous membranes  Eyes: Conjunctivae are normal. Pupils are equal, round, and reactive to light.  Neck: Neck supple.  Cardiovascular: Normal rate, regular rhythm and normal heart sounds.   No murmur heard. Pulmonary/Chest: Effort normal and breath sounds normal.  Abdominal: Soft. Bowel sounds are normal. She exhibits no distension. There is no tenderness.  Musculoskeletal: She exhibits no edema.  Neurological: She is alert. No cranial nerve  deficit or sensory deficit. She exhibits normal muscle tone.  Fluent speech, oriented to person, some confusion w/ coordination testing, slow but symmetric w/ finger to nose testing, negative pronator drift Normal sensation throughout 5/5 strength BUE, LLE 4/5 strength RLE  Skin: Skin is warm and dry.  Punctate abrasion left base of index finger  Psychiatric: She has a normal mood and affect. Judgment normal.  Nursing note and vitals reviewed.     ED Treatments / Results  Labs (all labs ordered are listed, but only abnormal results are displayed) Labs Reviewed  ACETAMINOPHEN LEVEL - Abnormal; Notable for the following:       Result Value   Acetaminophen (Tylenol), Serum <10 (*)    All other components within normal limits  CBC WITH DIFFERENTIAL/PLATELET - Abnormal; Notable for the following:    WBC 14.9 (*)    Neutro Abs 9.8 (*)    Monocytes Absolute 1.1 (*)    Eosinophils Absolute 1.3 (*)    All other components within normal limits  URINALYSIS, ROUTINE W REFLEX MICROSCOPIC - Abnormal; Notable for the following:    APPearance HAZY (*)    All other components within normal limits  ETHANOL  SALICYLATE LEVEL  COMPREHENSIVE METABOLIC PANEL  I-STAT TROPOININ, ED  I-STAT CG4 LACTIC ACID, ED     EKG  EKG  Interpretation  Date/Time:  Thursday September 09 2016 12:38:06 EST Ventricular Rate:  81 PR Interval:    QRS Duration: 102 QT Interval:  376 QTC Calculation: 437 R Axis:   27 Text Interpretation:  Sinus rhythm No previous ECGs available Confirmed by Leonides Minder MD, Johntavius Shepard [phone]) on 09/09/2016 1:44:41 PM         Radiology Dg Lumbar Spine Complete  Result Date: 09/09/2016 CLINICAL DATA:  Patient states that she has been having falls since December 2017. Patient states that she lost her balance today but, did not fall. C/p generalized lower back pain. EXAM: LUMBAR SPINE - COMPLETE 4+ VIEW COMPARISON:  08/12/2016 FINDINGS: Moderate compression fracture of L2 is stable from the prior exam. There are no new fractures. Mild loss of disc height noted from L1-L2 through L4-L5 with moderate loss of disc height at L5-S1. There are small endplate osteophytes most evident at L1-L2 and L2-L3. Bones are demineralized. The soft tissues are unremarkable other than stable scattered aortic and iliac artery vascular calcifications. IMPRESSION: 1. No acute fracture or acute finding. 2. Compression fracture of L2 stable from the prior study. 3. Disc degenerative changes, also stable. Electronically Signed   By: Lajean Manes M.D.   On: 09/09/2016 14:39   Ct Head Wo Contrast  Result Date: 09/09/2016 CLINICAL DATA:  Altered mental status.  History falls. EXAM: CT HEAD WITHOUT CONTRAST TECHNIQUE: Contiguous axial images were obtained from the base of the skull through the vertex without intravenous contrast. COMPARISON:  CT head 08/22/2006 FINDINGS: Brain: Generalized atrophy and chronic microvascular ischemic changes. Negative for acute infarct. Negative for hemorrhage or mass. Vascular: No hyperdense vessel or unexpected calcification. Skull: Negative Sinuses/Orbits: Negative Other: None IMPRESSION: Atrophy and chronic microvascular ischemic change. No acute intracranial abnormality. Electronically Signed   By: Franchot Gallo M.D.   On: 09/09/2016 13:13    Procedures Procedures (including critical care time) Procedures  Medications Ordered in ED  Medications - No data to display   Initial Impression / Assessment and Plan / ED Course  I have reviewed the triage vital signs and the nursing notes.  Pertinent labs & imaging results that were available  during my care of the patient were reviewed by me and considered in my medical decision making (see chart for details).     Pt w/ several months of progressively worsening balance problems and frequent falls as well as intermittent confusion. She was admitted earlier this month and workup notable for neck mass with possible malignancy. On exam, she was awake and alert, comfortable with reassuring vital signs. No evidence of head trauma. Normal work of breathing. Head CT negative acute. EKG reassuring.  Contacted social work given concerns for patient's ability to function safely at home. Also contacted PT for evaluation.  Labwork shows normal lactate, and negative troponin, WBC 14.9 which is similar to WBC count a few weeks ago. UA without evidence of infection. No cough/sob sx and normal O2 sat, therefore I do not suspect pulmonary infection.   I discussed with social worker who has had long discussion with family and they are looking for rehabilitation placement options as she has had a 3 day qualifying stay this month. Patient will await placement here in the ED.  Final Clinical Impressions(s) / ED Diagnoses   Final diagnoses:  Memory impairment  Frequent falls  Weakness     New Prescriptions   No medications on file       Sharlett Iles, MD 09/09/16 1730

## 2016-09-09 NOTE — Telephone Encounter (Signed)
Please do a referral 

## 2016-09-10 ENCOUNTER — Encounter: Payer: Self-pay | Admitting: *Deleted

## 2016-09-10 ENCOUNTER — Encounter (HOSPITAL_COMMUNITY): Payer: Self-pay

## 2016-09-10 ENCOUNTER — Ambulatory Visit (HOSPITAL_COMMUNITY): Payer: Medicare Other

## 2016-09-10 DIAGNOSIS — J45909 Unspecified asthma, uncomplicated: Secondary | ICD-10-CM | POA: Diagnosis not present

## 2016-09-10 DIAGNOSIS — Z7982 Long term (current) use of aspirin: Secondary | ICD-10-CM | POA: Diagnosis not present

## 2016-09-10 DIAGNOSIS — R296 Repeated falls: Secondary | ICD-10-CM | POA: Diagnosis not present

## 2016-09-10 DIAGNOSIS — C801 Malignant (primary) neoplasm, unspecified: Secondary | ICD-10-CM | POA: Diagnosis not present

## 2016-09-10 DIAGNOSIS — K0530 Chronic periodontitis, unspecified: Secondary | ICD-10-CM | POA: Diagnosis present

## 2016-09-10 DIAGNOSIS — Z79899 Other long term (current) drug therapy: Secondary | ICD-10-CM | POA: Diagnosis not present

## 2016-09-10 DIAGNOSIS — R530 Neoplastic (malignant) related fatigue: Secondary | ICD-10-CM | POA: Diagnosis not present

## 2016-09-10 DIAGNOSIS — I517 Cardiomegaly: Secondary | ICD-10-CM | POA: Diagnosis not present

## 2016-09-10 DIAGNOSIS — Z9889 Other specified postprocedural states: Secondary | ICD-10-CM | POA: Diagnosis not present

## 2016-09-10 DIAGNOSIS — N3281 Overactive bladder: Secondary | ICD-10-CM | POA: Diagnosis not present

## 2016-09-10 DIAGNOSIS — Z90710 Acquired absence of both cervix and uterus: Secondary | ICD-10-CM | POA: Diagnosis not present

## 2016-09-10 DIAGNOSIS — R599 Enlarged lymph nodes, unspecified: Secondary | ICD-10-CM | POA: Diagnosis not present

## 2016-09-10 DIAGNOSIS — I25119 Atherosclerotic heart disease of native coronary artery with unspecified angina pectoris: Secondary | ICD-10-CM | POA: Diagnosis not present

## 2016-09-10 DIAGNOSIS — R17 Unspecified jaundice: Secondary | ICD-10-CM | POA: Diagnosis present

## 2016-09-10 DIAGNOSIS — Z9049 Acquired absence of other specified parts of digestive tract: Secondary | ICD-10-CM | POA: Diagnosis not present

## 2016-09-10 DIAGNOSIS — G9340 Encephalopathy, unspecified: Secondary | ICD-10-CM | POA: Diagnosis not present

## 2016-09-10 DIAGNOSIS — Z87891 Personal history of nicotine dependence: Secondary | ICD-10-CM | POA: Diagnosis not present

## 2016-09-10 DIAGNOSIS — Z803 Family history of malignant neoplasm of breast: Secondary | ICD-10-CM | POA: Diagnosis not present

## 2016-09-10 DIAGNOSIS — I10 Essential (primary) hypertension: Secondary | ICD-10-CM | POA: Diagnosis not present

## 2016-09-10 DIAGNOSIS — M4856XA Collapsed vertebra, not elsewhere classified, lumbar region, initial encounter for fracture: Secondary | ICD-10-CM

## 2016-09-10 DIAGNOSIS — F339 Major depressive disorder, recurrent, unspecified: Secondary | ICD-10-CM | POA: Diagnosis not present

## 2016-09-10 DIAGNOSIS — Z7189 Other specified counseling: Secondary | ICD-10-CM | POA: Diagnosis not present

## 2016-09-10 DIAGNOSIS — C775 Secondary and unspecified malignant neoplasm of intrapelvic lymph nodes: Secondary | ICD-10-CM | POA: Diagnosis not present

## 2016-09-10 DIAGNOSIS — E860 Dehydration: Secondary | ICD-10-CM

## 2016-09-10 DIAGNOSIS — C090 Malignant neoplasm of tonsillar fossa: Principal | ICD-10-CM

## 2016-09-10 DIAGNOSIS — Z515 Encounter for palliative care: Secondary | ICD-10-CM | POA: Diagnosis not present

## 2016-09-10 DIAGNOSIS — C779 Secondary and unspecified malignant neoplasm of lymph node, unspecified: Secondary | ICD-10-CM | POA: Diagnosis present

## 2016-09-10 DIAGNOSIS — F17200 Nicotine dependence, unspecified, uncomplicated: Secondary | ICD-10-CM

## 2016-09-10 DIAGNOSIS — C7989 Secondary malignant neoplasm of other specified sites: Secondary | ICD-10-CM | POA: Diagnosis not present

## 2016-09-10 DIAGNOSIS — N179 Acute kidney failure, unspecified: Secondary | ICD-10-CM | POA: Diagnosis not present

## 2016-09-10 DIAGNOSIS — N2889 Other specified disorders of kidney and ureter: Secondary | ICD-10-CM | POA: Diagnosis not present

## 2016-09-10 DIAGNOSIS — R531 Weakness: Secondary | ICD-10-CM | POA: Diagnosis not present

## 2016-09-10 DIAGNOSIS — Z9841 Cataract extraction status, right eye: Secondary | ICD-10-CM | POA: Diagnosis not present

## 2016-09-10 DIAGNOSIS — C771 Secondary and unspecified malignant neoplasm of intrathoracic lymph nodes: Secondary | ICD-10-CM | POA: Diagnosis not present

## 2016-09-10 DIAGNOSIS — K029 Dental caries, unspecified: Secondary | ICD-10-CM | POA: Diagnosis present

## 2016-09-10 DIAGNOSIS — N183 Chronic kidney disease, stage 3 (moderate): Secondary | ICD-10-CM | POA: Diagnosis not present

## 2016-09-10 DIAGNOSIS — C760 Malignant neoplasm of head, face and neck: Secondary | ICD-10-CM

## 2016-09-10 DIAGNOSIS — R1310 Dysphagia, unspecified: Secondary | ICD-10-CM | POA: Diagnosis present

## 2016-09-10 DIAGNOSIS — E785 Hyperlipidemia, unspecified: Secondary | ICD-10-CM | POA: Diagnosis present

## 2016-09-10 DIAGNOSIS — K045 Chronic apical periodontitis: Secondary | ICD-10-CM | POA: Diagnosis present

## 2016-09-10 DIAGNOSIS — R634 Abnormal weight loss: Secondary | ICD-10-CM | POA: Diagnosis present

## 2016-09-10 DIAGNOSIS — J309 Allergic rhinitis, unspecified: Secondary | ICD-10-CM | POA: Diagnosis not present

## 2016-09-10 DIAGNOSIS — Z888 Allergy status to other drugs, medicaments and biological substances status: Secondary | ICD-10-CM | POA: Diagnosis not present

## 2016-09-10 DIAGNOSIS — E871 Hypo-osmolality and hyponatremia: Secondary | ICD-10-CM | POA: Diagnosis present

## 2016-09-10 DIAGNOSIS — Z9181 History of falling: Secondary | ICD-10-CM

## 2016-09-10 DIAGNOSIS — F17210 Nicotine dependence, cigarettes, uncomplicated: Secondary | ICD-10-CM | POA: Diagnosis not present

## 2016-09-10 DIAGNOSIS — K5900 Constipation, unspecified: Secondary | ICD-10-CM

## 2016-09-10 DIAGNOSIS — G9341 Metabolic encephalopathy: Secondary | ICD-10-CM | POA: Diagnosis not present

## 2016-09-10 DIAGNOSIS — C7800 Secondary malignant neoplasm of unspecified lung: Secondary | ICD-10-CM | POA: Diagnosis not present

## 2016-09-10 DIAGNOSIS — R4182 Altered mental status, unspecified: Secondary | ICD-10-CM

## 2016-09-10 DIAGNOSIS — R591 Generalized enlarged lymph nodes: Secondary | ICD-10-CM | POA: Diagnosis not present

## 2016-09-10 LAB — BASIC METABOLIC PANEL
ANION GAP: 8 (ref 5–15)
BUN: 24 mg/dL — ABNORMAL HIGH (ref 6–20)
CALCIUM: 12.5 mg/dL — AB (ref 8.9–10.3)
CO2: 29 mmol/L (ref 22–32)
Chloride: 96 mmol/L — ABNORMAL LOW (ref 101–111)
Creatinine, Ser: 1.4 mg/dL — ABNORMAL HIGH (ref 0.44–1.00)
GFR, EST AFRICAN AMERICAN: 42 mL/min — AB (ref >60)
GFR, EST NON AFRICAN AMERICAN: 37 mL/min — AB (ref >60)
GLUCOSE: 138 mg/dL — AB (ref 65–99)
POTASSIUM: 3.1 mmol/L — AB (ref 3.5–5.1)
Sodium: 133 mmol/L — ABNORMAL LOW (ref 135–145)

## 2016-09-10 LAB — CREATININE, SERUM
CREATININE: 1.39 mg/dL — AB (ref 0.44–1.00)
GFR, EST AFRICAN AMERICAN: 43 mL/min — AB (ref >60)
GFR, EST NON AFRICAN AMERICAN: 37 mL/min — AB (ref >60)

## 2016-09-10 LAB — CBC
HEMATOCRIT: 36.1 % (ref 36.0–46.0)
HEMOGLOBIN: 11.8 g/dL — AB (ref 12.0–15.0)
MCH: 29.3 pg (ref 26.0–34.0)
MCHC: 32.7 g/dL (ref 30.0–36.0)
MCV: 89.6 fL (ref 78.0–100.0)
Platelets: 325 10*3/uL (ref 150–400)
RBC: 4.03 MIL/uL (ref 3.87–5.11)
RDW: 14.7 % (ref 11.5–15.5)
WBC: 18.8 10*3/uL — ABNORMAL HIGH (ref 4.0–10.5)

## 2016-09-10 MED ORDER — CALCITONIN (SALMON) 200 UNIT/ML IJ SOLN
100.0000 [IU] | Freq: Two times a day (BID) | INTRAMUSCULAR | Status: DC
  Administered 2016-09-10 – 2016-09-13 (×8): 100 [IU] via SUBCUTANEOUS
  Filled 2016-09-10 (×9): qty 0.5

## 2016-09-10 MED ORDER — HEPARIN SODIUM (PORCINE) 5000 UNIT/ML IJ SOLN
5000.0000 [IU] | Freq: Three times a day (TID) | INTRAMUSCULAR | Status: DC
  Administered 2016-09-10 – 2016-09-14 (×8): 5000 [IU] via SUBCUTANEOUS
  Filled 2016-09-10 (×8): qty 1

## 2016-09-10 MED ORDER — ENSURE ENLIVE PO LIQD
237.0000 mL | Freq: Two times a day (BID) | ORAL | Status: DC
  Administered 2016-09-10 – 2016-09-14 (×7): 237 mL via ORAL
  Filled 2016-09-10 (×2): qty 237

## 2016-09-10 MED ORDER — POLYETHYLENE GLYCOL 3350 17 G PO PACK
17.0000 g | PACK | Freq: Two times a day (BID) | ORAL | Status: DC
  Administered 2016-09-10: 17 g via ORAL
  Filled 2016-09-10: qty 1

## 2016-09-10 MED ORDER — MAGNESIUM CITRATE PO SOLN: 1.0000 | Freq: Once | ORAL | Status: DC | PRN

## 2016-09-10 MED ORDER — SODIUM CHLORIDE 0.9 % IV SOLN
INTRAVENOUS | Status: DC
  Administered 2016-09-10 – 2016-09-11 (×2): via INTRAVENOUS

## 2016-09-10 MED ORDER — BISACODYL 10 MG RE SUPP: 10.0000 mg | Freq: Every day | RECTAL | Status: DC | PRN

## 2016-09-10 MED ORDER — ALBUTEROL SULFATE (2.5 MG/3ML) 0.083% IN NEBU: 2.5000 mg | INHALATION_SOLUTION | RESPIRATORY_TRACT | Status: DC | PRN

## 2016-09-10 MED ORDER — ACETAMINOPHEN 500 MG PO TABS: 1000.0000 mg | ORAL_TABLET | Freq: Four times a day (QID) | ORAL | Status: DC | PRN

## 2016-09-10 MED ORDER — SODIUM CHLORIDE 0.9 % IV SOLN
INTRAVENOUS | Status: DC
  Administered 2016-09-10: 10:00:00 via INTRAVENOUS

## 2016-09-10 MED ORDER — HYDRALAZINE HCL 20 MG/ML IJ SOLN: 10.0000 mg | INTRAMUSCULAR | Status: DC | PRN

## 2016-09-10 MED ORDER — SENNA 8.6 MG PO TABS
1.0000 | ORAL_TABLET | Freq: Two times a day (BID) | ORAL | Status: DC
  Administered 2016-09-10 – 2016-09-14 (×7): 8.6 mg via ORAL
  Filled 2016-09-10 (×7): qty 1

## 2016-09-10 MED ORDER — ALBUTEROL SULFATE (2.5 MG/3ML) 0.083% IN NEBU: 2.5000 mg | INHALATION_SOLUTION | Freq: Four times a day (QID) | RESPIRATORY_TRACT | Status: DC | PRN

## 2016-09-10 MED ORDER — AMLODIPINE BESYLATE 5 MG PO TABS
5.0000 mg | ORAL_TABLET | Freq: Every day | ORAL | Status: DC
  Administered 2016-09-10 – 2016-09-11 (×2): 5 mg via ORAL
  Filled 2016-09-10 (×2): qty 1

## 2016-09-10 MED ORDER — PHENOL 1.4 % MT LIQD: 1.0000 | OROMUCOSAL | Status: DC | PRN

## 2016-09-10 NOTE — ED Notes (Signed)
Hospitalist at bedside 

## 2016-09-10 NOTE — Plan of Care (Signed)
Problem: Safety: Goal: Ability to remain free from injury will improve Outcome: Completed/Met Date Met: 09/10/16 Bed alarm set. Discussed safety prevention plan with patient and spouse

## 2016-09-10 NOTE — Progress Notes (Signed)
[facility] ED CM notified [facility] coordinator [phone] about pt admission, interest in snf at d/c, to follow for d/c needs

## 2016-09-10 NOTE — ED Notes (Signed)
Social work at bedside.  

## 2016-09-10 NOTE — ED Notes (Signed)
Pt set up with breakfast tray.

## 2016-09-10 NOTE — ED Notes (Signed)
Called floor to start 20 min timer.

## 2016-09-10 NOTE — Progress Notes (Signed)
CSW spoke with patient regarding discharge plans. Patient is being admitted to hospital but would still prefer SNF once discharged. Patient has bed offers at this time. CSW provided patient/ spouse with bed offers. CSW will follow up once closer to DC.   Kingsley Spittle, LCSWA Clinical Social Worker [phone]

## 2016-09-10 NOTE — Telephone Encounter (Signed)
There were 2 choices with the last response, but I think you might mean the outpatient speech therapy  I will refer

## 2016-09-10 NOTE — Evaluation (Signed)
Physical Therapy Evaluation Patient Details Name: Desiree Higgins MRN: [id number] DOB: 30-Jul-1946 Today's Date: 09/10/2016   History of Present Illness  Desiree Higgins  is a 71 y.o. female, With past medical history of hypertension, hyperlipidemia, depression, CK-MB, patient presents with recent hospitalization where she was diagnosed with right tonsillar cancer. Admitted2/1/18 with falls , weakness, AMS, inability to ambulate and spouse having difficulty with caring for the patient.  Clinical Impression  The patient is very restless, decreased ability to follow commands and some difficulty  With expression. The patient is much weaker than documented in previous admission PT notes. She was assisted to sitting on a bedpan beside the bed and assisted back to bed.Pt admitted with above diagnosis. Pt currently with functional limitations due to the deficits listed below (see PT Problem List).  Pt will benefit from skilled PT to increase their independence and safety with mobility to allow discharge to the venue listed below.       Follow Up Recommendations SNF;Supervision/Assistance - 24 hour    Equipment Recommendations  None recommended by PT    Recommendations for Other Services       Precautions / Restrictions Precautions Precautions: Fall      Mobility  Bed Mobility Overal bed mobility: Needs Assistance Bed Mobility: Supine to Sit;Sit to Supine;Rolling Rolling: Mod assist   Supine to sit: Mod assist;+2 for safety/equipment Sit to supine: Mod assist;+2 for safety/equipment   General bed mobility comments: multimodal cues to roll for bedpan to bed placed. Performed BP lift to remove.  Assist with legs and trunk to sit up at the bed edge and back to supine  Transfers Overall transfer level: Needs assistance Equipment used: Rolling walker (2 wheeled) Transfers: Sit to/from [facility] Sit to Stand: Mod assist;+2 safety/equipment Stand pivot transfers: +2  safety/equipment;Mod assist       General transfer comment: multi modal cues to stand, pivoted to chair with a bedpan. Did not urinate. assist to rise and steady at the RW. Multimodal cues for use of the RW.  Ambulation/Gait                Stairs            Wheelchair Mobility    Modified Rankin (Stroke Patients Only)       Balance Overall balance assessment: History of Falls;Needs assistance Sitting-balance support: Feet supported;Bilateral upper extremity supported Sitting balance-Leahy Scale: Fair     Standing balance support: Bilateral upper extremity supported;During functional activity Standing balance-Leahy Scale: Poor Standing balance comment: patient distractible, decreased FC                             Pertinent Vitals/Pain Pain Assessment: Faces Faces Pain Scale: Hurts little more Pain Location: back Pain Descriptors / Indicators: Aching;Moaning Pain Intervention(s): Monitored during session;Repositioned    Home Living Infor from previous admission Family/patient expects to be discharged to:: Unsure Living Arrangements: Spouse/significant other Available Help at Discharge: Family Type of Home: Apartment Home Access: Stairs to enter Entrance Stairs-Rails: Right Entrance Stairs-Number of Steps: flight Home Layout: One level Home Equipment: Environmental consultant - 2 wheels      Prior Function Level of Independence: Needs assistance   Gait / Transfers Assistance Needed: walked with RW             Hand Dominance        Extremity/Trunk Assessment   Upper Extremity Assessment Upper Extremity Assessment: Generalized weakness    Lower  Extremity Assessment Lower Extremity Assessment: Generalized weakness    Cervical / Trunk Assessment Cervical / Trunk Assessment: Normal  Communication   Communication: Expressive difficulties;Receptive difficulties  Cognition Arousal/Alertness: Awake/alert Behavior During Therapy:  Restless Overall Cognitive Status: Impaired/Different from baseline Area of Impairment: Orientation;Memory;Attention;Following commands;Safety/judgement;Awareness;Problem solving Orientation Level: Time;Situation   Memory: Decreased short-term memory Following Commands: Follows one step commands inconsistently Safety/Judgement: Decreased awareness of safety;Decreased awareness of deficits   Problem Solving: Slow processing;Requires verbal cues      General Comments      Exercises     Assessment/Plan    PT Assessment Patient needs continued PT services  PT Problem List Decreased strength;Decreased activity tolerance;Decreased balance;Decreased mobility;Decreased cognition;Decreased knowledge of use of DME;Decreased safety awareness;Decreased knowledge of precautions          PT Treatment Interventions DME instruction;Gait training;Functional mobility training;Therapeutic activities;Therapeutic exercise;Patient/family education    PT Goals (Current goals can be found in the Care Plan section)  Acute Rehab PT Goals Patient Stated Goal: to get  her up and walk PT Goal Formulation: With family Time For Goal Achievement: 09/24/16 Potential to Achieve Goals: Fair    Frequency Min 2X/week   Barriers to discharge Decreased caregiver support      Co-evaluation               End of Session Equipment Utilized During Treatment: Gait belt Activity Tolerance: Patient limited by fatigue Patient left: in bed;with call bell/phone within reach;with bed alarm set Nurse Communication: Mobility status         Time: [id number] PT Time Calculation (min) (ACUTE ONLY): 19 min   Charges:   PT Evaluation $PT Eval Low Complexity: 1 Procedure     PT G CodesClaretha Higgins 09/10/2016, 11:21 AM  Desiree Higgins PT [phone]

## 2016-09-10 NOTE — H&P (Addendum)
TRH H&P   Patient Demographics:    Desiree Higgins, is a 71 y.o. female  MRN: [id number]   DOB - 09-Apr-1946  Admit Date - 09/09/2016  Outpatient Primary MD for the patient is [name], MD  Referring MD/NP/PA: DR Hughes Better  Outpatient Specialists: Oncology Dr Alvy Bimler  Patient coming from: Home  Chief Complaint  Patient presents with  . Altered Mental Status      HPI:    Desiree Higgins  is a 71 y.o. female, With past medical history of hypertension, hyperlipidemia, depression, CK-MB, patient presents with recent hospitalization where she was diagnosed with right tonsillar cancer, she quit smoking last month, history of 50 years smoking at least 1 pack per day. Patient presents with altered mental status, fatigue and multiple falls, multiple falls as an ongoing problem from previous admission, with poor ambulation at baseline, mainly with walker, patient with intermittent confusion, more lethargic than her baseline, no focal deficits, no slurred speech, as well as progressive weakness, unable to up from commode, which prompted family to call EMS, no cough, fever, chills, nausea or vomiting, but she reports constipation, odynophagia to solids secondary to tonsillar cancer, and poor appetite. In ED workup significant for calcium of 12.9, creatinine of 1.43, K of 5, T bili of 2,negativeUA,  I was called to admit.    Review of systems:    In addition to the HPI above, No Fever-chills, No Headache, No changes with Vision or hearing, No problems swallowing Liquids,Reports odynophagia to solids No Chest pain, Cough or Shortness of Breath, No Abdominal pain, No Nausea or Vommitting, reports constipation No Blood in stool or Urine, No dysuria, No new skin rashes or bruises, No new joints pains-aches,  No new weakness, tingling, numbness in any extremity, she reports generalized weakness and  multiple falls No recent weight gain or loss, No polyuria, polydypsia or polyphagia, No significant Mental Stressors.  A full 10 point Review of Systems was done, except as stated above, all other Review of Systems were negative.   With Past History of the following :    Past Medical History:  Diagnosis Date  . ALLERGIC RHINITIS 09/22/2007   Qualifier: Diagnosis of  By: Jenny Reichmann MD, Hunt Oris   . ANXIETY 09/22/2007   Qualifier: Diagnosis of  By: Jenny Reichmann MD, Hunt Oris   . DEPRESSION 09/22/2007   Qualifier: Diagnosis of  By: Jenny Reichmann MD, Hunt Oris   . HYPERLIPIDEMIA 09/22/2007   Qualifier: Diagnosis of  By: Jenny Reichmann MD, Hunt Oris   . HYPERTENSION 09/22/2007   Qualifier: Diagnosis of  By: Jenny Reichmann MD, Hunt Oris   . Impaired glucose tolerance 12/19/2012  . OSTEOARTHRITIS, KNEE 11/21/2008   Qualifier: Diagnosis of  By: Jenny Reichmann MD, Kenmar BLADDER 09/22/2007   Qualifier: Diagnosis of  By: Jenny Reichmann MD, Hunt Oris   . Renal disorder   . Smoker 12/26/2013  .  Unspecified asthma(493.90) 12/26/2013  . VITAMIN D DEFICIENCY 06/04/2010   Qualifier: Diagnosis of  By: Jenny Reichmann MD, Hunt Oris       Past Surgical History:  Procedure Laterality Date  . ABDOMINAL HYSTERECTOMY    . BREAST BIOPSY  1972  . CATARACT EXTRACTION     right  . CHOLECYSTECTOMY    . OOPHORECTOMY    . TONSILLECTOMY        Social History:     Social History  Substance Use Topics  . Smoking status: Current Some Day Smoker    Packs/day: 0.50    Years: 35.00  . Smokeless tobacco: Never Used  . Alcohol use No     Lives - At home  Mobility - with assistance   Family History :     Family History  Problem Relation Age of Onset  . Cancer Mother     breast      Home Medications:   Prior to Admission medications   Medication Sig Start Date End Date Taking? Authorizing Provider  acetaminophen (TYLENOL) 500 MG tablet Take 1,000 mg by mouth every 6 (six) hours as needed for mild pain or moderate pain.   Yes Historical Provider, MD  aspirin  81 MG EC tablet Take 81 mg by mouth daily. Swallow whole.   Yes Historical Provider, MD  DULoxetine (CYMBALTA) 60 MG capsule Take 1 capsule (60 mg total) by mouth daily. 03/16/16  Yes Biagio Borg, MD  ibuprofen (ADVIL,MOTRIN) 200 MG tablet Take 400 mg by mouth every 6 (six) hours as needed for mild pain or moderate pain.   Yes Historical Provider, MD  losartan-hydrochlorothiazide (HYZAAR) 100-25 MG tablet Take 1 tablet by mouth daily. 03/16/16  Yes Biagio Borg, MD  montelukast (SINGULAIR) 10 MG tablet Take 1 tablet (10 mg total) by mouth daily with breakfast. 05/06/16  Yes Biagio Borg, MD  oxyCODONE-acetaminophen (PERCOCET) 10-325 MG per tablet Take 1 tablet by mouth every 6 (six) hours as needed for pain.    Yes Historical Provider, MD  phenol (CHLORASEPTIC) 1.4 % LIQD Use as directed 1 spray in the mouth or throat as needed for throat irritation / pain.   Yes Historical Provider, MD  PROAIR HFA 108 [id number] Base) MCG/ACT inhaler INHALE TWO PUFFS INTO LUNGS EVERY 6 HOURS AS NEEDED FOR WHEEZING AND FOR SHORTNESS OF BREATH 08/31/16  Yes Biagio Borg, MD  simvastatin (ZOCOR) 40 MG tablet Take 1 tablet (40 mg total) by mouth daily. 03/16/16  Yes Biagio Borg, MD  amLODipine (NORVASC) 5 MG tablet Take 1 tablet (5 mg total) by mouth daily. Patient not taking: Reported on 09/09/2016 08/26/16   Donne Hazel, MD  triamcinolone (NASACORT) 55 MCG/ACT nasal inhaler Place 2 sprays into the nose daily. Patient not taking: Reported on 09/09/2016 12/19/12   Biagio Borg, MD     Allergies:     Allergies  Allergen Reactions  . Ace Inhibitors     REACTION: cough     Physical Exam:   Vitals  Blood pressure 142/98, pulse 84, temperature 98.1 F (36.7 C), temperature source Oral, resp. rate 15, SpO2 98 %.   1. General Frail elderly female lying in bed in NAD,   2. Normal affect and insight, Not Suicidal or Homicidal, Awake Alert, Oriented X 3.  3. No F.N deficits, ALL C.Nerves Intact, Strength 5/5 all 4  extremities, Sensation intact all 4 extremities, Plantars down going.  4. Ears and Eyes appear Normal, Conjunctivae clear, PERRLA. Dry  Oral Mucosa. Ulceration and swelling at right tonsillar area, with bilateral cervical adenopathy, poor dentation.  5. Supple Neck, No JVD, right neck swelling, No Carotid Bruits.  6. Symmetrical Chest wall movement, Good air movement bilaterally, CTAB.  7. RRR, No Gallops, Rubs or Murmurs, No Parasternal Heave.  8. Positive Bowel Sounds, Abdomen Soft, No tenderness, No organomegaly appriciated,No rebound -guarding or rigidity.  9.  No Cyanosis, Delayed  Skin Turgor, No Skin Rash or Bruise.  10. Good muscle tone,  joints appear normal , no effusions, Normal ROM.    Data Review:    CBC  Recent Labs Lab 09/09/16 1326  WBC 14.9*  HGB 12.0  HCT 37.6  PLT 299  MCV 89.5  MCH 28.6  MCHC 31.9  RDW 15.4  LYMPHSABS 2.6  MONOABS 1.1*  EOSABS 1.3*  BASOSABS 0.1   ------------------------------------------------------------------------------------------------------------------  Chemistries   Recent Labs Lab 09/09/16 1808  NA 132*  K 5.0  CL 94*  CO2 29  GLUCOSE 102*  BUN 27*  CREATININE 1.42*  CALCIUM 12.9*  AST 33  ALT 14  ALKPHOS 93  BILITOT 2.0*   ------------------------------------------------------------------------------------------------------------------ CrCl cannot be calculated (Unknown ideal weight.). ------------------------------------------------------------------------------------------------------------------ No results for input(s): TSH, T4TOTAL, T3FREE, THYROIDAB in the last 72 hours.  Invalid input(s): FREET3  Coagulation profile No results for input(s): INR, PROTIME in the last 168 hours. ------------------------------------------------------------------------------------------------------------------- No results for input(s): DDIMER in the last 72  hours. -------------------------------------------------------------------------------------------------------------------  Cardiac Enzymes No results for input(s): CKMB, TROPONINI, MYOGLOBIN in the last 168 hours.  Invalid input(s): CK ------------------------------------------------------------------------------------------------------------------ No results found for: BNP   ---------------------------------------------------------------------------------------------------------------  Urinalysis    Component Value Date/Time   COLORURINE YELLOW 09/09/2016 1326   APPEARANCEUR HAZY (A) 09/09/2016 1326   LABSPEC 1.013 09/09/2016 1326   PHURINE 6.0 09/09/2016 1326   GLUCOSEU NEGATIVE 09/09/2016 1326   GLUCOSEU NEGATIVE 08/12/2016 1720   HGBUR NEGATIVE 09/09/2016 1326   BILIRUBINUR NEGATIVE 09/09/2016 1326   [place] 09/09/2016 1326   PROTEINUR NEGATIVE 09/09/2016 1326   UROBILINOGEN 0.2 08/12/2016 1720   NITRITE NEGATIVE 09/09/2016 1326   [place] 09/09/2016 1326    ----------------------------------------------------------------------------------------------------------------   Imaging Results:    Dg Lumbar Spine Complete  Result Date: 09/09/2016 CLINICAL DATA:  Patient states that she has been having falls since December 2017. Patient states that she lost her balance today but, did not fall. C/p generalized lower back pain. EXAM: LUMBAR SPINE - COMPLETE 4+ VIEW COMPARISON:  08/12/2016 FINDINGS: Moderate compression fracture of L2 is stable from the prior exam. There are no new fractures. Mild loss of disc height noted from L1-L2 through L4-L5 with moderate loss of disc height at L5-S1. There are small endplate osteophytes most evident at L1-L2 and L2-L3. Bones are demineralized. The soft tissues are unremarkable other than stable scattered aortic and iliac artery vascular calcifications. IMPRESSION: 1. No acute fracture or acute finding. 2. Compression  fracture of L2 stable from the prior study. 3. Disc degenerative changes, also stable. Electronically Signed   By: Lajean Manes M.D.   On: 09/09/2016 14:39   Ct Head Wo Contrast  Result Date: 09/09/2016 CLINICAL DATA:  Altered mental status.  History falls. EXAM: CT HEAD WITHOUT CONTRAST TECHNIQUE: Contiguous axial images were obtained from the base of the skull through the vertex without intravenous contrast. COMPARISON:  CT head 08/22/2006 FINDINGS: Brain: Generalized atrophy and chronic microvascular ischemic changes. Negative for acute infarct. Negative for hemorrhage or mass. Vascular: No hyperdense vessel or unexpected calcification. Skull: Negative Sinuses/Orbits:  Negative Other: None IMPRESSION: Atrophy and chronic microvascular ischemic change. No acute intracranial abnormality. Electronically Signed   By: Franchot Gallo M.D.   On: 09/09/2016 13:13    My personal review of EKG: Rhythm NSR, Rate  81 /min, QTc 437 , no Acute ST changes   Assessment & Plan:    Active Problems:   Depression   Essential hypertension   Asthma   CKD (chronic kidney disease) stage 3, GFR 30-59 ml/min   Dehydration   Hypercalcemia   Head and neck cancer (HCC)  Hypercalcemia - With calcium 12.9 on admission, appears to be symptomatic as she presents with altered mental status and progressive weakness. - Multifactorial secondary to dehydration, chlorothiazide use and malignancy - Continue with IV fluids, will start on calcitonin subcutaneous every 12 hours, reassess calcium level and 12 hours. - holding off IV bisphosphonates due to recent renal failure and significant poor dentition . Oncology recommendation - Check CMP in a.m. - Better on telemetry  Failure to thrive - Patient with progressive weakness, poor appetite get as she is unable to tolerate oral given her throat malignancy, will start her in short, will consult PT, will need SNF placement. - Continue with IV fluids for dehydration - Start on  ensure  Acute encephalopathy -Secondary to hypercalcemia and dehydration, no focal deficits,  Poor dentation - D/W Dr Marline Backbone, plan for extraction of remaning teeth this Monday at 9:30.  Hyponatremia - due to volume depletion and hydrochlorothiazide ,cont with IV NS.  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 Alvy Bimler to see - Continue with clear liquid diet and advance as tolerated given dysphagia from the tonsillar cancer.  AKI on CKD stage III - Continue with IV fluids, hold hydrochlorothiazide, if no improvement in function in a.m. we'll hold lisinopril  Hyperbilirubinemia - Total bili is elevated at 2, most likely to dehydration, will recheck in a.m. after volume resuscitation  Hypertension - Hold hydrochlorothiazide, continue with lisinopril, remains uncontrolled, will start on amlodipine, continue with when necessary hydralazine  Depression - Continue with home medication  Asthma - No wheezing, continue with when necessary albuterol  DVT Prophylaxis Heparin -   SCDs  AM Labs Ordered, also please review Full Orders  Family Communication: Admission, patients condition and plan of care including tests being ordered have been discussed with the patient who indicate understanding and agree with the plan and Code Status.  Code Status Full  Likely DC to  Pending PT, may need SNF  Condition GUARDED    Consults called: Oncology Dr Alvy Bimler  Admission status: inpatient  Time spent in minutes : 55 minutes   Montie Gelardi M.D on 09/10/2016 at 8:30 AM  Between 7am to 7pm - Pager - [phone]. After 7pm go to [email or web address] - password [facility]  [facility] - Office  [phone]

## 2016-09-10 NOTE — Progress Notes (Signed)
Oncology Nurse Navigator Documentation  Visited Desiree Higgins [facility] to check on her well-being.  She was resting comfortably in bed.  I discussed plans for her to have dental extractions on Monday, to receive further scans, for Dr. Isidore Moos to see her, and for her eventual DC to SNF for additional care.  She voiced understanding though I had to reinforce information several times.  She understands I will continue to follow her during this admission.  Gayleen Orem, RN, BSN, [place] On[facility] Nurse [place] [facility] [place] [phone]

## 2016-09-10 NOTE — ED Notes (Signed)
PT at bedside for eval.

## 2016-09-10 NOTE — Progress Notes (Signed)
[facility] CONSULT NOTE  Patient Care Team: Biagio Borg, MD as PCP - General Leota Sauers, RN as Oncology Nurse Navigator (Oncology) Eppie Gibson, MD as Attending Physician (Radiation Oncology) Heath Lark, MD as Consulting Physician (Hematology and Oncology) Karie Mainland, RD as Dietitian (Nutrition)  CHIEF COMPLAINTS/PURPOSE OF CONSULTATION:  Newly diagnosed head and neck cancer, malignant hypercalcemia, severe weight loss, dysphagia and acute renal failure  HISTORY OF PRESENTING ILLNESS:  Desiree Higgins 71 y.o. female is seen in the emergency department. 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  According to the patient, the first initial presentation was due to progressive weakness, right neck mass/swelling, progressive weight loss and dizziness. She saw her primary care doctor on 08/12/2016. He clearly documented she had over 20 pound weight loss between July 2017 to January 2018. History from the patient is difficult as she appears to be somewhat somnolent. She also have clear memory deficit with difficulties with short-term memory she denies any hearing deficit or changes in the quality of her voice. She had difficulties with chewing food, swallowing difficulties, painful swallowing, and abnormal weight loss. On 08/18/2016, she was referred to see ENT. 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 On 08/22/2016, she was admitted to the hospital for weakness. On 08/22/2016, CT scan of the head and neck showed diffuse bilateral lymphadenopathy on both sides of the neck. MRI of the head showed no intracranial metastasis The patient has significant smoking history for 50 years, at least 1 pack per day. She denies excessive alcohol intake The patient stated she has not had a bowel movement for many weeks She complained of mild nausea The head  and neck navigator nurse had been trying to get her seen at the cancer center in the past 2 weeks but due to difficulties with her weakness and transportation, she was unable to see be or the radiation oncologist The patient was brought to the emergency department due to altered mental status yesterday. Repeat labs showed hypercalcemia and acute renal failure  MEDICAL HISTORY:  Past Medical History:  Diagnosis Date  . ALLERGIC RHINITIS 09/22/2007   Qualifier: Diagnosis of  By: Jenny Reichmann MD, Hunt Oris   . ANXIETY 09/22/2007   Qualifier: Diagnosis of  By: Jenny Reichmann MD, Hunt Oris   . DEPRESSION 09/22/2007   Qualifier: Diagnosis of  By: Jenny Reichmann MD, Hunt Oris   . HYPERLIPIDEMIA 09/22/2007   Qualifier: Diagnosis of  By: Jenny Reichmann MD, Hunt Oris   . HYPERTENSION 09/22/2007   Qualifier: Diagnosis of  By: Jenny Reichmann MD, Hunt Oris   . Impaired glucose tolerance 12/19/2012  . OSTEOARTHRITIS, KNEE 11/21/2008   Qualifier: Diagnosis of  By: Jenny Reichmann MD, [place] BLADDER 09/22/2007   Qualifier: Diagnosis of  By: Jenny Reichmann MD, Hunt Oris   . Renal disorder   . Smoker 12/26/2013  . Unspecified asthma(493.90) 12/26/2013  . VITAMIN D DEFICIENCY 06/04/2010   Qualifier: Diagnosis of  By: Jenny Reichmann MD, Hunt Oris     SURGICAL HISTORY: Past Surgical History:  Procedure Laterality Date  . ABDOMINAL HYSTERECTOMY    . BREAST BIOPSY  1972  . CATARACT EXTRACTION     right  . CHOLECYSTECTOMY    . OOPHORECTOMY    . TONSILLECTOMY      SOCIAL HISTORY: Social History   Social History  . Marital status: Married    Spouse name: N/A  .  Number of children: 2  . Years of education: N/A   Occupational History  . Not on file.   Social History Main Topics  . Smoking status: Current Some Day Smoker    Packs/day: 0.50    Years: 35.00  . Smokeless tobacco: Never Used  . Alcohol use No  . Drug use: No  . Sexual activity: Not on file   Other Topics Concern  . Not on file   Social History Narrative   CURRENT SMOKER-0.5 PPD x 35 plus yers    ALCOHOL USE - NO   MARRIED   WORK - RECEPTIONIST   1 DAUGHTER  In [place] one daughter in [place] HISTORY: Family History  Problem Relation Age of Onset  . Cancer Mother     breast    ALLERGIES:  is allergic to ace inhibitors.  MEDICATIONS:  Current Facility-Administered Medications  Medication Dose Route Frequency Provider Last Rate Last Dose  . 0.9 %  sodium chloride infusion   Intravenous Continuous Dawood S Elgergawy, MD      . albuterol (PROVENTIL HFA;VENTOLIN HFA) 108 (90 Base) MCG/ACT inhaler 2 puff  2 puff Inhalation Q6H PRN Davonna Belling, MD      . aspirin EC tablet 81 mg  81 mg Oral Daily Davonna Belling, MD      . calcitonin (MIACALCIN) injection 100 Units  100 Units Subcutaneous Q12H Silver Huguenin Elgergawy, MD      . DULoxetine (CYMBALTA) DR capsule 60 mg  60 mg Oral Daily Davonna Belling, MD      . hydrochlorothiazide (HYDRODIURIL) tablet 25 mg  25 mg Oral Daily Davonna Belling, MD      . ibuprofen (ADVIL,MOTRIN) tablet 400 mg  400 mg Oral Q6H PRN Davonna Belling, MD      . losartan (COZAAR) tablet 100 mg  100 mg Oral Daily Davonna Belling, MD      . montelukast (SINGULAIR) tablet 10 mg  10 mg Oral Q breakfast Davonna Belling, MD      . oxyCODONE-acetaminophen (PERCOCET/ROXICET) 5-325 MG per tablet 1 tablet  1 tablet Oral Q6H PRN Davonna Belling, MD   1 tablet at 09/10/16 [id number]   And  . oxyCODONE (Oxy IR/ROXICODONE) immediate release tablet 5 mg  5 mg Oral Q6H PRN Davonna Belling, MD   5 mg at 09/10/16 0607  . simvastatin (ZOCOR) tablet 40 mg  40 mg Oral Daily Davonna Belling, MD       Current Outpatient Prescriptions  Medication Sig Dispense Refill  . acetaminophen (TYLENOL) 500 MG tablet Take 1,000 mg by mouth every 6 (six) hours as needed for mild pain or moderate pain.    Marland Kitchen aspirin 81 MG EC tablet Take 81 mg by mouth daily. Swallow whole.    . DULoxetine (CYMBALTA) 60 MG capsule Take 1 capsule (60 mg total) by mouth daily. 90  capsule 3  . ibuprofen (ADVIL,MOTRIN) 200 MG tablet Take 400 mg by mouth every 6 (six) hours as needed for mild pain or moderate pain.    Marland Kitchen losartan-hydrochlorothiazide (HYZAAR) 100-25 MG tablet Take 1 tablet by mouth daily. 90 tablet 3  . montelukast (SINGULAIR) 10 MG tablet Take 1 tablet (10 mg total) by mouth daily with breakfast. 90 tablet 1  . oxyCODONE-acetaminophen (PERCOCET) 10-325 MG per tablet Take 1 tablet by mouth every 6 (six) hours as needed for pain.     . phenol (CHLORASEPTIC) 1.4 % LIQD Use as directed 1 spray in  the mouth or throat as needed for throat irritation / pain.    Marland Kitchen PROAIR HFA 108 (90 Base) MCG/ACT inhaler INHALE TWO PUFFS INTO LUNGS EVERY 6 HOURS AS NEEDED FOR WHEEZING AND FOR SHORTNESS OF BREATH 9 each 1  . simvastatin (ZOCOR) 40 MG tablet Take 1 tablet (40 mg total) by mouth daily. 90 tablet 3  . amLODipine (NORVASC) 5 MG tablet Take 1 tablet (5 mg total) by mouth daily. (Patient not taking: Reported on 09/09/2016) 30 tablet 0  . triamcinolone (NASACORT) 55 MCG/ACT nasal inhaler Place 2 sprays into the nose daily. (Patient not taking: Reported on 09/09/2016) 1 Inhaler 12    REVIEW OF SYSTEMS:   Constitutional: Denies fevers, chills or abnormal night sweats Eyes: Denies blurriness of vision, double vision or watery eyes Respiratory: Denies cough, dyspnea or wheezes Cardiovascular: Denies palpitation, chest discomfort or lower extremity swelling Skin: Denies abnormal skin rashes Behavioral/Psych: Mood is stable, no new changes  All other systems were reviewed with the patient and are negative.  PHYSICAL EXAMINATION: ECOG PERFORMANCE STATUS: 3 - Symptomatic, >50% confined to bed  Vitals:   09/10/16 0605 09/10/16 0800  BP: 143/68 142/98  Pulse: 77 84  Resp: 18 15  Temp:     There were no vitals filed for this visit.  GENERAL:alert, no distress and comfortable. She appears intermittently arousable but overall somnolent SKIN: skin color, texture, turgor are  normal, no rashes or significant lesions EYES: normal, conjunctiva are pink and non-injected, sclera clear OROPHARYNX:no exudate, no erythema and lips, buccal mucosa, and tongue normal . Fullness is noted in the back of her throat NECK: supple, thyroid normal size, non-tender, without nodularity LYMPH:  She has diffuse bilateral lymphadenopathy in the neck, right greater than the left LUNGS: clear to auscultation and percussion with normal breathing effort HEART: regular rate & rhythm and no murmurs and no lower extremity edema ABDOMEN:abdomen soft, non-tender and normal bowel sounds Musculoskeletal:no cyanosis of digits and no clubbing  PSYCH: alert appears to be oriented in place but not in time NEURO: no focal motor/sensory deficits although difficult to examine as the patient is not very cooperative  LABORATORY DATA:  I have reviewed the data as listed Lab Results  Component Value Date   WBC 14.9 (H) 09/09/2016   HGB 12.0 09/09/2016   HCT 37.6 09/09/2016   MCV 89.5 09/09/2016   PLT 299 09/09/2016   Lab Results  Component Value Date   NA 132 (L) 09/09/2016   K 5.0 09/09/2016   CL 94 (L) 09/09/2016   CO2 29 09/09/2016    RADIOGRAPHIC STUDIES: I have personally reviewed the radiological images as listed and agreed with the findings in the report. Dg Chest 2 View  Result Date: 08/22/2016 CLINICAL DATA:  AMS and weakness of Bilateral legs per husband x about 2 weeks.Husband stated to me he believes pt has been taking too many of her prescriptions than what is prescribed.Asthma, HTN, some day smokerSx: breast biopsy -1972 EXAM: CHEST  2 VIEW COMPARISON:  08/12/2016 FINDINGS: Cardiac silhouette is mildly enlarged. No mediastinal or hilar masses. No convincing adenopathy. There are prominent bronchovascular markings bilaterally, stable. No evidence of pneumonia or pulmonary edema. No pleural effusion or pneumothorax. Skeletal structures are intact. IMPRESSION: No active cardiopulmonary  disease. Electronically Signed   By: Lajean Manes M.D.   On: 08/22/2016 13:39   Dg Chest 2 View  Result Date: 08/13/2016 CLINICAL DATA:  Hypertension, smoker, weight loss EXAM: CHEST  2 VIEW COMPARISON:  None  available FINDINGS: The lungs are hyperinflated compatible with background COPD/ emphysema. No focal pneumonia, collapse or consolidation. Negative for edema, effusion or pneumothorax. Trachea is midline. Atherosclerosis noted of the aorta. Degenerative changes of the spine diffusely. No acute compression fracture. IMPRESSION: Hyperinflation compatible with COPD/emphysema. Thoracic aortic atherosclerosis No superimposed acute process Electronically Signed   By: Jerilynn Mages.  Shick M.D.   On: 08/13/2016 08:24   Dg Thoracic Spine 2 View  Result Date: 08/13/2016 CLINICAL DATA:  Multiple falls over the past 6 months with persistent back pain, initial encounter EXAM: THORACIC SPINE 2 VIEWS COMPARISON:  None. FINDINGS: Vertebral body height is well maintained. No pedicle abnormality or paraspinal mass lesion is seen. Mild osteophytic changes are noted. The lungs are clear as visualized. IMPRESSION: Mild degenerative change without acute abnormality. Electronically Signed   By: Inez Catalina M.D.   On: 08/13/2016 08:27   Dg Lumbar Spine Complete  Result Date: 09/09/2016 CLINICAL DATA:  Patient states that she has been having falls since December 2017. Patient states that she lost her balance today but, did not fall. C/p generalized lower back pain. EXAM: LUMBAR SPINE - COMPLETE 4+ VIEW COMPARISON:  08/12/2016 FINDINGS: Moderate compression fracture of L2 is stable from the prior exam. There are no new fractures. Mild loss of disc height noted from L1-L2 through L4-L5 with moderate loss of disc height at L5-S1. There are small endplate osteophytes most evident at L1-L2 and L2-L3. Bones are demineralized. The soft tissues are unremarkable other than stable scattered aortic and iliac artery vascular calcifications.  IMPRESSION: 1. No acute fracture or acute finding. 2. Compression fracture of L2 stable from the prior study. 3. Disc degenerative changes, also stable. Electronically Signed   By: Lajean Manes M.D.   On: 09/09/2016 14:39   Dg Lumbar Spine Complete  Result Date: 08/13/2016 CLINICAL DATA:  Multiple falls over the past 6 months with chronic back pain, initial encounter EXAM: LUMBAR SPINE - COMPLETE 4+ VIEW COMPARISON:  04/26/2015 FINDINGS: Five lumbar type vertebral bodies are well visualized. Vertebral body height is well maintained with the exception of a chronic appearing L2 compression deformity similar to that noted on the prior exam. Osteophytic changes are noted at L2-3 and L1-2 and to a lesser degree at L3-4. No pars defects are noted. No acute abnormality is seen. No soft tissue changes are noted. IMPRESSION: Chronic L2 compression deformity. Multifocal degenerative change. Electronically Signed   By: Inez Catalina M.D.   On: 08/13/2016 08:26   Ct Head Wo Contrast  Result Date: 09/09/2016 CLINICAL DATA:  Altered mental status.  History falls. EXAM: CT HEAD WITHOUT CONTRAST TECHNIQUE: Contiguous axial images were obtained from the base of the skull through the vertex without intravenous contrast. COMPARISON:  CT head 08/22/2006 FINDINGS: Brain: Generalized atrophy and chronic microvascular ischemic changes. Negative for acute infarct. Negative for hemorrhage or mass. Vascular: No hyperdense vessel or unexpected calcification. Skull: Negative Sinuses/Orbits: Negative Other: None IMPRESSION: Atrophy and chronic microvascular ischemic change. No acute intracranial abnormality. Electronically Signed   By: Franchot Gallo M.D.   On: 09/09/2016 13:13   Ct Head Wo Contrast  Result Date: 08/22/2016 CLINICAL DATA:  Three day history of weakness. EXAM: CT HEAD WITHOUT CONTRAST TECHNIQUE: Contiguous axial images were obtained from the base of the skull through the vertex without intravenous contrast. COMPARISON:   None. FINDINGS: Brain: Study is mildly degraded by patient motion. No acute infarct, hemorrhage, or mass lesion is present. Mild generalized atrophy and white matter disease is  present. The ventricles are proportionate to the degree of atrophy. No significant extra-axial fluid collection is present. Vascular: Atherosclerotic calcifications are present in the cavernous internal carotid arteries and at the dural margin of the vertebral arteries. No hyperdense vessel is present. Skull: The calvarium is intact. No significant extracranial soft tissue lesions are present. Sinuses/Orbits: A polyp or mucous retention cyst is present in the right maxillary sinus with associated mucosal thickening. The paranasal sinuses and mastoid air cells are otherwise clear. IMPRESSION: 1. Mild generalized atrophy and white matter disease likely reflects the sequela of chronic microvascular ischemia. 2. No acute intracranial abnormality. 3. Atherosclerosis. 4. Right maxillary sinus disease. Electronically Signed   By: San Morelle M.D.   On: 08/22/2016 15:33   Ct Soft Tissue Neck W Contrast  Result Date: 08/22/2016 CLINICAL DATA:  Neck pain.  History of tonsil cancer. EXAM: CT NECK WITH CONTRAST TECHNIQUE: Multidetector CT imaging of the neck was performed using the standard protocol following the bolus administration of intravenous contrast. CONTRAST:  54mL ISOVUE-300 IOPAMIDOL (ISOVUE-300) INJECTION 61% COMPARISON:  None. FINDINGS: Pharynx and larynx: No focal mucosal or submucosal lesions are present. The nasopharynx, oropharynx, and hypopharynx are clear. There is some fullness at the level of the tonsils, left greater than right. Vocal cords are midline and symmetric. Salivary glands: Enhancing nodules are present at the inferior aspect of the parotid glands bilaterally. The upper parotid glands are within normal limits. The submandibular glands are normal bilaterally. Thyroid: Negative Lymph nodes: A right lateral  peripherally enhancing nodal mass that measures 2.2 x 1.8 x 2.5 cm. Just inferior and anterior is a 1.5 cm peripherally enhancing lesion. The areas of the enhancement extending to the right parotid gland. There are several smaller rounded right level 2 lymph nodes. The largest measures 13 mm. A peripherally enhancing right level 3 lymph node posteriorly measures 10 mm. A rounded right level 3 lymph node measures 10 mm. Smaller posterior right level 3 and level 4 lymph nodes are present. Multiple suspicious left-sided lymph nodes are present as well. A peripherally enhancing node or 2 nodes are present along the medial aspect of the inferior left parotid. A rounded left level 2 lymph node measures 12 mm. An ovoid posterior left level 3 lymph node measures 15 mm. A left level 4 lymph node measures 12 mm. Right peripherally enhancing left supraclavicular node measures 2.01.7 cm on image 75 of series 6. There are 2 posterior triangle lymph nodes on the left measuring 15 and 19 mm respectively. Vascular: Atherosclerotic calcifications are present at the carotid bifurcations bilaterally. There is potential stenosis bilaterally. Limited intracranial: Limited imaging the brain is unremarkable. Atherosclerotic calcifications are present. Visualized orbits: A right lens replacement is present. The globes and orbits are otherwise within normal limits. Mastoids and visualized paranasal sinuses: A large polyp or mucous retention cyst is noted inferiorly in the right maxillary sinus. There is some mucosal thickening associated. The paranasal sinuses and mastoid air cells are otherwise clear. Skeleton: Degenerative endplate changes are most severe at C5-6 and C6-7. Uncovertebral spurring results in right greater than left foraminal narrowing. Left greater than right foraminal narrowing is present at C4-5 due to facet spurring. Upper chest: Dependent atelectasis is present at the lung apices bilaterally. Atherosclerotic  calcifications are present at the arch. The upper mediastinum is otherwise within normal limits. IMPRESSION: 1. Extensive bilateral cervical adenopathy concerning metastatic disease. Lymphoproliferative disease is considered less likely. 2. Mild fullness in the tonsillar area bilaterally may reflect prior treatment  versus recurrent disease. 3. Bilateral atherosclerosis. 4. Right maxillary sinus disease. Electronically Signed   By: San Morelle M.D.   On: 08/22/2016 15:28   Mr Brain W And Wo Contrast  Result Date: 08/22/2016 CLINICAL DATA:  Altered mental status, recent falls. General weakness. EXAM: MRI HEAD WITHOUT AND WITH CONTRAST TECHNIQUE: Multiplanar, multiecho pulse sequences of the brain and surrounding structures were obtained without and with intravenous contrast. CONTRAST:  62mL MULTIHANCE GADOBENATE DIMEGLUMINE 529 MG/ML IV SOLN COMPARISON:  CT neck earlier today. FINDINGS: Significant motion degradation. Study is of marginal diagnostic utility. Some type of susceptibility artifact at the skull bases, leads to artifactual distortion of much of the scan. Brain: No evidence for acute infarction, hemorrhage, mass lesion, hydrocephalus, or extra-axial fluid. Global atrophy. Chronic microvascular ischemic change. Post infusion, no abnormal enhancement of the brain or meninges. Vascular: Normal flow voids. Skull and upper cervical spine: Normal marrow signal. Sinuses/Orbits: Near complete opacification RIGHT maxillary sinus due to a polyp or retention cyst. No layering fluid. Other: Metastatic adenopathy better visualized on recent CT. Chronic RIGHT mastoid fluid. IMPRESSION: Scan is of marginal diagnostic utility, from the standpoint of patient motion as well as some type of susceptibility artifact which obscures much of the lower slices. No visible restricted diffusion. No definite abnormal postcontrast enhancement. Atrophy and small vessel disease. Electronically Signed   By: Staci Righter M.D.    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 I consulted the hospitalist. Overall, this patient need urgent workup and evaluation in the hospital. She is not ready to be discharged until her workup is complete. It is very likely she may need treatment to be started while hospitalized. I recommend aggressive IV fluid resuscitation and steroids for malignant hypercalcemia I recommend holding off IV bisphosphonates due to recent renal failure and significant poor dentition  I have alerted the head and neck nurse Navigator total coordinate appointments He will alert the dental surgeon and radiation oncologist about the patient's hospital admission Once her renal function improves, I will order CT scan of the chest, abdomen and pelvis to complete her staging Her recent x-ray suggested compression fracture involving her lumbar spine. If CT scan showed areas of abnormality in that region, she may benefit from MRI to exclude metastatic cancer to her bone I recommend speech and language therapist evaluation once her mental status improved for formal swallow evaluation of her dysphagia She will benefit dietitian involvement for nutritional supplement  I will follow the patient while hospitalized  I spent 55 minutes counseling the patient face to face. The total time spent in the appointment was 80 minutes and more than 50% was on counseling.     Heath Lark, MD 09/10/16  8:27 AM

## 2016-09-10 NOTE — Progress Notes (Signed)
Patient has voided x2, 100cc in total since admit. Bladder scan completed, > 999. Provider on call paged with update. Awaiting orders

## 2016-09-10 NOTE — ED Notes (Signed)
MD from cancer center at bedside for eval.

## 2016-09-10 NOTE — Progress Notes (Signed)
PRE-OPERATIVE NOTE:  09/10/2016 Desiree Higgins [id number]  VITALS: BP 173/85 (BP Location: Left Arm)   Pulse 98   Temp 98.1 F (36.7 C) (Oral)   Resp 16   SpO2 98%   Lab Results  Component Value Date   WBC 14.9 (H) 09/09/2016   HGB 12.0 09/09/2016   HCT 37.6 09/09/2016   MCV 89.5 09/09/2016   PLT 299 09/09/2016   BMET    Component Value Date/Time   NA 132 (L) 09/09/2016 1808   K 5.0 09/09/2016 1808   CL 94 (L) 09/09/2016 1808   CO2 29 09/09/2016 1808   GLUCOSE 102 (H) 09/09/2016 1808   BUN 27 (H) 09/09/2016 1808   CREATININE 1.42 (H) 09/09/2016 1808   CALCIUM 12.9 (H) 09/09/2016 1808   GFRNONAA 36 (L) 09/09/2016 1808   GFRAA 42 (L) 09/09/2016 1808    No results found for: INR, PROTIME No results found for: PTT   Desiree Higgins is in the process of being admitted to [facility]. Patient currently is in [facility] in the ED.  The patient was recently scheduled for operating procedure for Thursday as an outpatient, but has been now scheduled for inpatient surgery on Monday, 09/13/2016 at approximately 10 AM. I discussed the risks, benefits, complications of the proposed dental treatment. Patient agrees to proceed with treatment as planned this coming Monday in the operating room with general anesthesia. Dr.  Alvy Bimler has been contacted and agrees with the plan of care.   SUBJECTIVE: The patient denies any dental changes and agrees to proceed with treatment as planned.  EXAM: No sign of acute dental changes.  ASSESSMENT: Patient is affected by chronic apical periodontitis, dental caries, retained root segments, chronic periodontitis, and loose teeth.  PLAN: Patient agrees to proceed with treatment as planned in the operating room as previously discussed and accepts the risks, benefits, and complications of the proposed treatment. Patient is aware of the risk for bleeding, bruising, swelling, infection, pain, nerve damage, tissue damage, root tip fracture, mandible  fracture, and the risks of complications associated with the anesthesia. Patient also is aware of the potential for other complications not mentioned above.   [name], DDS

## 2016-09-11 DIAGNOSIS — I10 Essential (primary) hypertension: Secondary | ICD-10-CM

## 2016-09-11 DIAGNOSIS — N179 Acute kidney failure, unspecified: Secondary | ICD-10-CM

## 2016-09-11 DIAGNOSIS — R339 Retention of urine, unspecified: Secondary | ICD-10-CM

## 2016-09-11 DIAGNOSIS — G9341 Metabolic encephalopathy: Secondary | ICD-10-CM

## 2016-09-11 LAB — COMPREHENSIVE METABOLIC PANEL
ALK PHOS: 84 U/L (ref 38–126)
ALT: 16 U/L (ref 14–54)
AST: 18 U/L (ref 15–41)
Albumin: 3.1 g/dL — ABNORMAL LOW (ref 3.5–5.0)
Anion gap: 9 (ref 5–15)
BUN: 21 mg/dL — AB (ref 6–20)
CALCIUM: 11.7 mg/dL — AB (ref 8.9–10.3)
CHLORIDE: 97 mmol/L — AB (ref 101–111)
CO2: 28 mmol/L (ref 22–32)
CREATININE: 1.25 mg/dL — AB (ref 0.44–1.00)
GFR, EST AFRICAN AMERICAN: 49 mL/min — AB (ref >60)
GFR, EST NON AFRICAN AMERICAN: 42 mL/min — AB (ref >60)
Glucose, Bld: 102 mg/dL — ABNORMAL HIGH (ref 65–99)
Potassium: 3.4 mmol/L — ABNORMAL LOW (ref 3.5–5.1)
SODIUM: 134 mmol/L — AB (ref 135–145)
Total Bilirubin: 0.7 mg/dL (ref 0.3–1.2)
Total Protein: 7 g/dL (ref 6.5–8.1)

## 2016-09-11 LAB — CBC
HCT: 36.1 % (ref 36.0–46.0)
HEMOGLOBIN: 11.4 g/dL — AB (ref 12.0–15.0)
MCH: 28.8 pg (ref 26.0–34.0)
MCHC: 31.6 g/dL (ref 30.0–36.0)
MCV: 91.2 fL (ref 78.0–100.0)
PLATELETS: 314 10*3/uL (ref 150–400)
RBC: 3.96 MIL/uL (ref 3.87–5.11)
RDW: 14.8 % (ref 11.5–15.5)
WBC: 16.9 10*3/uL — AB (ref 4.0–10.5)

## 2016-09-11 LAB — HEPATIC FUNCTION PANEL
ALBUMIN: 3.2 g/dL — AB (ref 3.5–5.0)
ALK PHOS: 85 U/L (ref 38–126)
ALT: 18 U/L (ref 14–54)
AST: 18 U/L (ref 15–41)
BILIRUBIN INDIRECT: 0.6 mg/dL (ref 0.3–0.9)
BILIRUBIN TOTAL: 0.8 mg/dL (ref 0.3–1.2)
Bilirubin, Direct: 0.2 mg/dL (ref 0.1–0.5)
Total Protein: 7.4 g/dL (ref 6.5–8.1)

## 2016-09-11 MED ORDER — SODIUM CHLORIDE 0.9 % IV SOLN
INTRAVENOUS | Status: AC
  Administered 2016-09-11 (×3): via INTRAVENOUS

## 2016-09-11 MED ORDER — DEXAMETHASONE SODIUM PHOSPHATE 10 MG/ML IJ SOLN
20.0000 mg | Freq: Every day | INTRAMUSCULAR | Status: AC
  Administered 2016-09-11 – 2016-09-13 (×3): 20 mg via INTRAVENOUS
  Filled 2016-09-11: qty 5
  Filled 2016-09-11 (×2): qty 2
  Filled 2016-09-11: qty 5

## 2016-09-11 MED ORDER — POLYETHYLENE GLYCOL 3350 17 G PO PACK
17.0000 g | PACK | Freq: Two times a day (BID) | ORAL | Status: DC
  Administered 2016-09-11 (×2): 17 g via ORAL
  Filled 2016-09-11 (×2): qty 1

## 2016-09-11 NOTE — Progress Notes (Signed)
[facility] PROGRESS NOTE    Progress Note  Jakhia Ponting  [id number] DOB: [birth year] DOA: 09/09/2016 PCP: [name], MD     Brief Narrative:   Desiree Higgins is an 71 y.o. female past history of essential hypertension depression with a recent hospitalization diagnosed with tonsillar cancer comes in with altered mental status, fatigue and multiple falls in the ED basic metabolic panel show mild hypercalcemia, mild acute renal failure.  Assessment/Plan:   Metabolic encephalopathy due to hypercalcemia of malignancy: Start on IV fluid hydration, steroids and calcitonin.Metabolic encephalopathy has resolved. Biphosphonate's were held due to renal failure and significant poor dentition. Minimal improvement in calcium, will increase IV fluid hydration recheck a basic metabolic panel in the morning. She is not fluid overloaded on physical exam.  Acute kidney injury: Baseline creatinine <1, due to hypercalcemia. I will go ahead and increase IV fluid hydration hold nephrotoxic drugs and recheck a basic metabolic panel in the morning.  Failure to thrive: Multifactorial likely due to hypercalcemia malignancy and probably hypovolemia. Physical therapy evaluation, likely need skilled nursing facility.  Poor dentition: Dr. Thomasene Lot to see for extraction on Monday at 9:30.  Hyponatremia: Multifactorial likely due to poor oral intake and hydrochlorothiazide. Continue all data, thiazide increase IV fluid.  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  Hyperbilirubinemia: Likely due to dehydration and decreased oral intake recheck LFTs   DVT prophylaxis: lovenox Family Communication:none Disposition Plan/Barrier to D/C: home in 2 days once Cr and Calcium improved Code Status:     Code Status Orders        Start     Ordered   09/10/16 1511  Full code  Continuous     09/10/16 1510    Code Status History    Date Active Date Inactive Code  Status Order ID Comments User Context   08/22/2016 10:01 PM 08/25/2016 10:02 PM Full Code [id number]  Barton Dubois, MD Inpatient        IV Access:    Peripheral IV   Procedures and diagnostic studies:   Dg Lumbar Spine Complete  Result Date: 09/09/2016 CLINICAL DATA:  Patient states that she has been having falls since December 2017. Patient states that she lost her balance today but, did not fall. C/p generalized lower back pain. EXAM: LUMBAR SPINE - COMPLETE 4+ VIEW COMPARISON:  08/12/2016 FINDINGS: Moderate compression fracture of L2 is stable from the prior exam. There are no new fractures. Mild loss of disc height noted from L1-L2 through L4-L5 with moderate loss of disc height at L5-S1. There are small endplate osteophytes most evident at L1-L2 and L2-L3. Bones are demineralized. The soft tissues are unremarkable other than stable scattered aortic and iliac artery vascular calcifications. IMPRESSION: 1. No acute fracture or acute finding. 2. Compression fracture of L2 stable from the prior study. 3. Disc degenerative changes, also stable. Electronically Signed   By: Lajean Manes M.D.   On: 09/09/2016 14:39   Ct Head Wo Contrast  Result Date: 09/09/2016 CLINICAL DATA:  Altered mental status.  History falls. EXAM: CT HEAD WITHOUT CONTRAST TECHNIQUE: Contiguous axial images were obtained from the base of the skull through the vertex without intravenous contrast. COMPARISON:  CT head 08/22/2006 FINDINGS: Brain: Generalized atrophy and chronic microvascular ischemic changes. Negative for acute infarct. Negative for hemorrhage or mass. Vascular: No hyperdense vessel or unexpected calcification. Skull: Negative Sinuses/Orbits: Negative Other: None IMPRESSION: Atrophy and chronic microvascular ischemic change. No acute intracranial abnormality. Electronically Signed  By: Franchot Gallo M.D.   On: 09/09/2016 13:13     Medical Consultants:    None.  Anti-Infectives:   None  Subjective:     Beverly Gust she she feels about the same complaining a lot about the bed.  Objective:    Vitals:   09/10/16 1404 09/10/16 1954 09/10/16 2311 09/11/16 0333  BP:   (!) 149/63 135/66  Pulse:   85 87  Resp:   16 18  Temp:   97.8 F (36.6 C) 98 F (36.7 C)  TempSrc:   Oral Oral  SpO2:   94% 93%  Weight: 86.3 kg (190 lb 4.1 oz)     Height:  5\' 9"  (1.753 m)      Intake/Output Summary (Last 24 hours) at 09/11/16 0801 Last data filed at 09/11/16 0600  Gross per 24 hour  Intake          1842.08 ml  Output             1500 ml  Net           342.08 ml   Filed Weights   09/10/16 1404  Weight: 86.3 kg (190 lb 4.1 oz)    Exam: General exam: In no acute distress. Respiratory system: Good air movement and clear to auscultation. Cardiovascular system: S1 & S2 heard, RRR. No JVD. Gastrointestinal system: Abdomen is nondistended, soft and nontender.  Extremities: No pedal edema. Skin: No rashes, lesions or ulcers Psychiatry: Judgement and insight appear normal. Mood & affect appropriate.    Data Reviewed:    Labs: Basic Metabolic Panel:  Recent Labs Lab 09/09/16 1808 09/10/16 1525 09/10/16 1806  NA 132*  --  133*  K 5.0  --  3.1*  CL 94*  --  96*  CO2 29  --  29  GLUCOSE 102*  --  138*  BUN 27*  --  24*  CREATININE 1.42* 1.39* 1.40*  CALCIUM 12.9*  --  12.5*   GFR Estimated Creatinine Clearance: 42.5 mL/min (by C-G formula based on SCr of 1.4 mg/dL (H)). Liver Function Tests:  Recent Labs Lab 09/09/16 1808  AST 33  ALT 14  ALKPHOS 93  BILITOT 2.0*  PROT 7.4  ALBUMIN 3.3*   No results for input(s): LIPASE, AMYLASE in the last 168 hours. No results for input(s): AMMONIA in the last 168 hours. Coagulation profile No results for input(s): INR, PROTIME in the last 168 hours.  CBC:  Recent Labs Lab 09/09/16 1326 09/10/16 1525 09/11/16 0639  WBC 14.9* 18.8* 16.9*  NEUTROABS 9.8*  --   --   HGB 12.0 11.8* 11.4*  HCT 37.6 36.1 36.1  MCV 89.5 89.6  91.2  PLT 299 325 314   Cardiac Enzymes: No results for input(s): CKTOTAL, CKMB, CKMBINDEX, TROPONINI in the last 168 hours. BNP (last 3 results) No results for input(s): PROBNP in the last 8760 hours. CBG: No results for input(s): GLUCAP in the last 168 hours. D-Dimer: No results for input(s): DDIMER in the last 72 hours. Hgb A1c: No results for input(s): HGBA1C in the last 72 hours. Lipid Profile: No results for input(s): CHOL, HDL, LDLCALC, TRIG, CHOLHDL, LDLDIRECT in the last 72 hours. Thyroid function studies: No results for input(s): TSH, T4TOTAL, T3FREE, THYROIDAB in the last 72 hours.  Invalid input(s): FREET3 Anemia work up: No results for input(s): VITAMINB12, FOLATE, FERRITIN, TIBC, IRON, RETICCTPCT in the last 72 hours. Sepsis Labs:  Recent Labs Lab 09/09/16 1326 09/09/16 1340 09/10/16 1525 09/11/16 [id number]  WBC 14.9*  --  18.8* 16.9*  LATICACIDVEN  --  1.45  --   --    Microbiology No results found for this or any previous visit (from the past 240 hour(s)).   Medications:   . amLODipine  5 mg Oral Daily  . aspirin EC  81 mg Oral Daily  . calcitonin  100 Units Subcutaneous Q12H  . DULoxetine  60 mg Oral Daily  . feeding supplement (ENSURE ENLIVE)  237 mL Oral BID BM  . heparin  5,000 Units Subcutaneous Q8H  . losartan  100 mg Oral Daily  . montelukast  10 mg Oral Q breakfast  . polyethylene glycol  17 g Oral BID  . senna  1 tablet Oral BID  . simvastatin  40 mg Oral Daily   Continuous Infusions: . sodium chloride 75 mL/hr at 09/11/16 0510    Time spent: 25 min   LOS: 1 day   Charlynne Cousins  [facility] Pager [phone]  *Please refer to [email or web address], password [email or web address] to get updated schedule on who will round on this patient, as hospitalists switch teams weekly. If 7PM-7AM, please contact night-coverage at [email or web address], password [email or web address] for any overnight needs.  09/11/2016, 8:01 AM

## 2016-09-11 NOTE — Progress Notes (Signed)
Tap water enema given at this time after Miralax had no effect. Pt tolerated well- passed a moderate amount of tan, gritty, liquid stool. No formed stool. Will make MD aware of results.

## 2016-09-11 NOTE — Progress Notes (Signed)
Pt's husband in room and prefers pt not to have enema at this time. Per husband, pt had "a small BM yesterday." He has concern that pt doe snot need enema d/t poor PO intake recently. Miralax given- will "start with this" per husband's preference. Husband also not ready to sign pt's surgical consent from at this time. Prefers to speak with MD before signing.

## 2016-09-11 NOTE — Plan of Care (Signed)
Problem: Physical Regulation: Goal: Ability to maintain clinical measurements within normal limits will improve Outcome: Progressing Ca level decreasing, along with Cr level.  Problem: Nutrition: Goal: Adequate nutrition will be maintained Outcome: Progressing Pt c/o decreased appetite- Clear liquids encouraged

## 2016-09-11 NOTE — Clinical Social Work Note (Signed)
Pt will undergo dental extractions on Monday 09/13/16.  SNF bed offers in place and awaiting stability per MD.  CSW services continue to monitor for stability.  Lorie Phenix. Pauline Good, [place] (weekend coverage)

## 2016-09-11 NOTE — Progress Notes (Signed)
Desiree Higgins   DOB:10-07-1945   [id number]    Subjective: She appears more alert today. Denies pain. Remained constipated but she could not remember for how long. Noted urinary retention, Foley catheter placed  Objective:  Vitals:   09/10/16 2311 09/11/16 0333  BP: (!) 149/63 135/66  Pulse: 85 87  Resp: 16 18  Temp: 97.8 F (36.6 C) 98 F (36.7 C)     Intake/Output Summary (Last 24 hours) at 09/11/16 0950 Last data filed at 09/11/16 0600  Gross per 24 hour  Intake          1842.08 ml  Output             1500 ml  Net           342.08 ml    GENERAL:alert, no distress and comfortable SKIN: skin color, texture, turgor are normal, no rashes or significant lesions EYES: normal, Conjunctiva are pink and non-injected, sclera clear OROPHARYNX:no exudate, no erythema and lips, buccal mucosa, and tongue normal  NECK: supple, thyroid normal size, non-tender, without nodularity LYMPH:  Persistent palpable lymphadenopathy LUNGS: clear to auscultation and percussion with normal breathing effort HEART: regular rate & rhythm and no murmurs and no lower extremity edema ABDOMEN:abdomen soft, non-tender and normal bowel sounds Musculoskeletal:no cyanosis of digits and no clubbing  NEURO: alert & oriented x 3 with fluent speech, no focal motor/sensory deficits. Still somewhat somnolent   Labs:  Lab Results  Component Value Date   WBC 16.9 (H) 09/11/2016   HGB 11.4 (L) 09/11/2016   HCT 36.1 09/11/2016   MCV 91.2 09/11/2016   PLT 314 09/11/2016   NEUTROABS 9.8 (H) 09/09/2016    Lab Results  Component Value Date   NA 134 (L) 09/11/2016   K 3.4 (L) 09/11/2016   CL 97 (L) 09/11/2016   CO2 28 09/11/2016    Studies:  Dg Lumbar Spine Complete  Result Date: 09/09/2016 CLINICAL DATA:  Patient states that she has been having falls since December 2017. Patient states that she lost her balance today but, did not fall. C/p generalized lower back pain. EXAM: LUMBAR SPINE - COMPLETE 4+ VIEW  COMPARISON:  08/12/2016 FINDINGS: Moderate compression fracture of L2 is stable from the prior exam. There are no new fractures. Mild loss of disc height noted from L1-L2 through L4-L5 with moderate loss of disc height at L5-S1. There are small endplate osteophytes most evident at L1-L2 and L2-L3. Bones are demineralized. The soft tissues are unremarkable other than stable scattered aortic and iliac artery vascular calcifications. IMPRESSION: 1. No acute fracture or acute finding. 2. Compression fracture of L2 stable from the prior study. 3. Disc degenerative changes, also stable. Electronically Signed   By: Lajean Manes M.D.   On: 09/09/2016 14:39   Ct Head Wo Contrast  Result Date: 09/09/2016 CLINICAL DATA:  Altered mental status.  History falls. EXAM: CT HEAD WITHOUT CONTRAST TECHNIQUE: Contiguous axial images were obtained from the base of the skull through the vertex without intravenous contrast. COMPARISON:  CT head 08/22/2006 FINDINGS: Brain: Generalized atrophy and chronic microvascular ischemic changes. Negative for acute infarct. Negative for hemorrhage or mass. Vascular: No hyperdense vessel or unexpected calcification. Skull: Negative Sinuses/Orbits: Negative Other: None IMPRESSION: Atrophy and chronic microvascular ischemic change. No acute intracranial abnormality. Electronically Signed   By: Franchot Gallo M.D.   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 Ideally she would benefit from PET  scan as outpatient to complete staging I will order CT chest preferably with contrast once creatinine function continues to improve Dental extraction next week while hospitalized  Malignant hypercalcemia Mildly improved since admission Continue aggressive IVF hydration plus forced diuresis I will add 3 days high dose dexamethasone  Acute renal failure due to dehydration and malignant hypercalcemia Continue IVF  Altered mental  status due to malignant hypercalcemia Slightly improved  Urinary retention and severe constipation Likely due to hypercalcemia Foley is placed. Continue aggressive laxatives  Profound weakness, recent fall, poor social circumstances Consult social worker and PT once she is better Unlikely able to tolerate outpatient therapy  Significant weight loss & dysphagia Consult nutrition and Speech therapy for formal evaluation once she is more alert  Nicotine dependency Consider nicotine patch  Discharge planning  Not ready due to multiple unresolved issues Will continue to follow  Heath Lark, MD 09/11/2016  9:50 AM

## 2016-09-12 ENCOUNTER — Inpatient Hospital Stay (HOSPITAL_COMMUNITY): Payer: Medicare Other

## 2016-09-12 ENCOUNTER — Encounter (HOSPITAL_COMMUNITY): Payer: Self-pay | Admitting: Radiology

## 2016-09-12 ENCOUNTER — Encounter (HOSPITAL_COMMUNITY): Payer: Self-pay | Admitting: Certified Registered Nurse Anesthetist

## 2016-09-12 DIAGNOSIS — D72829 Elevated white blood cell count, unspecified: Secondary | ICD-10-CM

## 2016-09-12 DIAGNOSIS — E876 Hypokalemia: Secondary | ICD-10-CM

## 2016-09-12 DIAGNOSIS — N183 Chronic kidney disease, stage 3 (moderate): Secondary | ICD-10-CM

## 2016-09-12 LAB — COMPREHENSIVE METABOLIC PANEL
ALK PHOS: 75 U/L (ref 38–126)
ALT: 17 U/L (ref 14–54)
AST: 15 U/L (ref 15–41)
Albumin: 2.8 g/dL — ABNORMAL LOW (ref 3.5–5.0)
Anion gap: 9 (ref 5–15)
BUN: 21 mg/dL — AB (ref 6–20)
CO2: 23 mmol/L (ref 22–32)
CREATININE: 1.16 mg/dL — AB (ref 0.44–1.00)
Calcium: 10.8 mg/dL — ABNORMAL HIGH (ref 8.9–10.3)
Chloride: 102 mmol/L (ref 101–111)
GFR calc non Af Amer: 46 mL/min — ABNORMAL LOW (ref >60)
GFR, EST AFRICAN AMERICAN: 53 mL/min — AB (ref >60)
Glucose, Bld: 100 mg/dL — ABNORMAL HIGH (ref 65–99)
Potassium: 3.2 mmol/L — ABNORMAL LOW (ref 3.5–5.1)
SODIUM: 134 mmol/L — AB (ref 135–145)
Total Bilirubin: 0.6 mg/dL (ref 0.3–1.2)
Total Protein: 6.8 g/dL (ref 6.5–8.1)

## 2016-09-12 LAB — CBC
HCT: 32.3 % — ABNORMAL LOW (ref 36.0–46.0)
Hemoglobin: 10.5 g/dL — ABNORMAL LOW (ref 12.0–15.0)
MCH: 28.6 pg (ref 26.0–34.0)
MCHC: 32.5 g/dL (ref 30.0–36.0)
MCV: 88 fL (ref 78.0–100.0)
Platelets: 299 10*3/uL (ref 150–400)
RBC: 3.67 MIL/uL — AB (ref 3.87–5.11)
RDW: 14.5 % (ref 11.5–15.5)
WBC: 16.4 10*3/uL — ABNORMAL HIGH (ref 4.0–10.5)

## 2016-09-12 MED ORDER — IOPAMIDOL (ISOVUE-300) INJECTION 61%
30.0000 mL | Freq: Once | INTRAVENOUS | Status: AC | PRN
  Administered 2016-09-12: 30 mL via INTRAVENOUS

## 2016-09-12 MED ORDER — POTASSIUM CHLORIDE CRYS ER 20 MEQ PO TBCR
40.0000 meq | EXTENDED_RELEASE_TABLET | Freq: Three times a day (TID) | ORAL | Status: AC
  Administered 2016-09-12 (×3): 40 meq via ORAL
  Filled 2016-09-12 (×3): qty 2

## 2016-09-12 MED ORDER — SODIUM CHLORIDE 0.9 % IJ SOLN
INTRAMUSCULAR | Status: AC
  Filled 2016-09-12: qty 50

## 2016-09-12 MED ORDER — AMLODIPINE BESYLATE 10 MG PO TABS
10.0000 mg | ORAL_TABLET | Freq: Every day | ORAL | Status: DC
  Administered 2016-09-12 – 2016-09-14 (×3): 10 mg via ORAL
  Filled 2016-09-12 (×3): qty 1

## 2016-09-12 MED ORDER — FUROSEMIDE 20 MG PO TABS
20.0000 mg | ORAL_TABLET | Freq: Two times a day (BID) | ORAL | Status: AC
  Administered 2016-09-12 – 2016-09-13 (×4): 20 mg via ORAL
  Filled 2016-09-12 (×4): qty 1

## 2016-09-12 MED ORDER — HYDRALAZINE HCL 20 MG/ML IJ SOLN: 10.0000 mg | Freq: Four times a day (QID) | INTRAMUSCULAR | Status: DC | PRN

## 2016-09-12 MED ORDER — IOPAMIDOL (ISOVUE-300) INJECTION 61%
INTRAVENOUS | Status: AC
  Administered 2016-09-12: 100 mL
  Filled 2016-09-12: qty 100

## 2016-09-12 MED ORDER — IOPAMIDOL (ISOVUE-300) INJECTION 61%
INTRAVENOUS | Status: AC
  Filled 2016-09-12: qty 100

## 2016-09-12 MED ORDER — POLYETHYLENE GLYCOL 3350 17 G PO PACK
17.0000 g | PACK | Freq: Three times a day (TID) | ORAL | Status: DC
  Administered 2016-09-12 – 2016-09-13 (×4): 17 g via ORAL
  Filled 2016-09-12 (×4): qty 1

## 2016-09-12 NOTE — Progress Notes (Signed)
Desiree Higgins   DOB:08-08-1946   [id number]    Subjective: The patient feels a little better. She has a small bowel movement. She has good urine output with aggressive IV fluid hydration with improvement of hypercalcemia.  Objective:  Vitals:   09/11/16 2148 09/12/16 0534  BP: 134/69 (!) 146/69  Pulse: 86 87  Resp: 18 18  Temp: 97.4 F (36.3 C) 98.6 F (37 C)     Intake/Output Summary (Last 24 hours) at 09/12/16 [id number] Last data filed at 09/12/16 0600  Gross per 24 hour  Intake          3006.25 ml  Output             1575 ml  Net          1431.25 ml    GENERAL:alert, no distress and comfortable SKIN: skin color, texture, turgor are normal, no rashes or significant lesions EYES: normal, Conjunctiva are pink and non-injected, sclera clear OROPHARYNX:no exudate, no erythema and lips, buccal mucosa, and tongue normal . Poor dentition NECK: supple, thyroid normal size, non-tender, without nodularity LYMPH:  She has palpable lymphadenopathy on her neck on exam LUNGS: clear to auscultation and percussion with normal breathing effort HEART: regular rate & rhythm and no murmurs and no lower extremity edema ABDOMEN:abdomen soft, non-tender and normal bowel sounds Musculoskeletal:no cyanosis of digits and no clubbing  NEURO: alert & oriented x 3 with fluent speech, no focal motor/sensory deficits   Labs:  Lab Results  Component Value Date   WBC 16.4 (H) 09/12/2016   HGB 10.5 (L) 09/12/2016   HCT 32.3 (L) 09/12/2016   MCV 88.0 09/12/2016   PLT 299 09/12/2016   NEUTROABS 9.8 (H) 09/09/2016    Lab Results  Component Value Date   NA 134 (L) 09/12/2016   K 3.2 (L) 09/12/2016   CL 102 09/12/2016   CO2 23 09/12/2016    Assessment & Plan:   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 Ideally she would benefit from PET scan as outpatient to complete staging I will order CT chest, abdomen and pelvis today with contrast since her creatinine function continues to  improve Dental extraction next week while hospitalized  Malignant hypercalcemia Mildly improved since admission Continue aggressive IVF hydration plus forced diuresis with lasix I have added 3 days high dose dexamethasone, started 09/11/16  Acute renal failure due to dehydration and malignant hypercalcemia Continue IVF Avoid Losartan, switch to Amlodipine, lasix and hydralazine prn  Altered mental status due to malignant hypercalcemia Slightly improved  Hypokalemia Due to diuresis Replace prn  Leukocytosis Due to malignancy and steroids therapy Observe  Urinary retention and severe constipation Likely due to hypercalcemia Foley is placed. Continue aggressive laxatives  Profound weakness, recent fall, poor social circumstances Consult social worker and PT once she is better Unlikely able to tolerate outpatient therapy  Significant weight loss & dysphagia Consult nutrition and Speech therapy for formal evaluation once she is more alert  Nicotine dependency Consider nicotine patch  Discharge planning  Not ready due to multiple unresolved issues Will continue to follow   Heath Lark, MD 09/12/2016  9:06 AM

## 2016-09-12 NOTE — Progress Notes (Signed)
[facility] PROGRESS NOTE    Progress Note  Desiree Higgins  [id number] DOB: 01/05/1946 DOA: 09/09/2016 PCP: [name], MD     Brief Narrative:   Desiree Higgins is an 71 y.o. female past history of essential hypertension depression with a recent hospitalization diagnosed with tonsillar cancer comes in with altered mental status, fatigue and multiple falls in the ED basic metabolic panel show mild hypercalcemia, mild acute renal failure.  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 on IV fluid hydration, steroids and calcitonin. Metabolic encephalopathy has resolved. Improvement in calcium, KVO  fluid hydration recheck a basic metabolic panel in the morning. She is not fluid overloaded on physical exam.  Acute kidney injury: Baseline creatinine <1, due to hypercalcemia. Cont. IV fluid hydration and recheck a basic metabolic panel in the morning.  Failure to thrive: Multifactorial likely due to hypercalcemia malignancy and probably hypovolemia. Physical therapy evaluation, likely need skilled nursing facility.  Poor dentition: Dr. Idolina Primer has been notified, can still proceed with tooth extraction on Monday at 9:30.  Hyponatremia: Multifactorial likely due to poor oral intake and hydrochlorothiazide. Continue to hold thiazide.  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  Hyperbilirubinemia: Likely due to dehydration and decreased oral intake recheck LFTs. Now resolved.   DVT prophylaxis: lovenox Family Communication:none Disposition Plan/Barrier to D/C: home in 1 days once Cr and Calcium improved Code Status:     Code Status Orders        Start     Ordered   09/10/16 1511  Full code  Continuous     09/10/16 1510    Code Status History    Date Active Date Inactive Code Status Order ID Comments User Context   08/22/2016 10:01 PM 08/25/2016 10:02 PM Full Code [id number]  Barton Dubois, MD Inpatient        IV Access:    Peripheral IV   Procedures and diagnostic studies:   No results found.   Medical Consultants:    None.  Anti-Infectives:   None  Subjective:    Desiree Higgins she she feels better wants to go home.  Objective:    Vitals:   09/11/16 0333 09/11/16 1353 09/11/16 2148 09/12/16 0534  BP: 135/66 (!) 150/74 134/69 (!) 146/69  Pulse: 87 98 86 87  Resp: 18 18 18 18   Temp: 98 F (36.7 C) 99.1 F (37.3 C) 97.4 F (36.3 C) 98.6 F (37 C)  TempSrc: Oral Oral Oral Oral  SpO2: 93% 93% 95% 96%  Weight:      Height:        Intake/Output Summary (Last 24 hours) at 09/12/16 0841 Last data filed at 09/12/16 0600  Gross per 24 hour  Intake          3246.25 ml  Output             1575 ml  Net          1671.25 ml   Filed Weights   09/10/16 1404  Weight: 86.3 kg (190 lb 4.1 oz)    Exam: General exam: In no acute distress. Respiratory system: Good air movement and clear to auscultation. Cardiovascular system: S1 & S2 heard, RRR. No JVD. Gastrointestinal system: Abdomen is nondistended, soft and nontender.  Extremities: No pedal edema. Skin: No rashes, lesions or ulcers Psychiatry: Judgement and insight appear normal. Mood & affect appropriate.    Data Reviewed:    Labs: Basic Metabolic Panel:  Recent Labs Lab 09/09/16 1808 09/10/16 1525 09/10/16 1806 09/11/16 0639 09/12/16 0541  NA 132*  --  133* 134* 134*  K 5.0  --  3.1* 3.4* 3.2*  CL 94*  --  96* 97* 102  CO2 29  --  29 28 23   GLUCOSE 102*  --  138* 102* 100*  BUN 27*  --  24* 21* 21*  CREATININE 1.42* 1.39* 1.40* 1.25* 1.16*  CALCIUM 12.9*  --  12.5* 11.7* 10.8*   GFR Estimated Creatinine Clearance: 51.4 mL/min (by C-G formula based on SCr of 1.16 mg/dL (H)). Liver Function Tests:  Recent Labs Lab 09/09/16 1808 09/11/16 0639 09/11/16 0816 09/12/16 0541  AST 33 18 18 15   ALT 14 16 18 17   ALKPHOS 93 84 85 75  BILITOT 2.0* 0.7 0.8 0.6  PROT 7.4  7.0 7.4 6.8  ALBUMIN 3.3* 3.1* 3.2* 2.8*   No results for input(s): LIPASE, AMYLASE in the last 168 hours. No results for input(s): AMMONIA in the last 168 hours. Coagulation profile No results for input(s): INR, PROTIME in the last 168 hours.  CBC:  Recent Labs Lab 09/09/16 1326 09/10/16 1525 09/11/16 0639 09/12/16 0541  WBC 14.9* 18.8* 16.9* 16.4*  NEUTROABS 9.8*  --   --   --   HGB 12.0 11.8* 11.4* 10.5*  HCT 37.6 36.1 36.1 32.3*  MCV 89.5 89.6 91.2 88.0  PLT 299 325 314 299   Cardiac Enzymes: No results for input(s): CKTOTAL, CKMB, CKMBINDEX, TROPONINI in the last 168 hours. BNP (last 3 results) No results for input(s): PROBNP in the last 8760 hours. CBG: No results for input(s): GLUCAP in the last 168 hours. D-Dimer: No results for input(s): DDIMER in the last 72 hours. Hgb A1c: No results for input(s): HGBA1C in the last 72 hours. Lipid Profile: No results for input(s): CHOL, HDL, LDLCALC, TRIG, CHOLHDL, LDLDIRECT in the last 72 hours. Thyroid function studies: No results for input(s): TSH, T4TOTAL, T3FREE, THYROIDAB in the last 72 hours.  Invalid input(s): FREET3 Anemia work up: No results for input(s): VITAMINB12, FOLATE, FERRITIN, TIBC, IRON, RETICCTPCT in the last 72 hours. Sepsis Labs:  Recent Labs Lab 09/09/16 1326 09/09/16 1340 09/10/16 1525 09/11/16 0639 09/12/16 0541  WBC 14.9*  --  18.8* 16.9* 16.4*  LATICACIDVEN  --  1.45  --   --   --    Microbiology No results found for this or any previous visit (from the past 240 hour(s)).   Medications:   . amLODipine  5 mg Oral Daily  . aspirin EC  81 mg Oral Daily  . calcitonin  100 Units Subcutaneous Q12H  . dexamethasone  20 mg Intravenous Daily  . DULoxetine  60 mg Oral Daily  . feeding supplement (ENSURE ENLIVE)  237 mL Oral BID BM  . heparin  5,000 Units Subcutaneous Q8H  . losartan  100 mg Oral Daily  . montelukast  10 mg Oral Q breakfast  . polyethylene glycol  17 g Oral BID  . senna   1 tablet Oral BID  . simvastatin  40 mg Oral Daily   Continuous Infusions: . sodium chloride 125 mL/hr at 09/11/16 2256    Time spent: 25 min   LOS: 2 days   Charlynne Cousins  [facility] Pager [phone]  *Please refer to [email or web address], password [email or web address] to get updated schedule on who will round on this patient, as hospitalists switch teams weekly. If 7PM-7AM, please contact night-coverage at [email or web address], password [email or web address] for any overnight needs.  09/12/2016, 8:41 AM

## 2016-09-13 ENCOUNTER — Encounter: Payer: Self-pay | Admitting: *Deleted

## 2016-09-13 ENCOUNTER — Ambulatory Visit: Admit: 2016-09-13 | Payer: Medicare Other | Admitting: Dentistry

## 2016-09-13 ENCOUNTER — Ambulatory Visit
Admit: 2016-09-13 | Discharge: 2016-09-13 | Disposition: A | Discharge status: Home / Self Care | Payer: Medicare Other | Attending: Radiation Oncology | Admitting: Radiation Oncology

## 2016-09-13 ENCOUNTER — Encounter (HOSPITAL_COMMUNITY)
Admission: EM | Disposition: A | Discharge status: Home / Self Care | Payer: Self-pay | Source: Home / Self Care | Attending: Internal Medicine

## 2016-09-13 ENCOUNTER — Inpatient Hospital Stay (HOSPITAL_COMMUNITY): Payer: Medicare Other

## 2016-09-13 DIAGNOSIS — C760 Malignant neoplasm of head, face and neck: Secondary | ICD-10-CM

## 2016-09-13 DIAGNOSIS — I517 Cardiomegaly: Secondary | ICD-10-CM

## 2016-09-13 DIAGNOSIS — R296 Repeated falls: Secondary | ICD-10-CM

## 2016-09-13 DIAGNOSIS — C7989 Secondary malignant neoplasm of other specified sites: Secondary | ICD-10-CM

## 2016-09-13 LAB — ECHOCARDIOGRAM COMPLETE
E decel time: 444 msec
EERAT: 4.98
FS: 28 % (ref 28–44)
HEIGHTINCHES: 69 in
IVS/LV PW RATIO, ED: 0.99
LA ID, A-P, ES: 38 mm
LA diam end sys: 38 mm
LADIAMINDEX: 1.88 cm/m2
LAVOLA4C: 47.3 mL
LDCA: 3.46 cm2
LV E/e'average: 4.98
LV TDI E'LATERAL: 9.36
LV e' LATERAL: 9.36 cm/s
LVEEMED: 4.98
LVOT VTI: 28 cm
LVOT peak grad rest: 6 mmHg
LVOT peak vel: 120 cm/s
LVOTD: 21 mm
LVOTSV: 97 mL
MV Dec: 444
MV pk E vel: 46.6 m/s
MVPKAVEL: 96 m/s
PW: 14.7 mm — AB (ref 0.6–1.1)
RV LATERAL S' VELOCITY: 14.1 cm/s
TAPSE: 25.6 mm
TDI e' medial: 5.33
WEIGHTICAEL: 3044.11 [oz_av]

## 2016-09-13 LAB — COMPREHENSIVE METABOLIC PANEL
ALK PHOS: 75 U/L (ref 38–126)
ALT: 28 U/L (ref 14–54)
ANION GAP: 8 (ref 5–15)
AST: 28 U/L (ref 15–41)
Albumin: 3 g/dL — ABNORMAL LOW (ref 3.5–5.0)
BILIRUBIN TOTAL: 0.6 mg/dL (ref 0.3–1.2)
BUN: 20 mg/dL (ref 6–20)
CALCIUM: 10.8 mg/dL — AB (ref 8.9–10.3)
CO2: 23 mmol/L (ref 22–32)
Chloride: 101 mmol/L (ref 101–111)
Creatinine, Ser: 1.06 mg/dL — ABNORMAL HIGH (ref 0.44–1.00)
GFR, EST AFRICAN AMERICAN: 59 mL/min — AB (ref >60)
GFR, EST NON AFRICAN AMERICAN: 51 mL/min — AB (ref >60)
Glucose, Bld: 102 mg/dL — ABNORMAL HIGH (ref 65–99)
Potassium: 4 mmol/L (ref 3.5–5.1)
Sodium: 132 mmol/L — ABNORMAL LOW (ref 135–145)
TOTAL PROTEIN: 6.7 g/dL (ref 6.5–8.1)

## 2016-09-13 LAB — CBC
HCT: 35 % — ABNORMAL LOW (ref 36.0–46.0)
HEMOGLOBIN: 11.6 g/dL — AB (ref 12.0–15.0)
MCH: 28.9 pg (ref 26.0–34.0)
MCHC: 33.1 g/dL (ref 30.0–36.0)
MCV: 87.1 fL (ref 78.0–100.0)
PLATELETS: 311 10*3/uL (ref 150–400)
RBC: 4.02 MIL/uL (ref 3.87–5.11)
RDW: 14.3 % (ref 11.5–15.5)
WBC: 18.8 10*3/uL — ABNORMAL HIGH (ref 4.0–10.5)

## 2016-09-13 LAB — MAGNESIUM: MAGNESIUM: 1.3 mg/dL — AB (ref 1.7–2.4)

## 2016-09-13 SURGERY — MULTIPLE EXTRACTION WITH ALVEOLOPLASTY
Anesthesia: General

## 2016-09-13 MED ORDER — CEFAZOLIN SODIUM-DEXTROSE 2-4 GM/100ML-% IV SOLN: 2.0000 g | Freq: Once | INTRAVENOUS | Status: DC

## 2016-09-13 MED ORDER — ROSUVASTATIN CALCIUM 10 MG PO TABS: 10.0000 mg | ORAL_TABLET | Freq: Every day | ORAL | Status: DC

## 2016-09-13 NOTE — Consult Note (Addendum)
Radiation Oncology         [phone] ________________________________  Initial inpatient Consultation  Name: Desiree Higgins MRN: [id number]  Date: 09/09/2016  DOB: 1945-12-10  [id number] [name], MD  No ref. 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 female who is currently inpatient due to declining status, weakness, electrolyte abnormalities, AMS, and newly diagnosed tonsil cancer which per recent CT scans is widely metastatic.  I've reviewed her staging CT images.  She is alone in her room, and somewhat confused, but denies pain - only some tenderness upon pressure of her right neck mass. She thinks she's had it for 3-4 months.  The patient has seen Dr Alvy Bimler who is recommending considering of hospice and/or palliative RT.  Patient was scheduled for dental extractions today in case of aggressive H+N radiotherapy, which is now not warranted   PAST MEDICAL HISTORY:  has a past medical history of ALLERGIC RHINITIS (09/22/2007); ANXIETY (09/22/2007); DEPRESSION (09/22/2007); HYPERLIPIDEMIA (09/22/2007); HYPERTENSION (09/22/2007); Impaired glucose tolerance (12/19/2012); OSTEOARTHRITIS, KNEE (11/21/2008); OVERACTIVE BLADDER (09/22/2007); Renal disorder; Smoker (12/26/2013); Unspecified asthma(493.90) (12/26/2013); and VITAMIN D DEFICIENCY (06/04/2010).    PAST SURGICAL HISTORY: Past Surgical History:  Procedure Laterality Date  . ABDOMINAL HYSTERECTOMY    . BREAST BIOPSY  1972  . CATARACT EXTRACTION     right  . CHOLECYSTECTOMY    . OOPHORECTOMY    . TONSILLECTOMY      FAMILY HISTORY: family history includes Cancer in her mother.  SOCIAL HISTORY:  reports that she has been smoking.  She has a 17.50 pack-year smoking history. She has never used smokeless tobacco. She reports that she does not  drink alcohol or use drugs.  ALLERGIES: Ace inhibitors  MEDICATIONS:  Current Facility-Administered Medications  Medication Dose Route Frequency Provider Last Rate Last Dose  . acetaminophen (TYLENOL) tablet 1,000 mg  1,000 mg Oral Q6H PRN Silver Huguenin Elgergawy, MD      . albuterol (PROVENTIL) (2.5 MG/3ML) 0.083% nebulizer solution 2.5 mg  2.5 mg Nebulization Q2H PRN Silver Huguenin Elgergawy, MD      . amLODipine (NORVASC) tablet 10 mg  10 mg Oral Daily Ni Gorsuch, MD   10 mg at 09/13/16 1101  . bisacodyl (DULCOLAX) suppository 10 mg  10 mg Rectal Daily PRN Albertine Patricia, MD      . calcitonin (MIACALCIN) injection 100 Units  100 Units Subcutaneous Q12H Albertine Patricia, MD   100 Units at 09/13/16 1211  . ceFAZolin (ANCEF) IVPB 2g/100 mL premix  2 g Intravenous Once [name], DDS      . DULoxetine (CYMBALTA) DR capsule 60 mg  60 mg Oral Daily Davonna Belling, MD   60 mg at 09/13/16 1100  . feeding supplement (ENSURE ENLIVE) (ENSURE ENLIVE) liquid 237 mL  237 mL Oral BID BM Silver Huguenin Elgergawy, MD   237 mL at 09/13/16 1400  . heparin injection 5,000 Units  5,000 Units Subcutaneous Q8H Albertine Patricia, MD   5,000 Units at 09/13/16 1353  . hydrALAZINE (APRESOLINE) injection 10 mg  10 mg Intravenous Q6H PRN Ni Gorsuch, MD      . magnesium citrate solution 1 Bottle  1 Bottle Oral Once PRN Albertine Patricia, MD      . montelukast (SINGULAIR) tablet 10 mg  10 mg Oral Q breakfast Davonna Belling,  MD   10 mg at 09/13/16 1101  . oxyCODONE-acetaminophen (PERCOCET/ROXICET) 5-325 MG per tablet 1 tablet  1 tablet Oral Q6H PRN Davonna Belling, MD   1 tablet at 09/13/16 1727   And  . oxyCODONE (Oxy IR/ROXICODONE) immediate release tablet 5 mg  5 mg Oral Q6H PRN Davonna Belling, MD   5 mg at 09/13/16 1727  . phenol (CHLORASEPTIC) mouth spray 1 spray  1 spray Mouth/Throat PRN Silver Huguenin Elgergawy, MD      . polyethylene glycol (MIRALAX / GLYCOLAX) packet 17 g  17 g Oral TID Charlynne Cousins, MD    17 g at 09/13/16 1102  . [START ON 09/14/2016] rosuvastatin (CRESTOR) tablet 10 mg  10 mg Oral q1800 Charlynne Cousins, MD      . senna [facility]) tablet 8.6 mg  1 tablet Oral BID Albertine Patricia, MD   8.6 mg at 09/13/16 1101    REVIEW OF SYSTEMS:  Notable for that above.   PHYSICAL EXAM:  height is 5\' 9"  (1.753 m) and weight is 190 lb 4.1 oz (86.3 kg). Her oral temperature is 97.6 F (36.4 C). Her blood pressure is 134/88 and her pulse is 86. Her respiration is 18 and oxygen saturation is 96%.   General:   in mild acute distress , alert HEENT: Head is normocephalic.  Oropharynx - no thrush Neck: Neck is notable for visible, palpable right level II mass Heart: Regular in rate and rhythm with no murmurs, rubs, or gallops. Chest: Clear to auscultation anteriorly/bilaterally, with no rhonchi, wheezes, or rales. Abdomen: Soft, nontender, nondistended, with no rigidity or guarding. Extremities: tender LE's bilaterally to palpation Lymphatics: see Neck Exam Skin: No concerning lesions. Neurologic: confusion noted, no obvious CN deficits Psychiatric: confusion noted, mild distress.  ECOG = 4  0 - Asymptomatic (Fully active, able to carry on all predisease activities without restriction)  1 - Symptomatic but completely ambulatory (Restricted in physically strenuous activity but ambulatory and able to carry out work of a light or sedentary nature. For example, light housework, office work)  2 - Symptomatic, <50% in bed during the day (Ambulatory and capable of all self care but unable to carry out any work activities. Up and about more than 50% of waking hours)  3 - Symptomatic, >50% in bed, but not bedbound (Capable of only limited self-care, confined to bed or chair 50% or more of waking hours)  4 - Bedbound (Completely disabled. Cannot carry on any self-care. Totally confined to bed or chair)  5 - Death   Eustace Pen MM, Creech RH, Tormey DC, et al. [phone]). "Toxicity and response criteria of  the [employer] [facility] [facility] [employer]". Bernie Oncol. 5 (6): 649-55   LABORATORY DATA:  Lab Results  Component Value Date   WBC 18.8 (H) 09/13/2016   HGB 11.6 (L) 09/13/2016   HCT 35.0 (L) 09/13/2016   MCV 87.1 09/13/2016   PLT 311 09/13/2016   CMP     Component Value Date/Time   NA 132 (L) 09/13/2016 0450   K 4.0 09/13/2016 0450   CL 101 09/13/2016 0450   CO2 23 09/13/2016 0450   GLUCOSE 102 (H) 09/13/2016 0450   BUN 20 09/13/2016 0450   CREATININE 1.06 (H) 09/13/2016 0450   CALCIUM 10.8 (H) 09/13/2016 0450   PROT 6.7 09/13/2016 0450   ALBUMIN 3.0 (L) 09/13/2016 0450   AST 28 09/13/2016 0450   ALT 28 09/13/2016 0450   ALKPHOS 75 09/13/2016 0450   BILITOT  0.6 09/13/2016 0450   GFRNONAA 51 (L) 09/13/2016 0450   GFRAA 59 (L) 09/13/2016 0450         RADIOGRAPHY: Dg Chest 2 View  Result Date: 08/22/2016 CLINICAL DATA:  AMS and weakness of Bilateral legs per husband x about 2 weeks.Husband stated to me he believes pt has been taking too many of her prescriptions than what is prescribed.Asthma, HTN, some day smokerSx: breast biopsy -1972 EXAM: CHEST  2 VIEW COMPARISON:  08/12/2016 FINDINGS: Cardiac silhouette is mildly enlarged. No mediastinal or hilar masses. No convincing adenopathy. There are prominent bronchovascular markings bilaterally, stable. No evidence of pneumonia or pulmonary edema. No pleural effusion or pneumothorax. Skeletal structures are intact. IMPRESSION: No active cardiopulmonary disease. Electronically Signed   By: Lajean Manes M.D.   On: 08/22/2016 13:39   Dg Lumbar Spine Complete  Result Date: 09/09/2016 CLINICAL DATA:  Patient states that she has been having falls since December 2017. Patient states that she lost her balance today but, did not fall. C/p generalized lower back pain. EXAM: LUMBAR SPINE - COMPLETE 4+ VIEW COMPARISON:  08/12/2016 FINDINGS: Moderate compression fracture of L2 is stable from the prior exam. There are no new fractures. Mild  loss of disc height noted from L1-L2 through L4-L5 with moderate loss of disc height at L5-S1. There are small endplate osteophytes most evident at L1-L2 and L2-L3. Bones are demineralized. The soft tissues are unremarkable other than stable scattered aortic and iliac artery vascular calcifications. IMPRESSION: 1. No acute fracture or acute finding. 2. Compression fracture of L2 stable from the prior study. 3. Disc degenerative changes, also stable. Electronically Signed   By: Lajean Manes M.D.   On: 09/09/2016 14:39   Ct Head Wo Contrast  Result Date: 09/09/2016 CLINICAL DATA:  Altered mental status.  History falls. EXAM: CT HEAD WITHOUT CONTRAST TECHNIQUE: Contiguous axial images were obtained from the base of the skull through the vertex without intravenous contrast. COMPARISON:  CT head 08/22/2006 FINDINGS: Brain: Generalized atrophy and chronic microvascular ischemic changes. Negative for acute infarct. Negative for hemorrhage or mass. Vascular: No hyperdense vessel or unexpected calcification. Skull: Negative Sinuses/Orbits: Negative Other: None IMPRESSION: Atrophy and chronic microvascular ischemic change. No acute intracranial abnormality. Electronically Signed   By: Franchot Gallo M.D.   On: 09/09/2016 13:13   Ct Head Wo Contrast  Result Date: 08/22/2016 CLINICAL DATA:  Three day history of weakness. EXAM: CT HEAD WITHOUT CONTRAST TECHNIQUE: Contiguous axial images were obtained from the base of the skull through the vertex without intravenous contrast. COMPARISON:  None. FINDINGS: Brain: Study is mildly degraded by patient motion. No acute infarct, hemorrhage, or mass lesion is present. Mild generalized atrophy and white matter disease is present. The ventricles are proportionate to the degree of atrophy. No significant extra-axial fluid collection is present. Vascular: Atherosclerotic calcifications are present in the cavernous internal carotid arteries and at the dural margin of the vertebral  arteries. No hyperdense vessel is present. Skull: The calvarium is intact. No significant extracranial soft tissue lesions are present. Sinuses/Orbits: A polyp or mucous retention cyst is present in the right maxillary sinus with associated mucosal thickening. The paranasal sinuses and mastoid air cells are otherwise clear. IMPRESSION: 1. Mild generalized atrophy and white matter disease likely reflects the sequela of chronic microvascular ischemia. 2. No acute intracranial abnormality. 3. Atherosclerosis. 4. Right maxillary sinus disease. Electronically Signed   By: San Morelle M.D.   On: 08/22/2016 15:33   Ct Soft Tissue Neck W Contrast  Result Date: 08/22/2016 CLINICAL DATA:  Neck pain.  History of tonsil cancer. EXAM: CT NECK WITH CONTRAST TECHNIQUE: Multidetector CT imaging of the neck was performed using the standard protocol following the bolus administration of intravenous contrast. CONTRAST:  31mL ISOVUE-300 IOPAMIDOL (ISOVUE-300) INJECTION 61% COMPARISON:  None. FINDINGS: Pharynx and larynx: No focal mucosal or submucosal lesions are present. The nasopharynx, oropharynx, and hypopharynx are clear. There is some fullness at the level of the tonsils, left greater than right. Vocal cords are midline and symmetric. Salivary glands: Enhancing nodules are present at the inferior aspect of the parotid glands bilaterally. The upper parotid glands are within normal limits. The submandibular glands are normal bilaterally. Thyroid: Negative Lymph nodes: A right lateral peripherally enhancing nodal mass that measures 2.2 x 1.8 x 2.5 cm. Just inferior and anterior is a 1.5 cm peripherally enhancing lesion. The areas of the enhancement extending to the right parotid gland. There are several smaller rounded right level 2 lymph nodes. The largest measures 13 mm. A peripherally enhancing right level 3 lymph node posteriorly measures 10 mm. A rounded right level 3 lymph node measures 10 mm. Smaller posterior  right level 3 and level 4 lymph nodes are present. Multiple suspicious left-sided lymph nodes are present as well. A peripherally enhancing node or 2 nodes are present along the medial aspect of the inferior left parotid. A rounded left level 2 lymph node measures 12 mm. An ovoid posterior left level 3 lymph node measures 15 mm. A left level 4 lymph node measures 12 mm. Right peripherally enhancing left supraclavicular node measures 2.01.7 cm on image 75 of series 6. There are 2 posterior triangle lymph nodes on the left measuring 15 and 19 mm respectively. Vascular: Atherosclerotic calcifications are present at the carotid bifurcations bilaterally. There is potential stenosis bilaterally. Limited intracranial: Limited imaging the brain is unremarkable. Atherosclerotic calcifications are present. Visualized orbits: A right lens replacement is present. The globes and orbits are otherwise within normal limits. Mastoids and visualized paranasal sinuses: A large polyp or mucous retention cyst is noted inferiorly in the right maxillary sinus. There is some mucosal thickening associated. The paranasal sinuses and mastoid air cells are otherwise clear. Skeleton: Degenerative endplate changes are most severe at C5-6 and C6-7. Uncovertebral spurring results in right greater than left foraminal narrowing. Left greater than right foraminal narrowing is present at C4-5 due to facet spurring. Upper chest: Dependent atelectasis is present at the lung apices bilaterally. Atherosclerotic calcifications are present at the arch. The upper mediastinum is otherwise within normal limits. IMPRESSION: 1. Extensive bilateral cervical adenopathy concerning metastatic disease. Lymphoproliferative disease is considered less likely. 2. Mild fullness in the tonsillar area bilaterally may reflect prior treatment versus recurrent disease. 3. Bilateral atherosclerosis. 4. Right maxillary sinus disease. Electronically Signed   By: San Morelle M.D.   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 History of hysterectomy, cholecystectomy, oophorectomy. EXAM: CT CHEST, ABDOMEN, AND PELVIS WITH CONTRAST TECHNIQUE: Multidetector CT imaging of the chest, abdomen and pelvis was performed following the standard protocol during bolus administration of intravenous contrast. CONTRAST:  136mL ISOVUE-300 IOPAMIDOL (ISOVUE-300) INJECTION 61% COMPARISON:  None. FINDINGS: CT CHEST FINDINGS Partially motion degraded study. Cardiovascular: Mild cardiomegaly. No significant pericardial fluid/thickening. Left anterior descending, left circumflex and right coronary  atherosclerosis. Atherosclerotic nonaneurysmal thoracic aorta. Normal caliber pulmonary arteries. No central pulmonary emboli. Mediastinum/Nodes: No discrete thyroid nodules. Unremarkable esophagus. Mild right and mild to moderate left axillary adenopathy measuring up to 1.3 cm on the right (series 2/ image 17) and 1.7 cm on the left (series 2/image 18). Partially necrotic enlarged 2.1 cm left level 4 neck lymph node (series 2/ image 4). Mildly enlarged 1.0 cm subcarinal node (series 2/ image 28). Enlarged 1.9 cm right paratracheal node (series 2/ image 23). Enlarged 1.3 cm AP window node (series 2/ image 23). Mild bilateral hilar lymphadenopathy measuring up to 1.3 cm on the right (series 2/ image 27) and 1.2 cm on the left (series 2/image 30). Lungs/Pleura: No pneumothorax. No pleural effusion. There is patchy faintly nodular thickening of the peribronchovascular interstitium in the basilar right upper lobe, right middle lobe and less prominently in the lingula with associated peribronchovascular ground-glass attenuation and interlobular septal  thickening on the right. Parenchymal bands in the dependent lower lobe lung bases bilaterally, representing either a subsegmental atelectasis and/ or scarring. Musculoskeletal: No aggressive appearing focal osseous lesions. Mild thoracic spondylosis. CT ABDOMEN PELVIS FINDINGS Hepatobiliary: Simple 2.7 cm far lateral segment left liver lobe cyst. No additional liver lesions. Normal liver size. Cholecystectomy . Bile ducts are within normal post cholecystectomy limits with common bile duct diameter 7 mm and no radiopaque choledocholithiasis. Pancreas: Normal, with no mass or duct dilation. Spleen: Normal size spleen. Indeterminate hypodense 2.1 cm medial splenic mass (series 2/ image 52). Adrenals/Urinary Tract: No discrete adrenal nodule. Exophytic hyperdense 0.8 cm renal cortical lesion in the medial lower right kidney (series 2/ image 76). Simple 1.4 cm lateral lower left renal cyst. Additional subcentimeter hypodense renal cortical lesions in both kidneys are too small to characterize and require no further follow-up. No hydronephrosis. Bladder is collapsed by indwelling Foley catheter and appears grossly normal. Stomach/Bowel: Grossly normal stomach. Normal caliber small bowel with no small bowel wall thickening. Normal appendix. Normal large bowel with no diverticulosis, large bowel wall thickening or pericolonic fat stranding. Vascular/Lymphatic: Atherosclerotic nonaneurysmal abdominal aorta. Patent portal, splenic, hepatic and renal veins. Mildly enlarged 1.2 cm left para-aortic node (series 2/ image 82). Mild right common iliac adenopathy measuring up to 1.0 cm (series 2/ image 96). Moderate right external iliac lymphadenopathy measuring up to the 1.7 cm (series 2/ image 110). Bulky 2.7 cm right inguinal node (series 2/ image 119). Reproductive: Status post hysterectomy, with no abnormal findings at the vaginal cuff. No adnexal mass. Other: No pneumoperitoneum, ascites or focal fluid collection.  Musculoskeletal: No aggressive appearing focal osseous lesions. Mild L2 vertebral compression fracture of indeterminate chronicity, probably chronic. Moderate lumbar spondylosis. IMPRESSION: 1. Extensive nodal metastatic disease in the bilateral axilla, left supraclavicular neck, mediastinum, bilateral hilum, retroperitoneum, right pelvis and right inguinal region. 2. Spectrum of findings worrisome for lymphangitic tumor in the mid lungs, asymmetric to the right. 3. Indeterminate exophytic 0.8 cm hyperdense renal cortical lesion in the medial lower right kidney, cannot exclude primary renal neoplasm. 4. Additional findings include aortic atherosclerosis, three-vessel coronary atherosclerosis, mild cardiomegaly and probably chronic mild L2 vertebral compression fracture. Electronically Signed   By: Ilona Sorrel M.D.   On: 09/12/2016 15:50   Mr Jeri Cos And Wo Contrast  Result Date: 08/22/2016 CLINICAL DATA:  Altered mental status, recent falls. General weakness. EXAM: MRI HEAD WITHOUT AND WITH CONTRAST TECHNIQUE: Multiplanar, multiecho pulse sequences of the brain and surrounding structures were obtained without and with intravenous contrast. CONTRAST:  49mL MULTIHANCE  GADOBENATE DIMEGLUMINE 529 MG/ML IV SOLN COMPARISON:  CT neck earlier today. FINDINGS: Significant motion degradation. Study is of marginal diagnostic utility. Some type of susceptibility artifact at the skull bases, leads to artifactual distortion of much of the scan. Brain: No evidence for acute infarction, hemorrhage, mass lesion, hydrocephalus, or extra-axial fluid. Global atrophy. Chronic microvascular ischemic change. Post infusion, no abnormal enhancement of the brain or meninges. Vascular: Normal flow voids. Skull and upper cervical spine: Normal marrow signal. Sinuses/Orbits: Near complete opacification RIGHT maxillary sinus due to a polyp or retention cyst. No layering fluid. Other: Metastatic adenopathy better visualized on recent CT.  Chronic RIGHT mastoid fluid. IMPRESSION: Scan is of marginal diagnostic utility, from the standpoint of patient motion as well as some type of susceptibility artifact which obscures much of the lower slices. No visible restricted diffusion. No definite abnormal postcontrast enhancement. Atrophy and small vessel disease. Electronically Signed   By: Staci Righter M.D.   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 History of hysterectomy, cholecystectomy, oophorectomy. EXAM: CT CHEST, ABDOMEN, AND PELVIS WITH CONTRAST TECHNIQUE: Multidetector CT imaging of the chest, abdomen and pelvis was performed following the standard protocol during bolus administration of intravenous contrast. CONTRAST:  163mL ISOVUE-300 IOPAMIDOL (ISOVUE-300) INJECTION 61% COMPARISON:  None. FINDINGS: CT CHEST FINDINGS Partially motion degraded study. Cardiovascular: Mild cardiomegaly. No significant pericardial fluid/thickening. Left anterior descending, left circumflex and right coronary atherosclerosis. Atherosclerotic nonaneurysmal thoracic aorta. Normal caliber pulmonary arteries. No central pulmonary emboli. Mediastinum/Nodes: No discrete thyroid nodules. Unremarkable esophagus. Mild right and mild to moderate left axillary adenopathy measuring up to 1.3 cm on the right (series 2/ image 17) and 1.7 cm on the left (series 2/image 18). Partially necrotic enlarged 2.1 cm left level 4 neck lymph node (series 2/ image 4). Mildly enlarged 1.0 cm subcarinal node (series 2/ image 28). Enlarged 1.9 cm right paratracheal node (series 2/ image 23). Enlarged 1.3 cm AP window node (series 2/ image 23). Mild bilateral hilar lymphadenopathy measuring up to 1.3 cm on the right  (series 2/ image 27) and 1.2 cm on the left (series 2/image 30). Lungs/Pleura: No pneumothorax. No pleural effusion. There is patchy faintly nodular thickening of the peribronchovascular interstitium in the basilar right upper lobe, right middle lobe and less prominently in the lingula with associated peribronchovascular ground-glass attenuation and interlobular septal thickening on the right. Parenchymal bands in the dependent lower lobe lung bases bilaterally, representing either a subsegmental atelectasis and/ or scarring. Musculoskeletal: No aggressive appearing focal osseous lesions. Mild thoracic spondylosis. CT ABDOMEN PELVIS FINDINGS Hepatobiliary: Simple 2.7 cm far lateral segment left liver lobe cyst. No additional liver lesions. Normal liver size. Cholecystectomy . Bile ducts are within normal post cholecystectomy limits with common bile duct diameter 7 mm and no radiopaque choledocholithiasis. Pancreas: Normal, with no mass or duct dilation. Spleen: Normal size spleen. Indeterminate hypodense 2.1 cm medial splenic mass (series 2/ image 52). Adrenals/Urinary Tract: No discrete adrenal nodule. Exophytic hyperdense 0.8 cm renal cortical lesion in the medial lower right kidney (series 2/ image 76). Simple 1.4 cm lateral lower left renal cyst. Additional subcentimeter hypodense renal cortical lesions in both kidneys are too small to characterize and require no further follow-up. No hydronephrosis. Bladder is collapsed by indwelling Foley catheter and appears grossly normal. Stomach/Bowel:  Grossly normal stomach. Normal caliber small bowel with no small bowel wall thickening. Normal appendix. Normal large bowel with no diverticulosis, large bowel wall thickening or pericolonic fat stranding. Vascular/Lymphatic: Atherosclerotic nonaneurysmal abdominal aorta. Patent portal, splenic, hepatic and renal veins. Mildly enlarged 1.2 cm left para-aortic node (series 2/ image 82). Mild right common iliac adenopathy  measuring up to 1.0 cm (series 2/ image 96). Moderate right external iliac lymphadenopathy measuring up to the 1.7 cm (series 2/ image 110). Bulky 2.7 cm right inguinal node (series 2/ image 119). Reproductive: Status post hysterectomy, with no abnormal findings at the vaginal cuff. No adnexal mass. Other: No pneumoperitoneum, ascites or focal fluid collection. Musculoskeletal: No aggressive appearing focal osseous lesions. Mild L2 vertebral compression fracture of indeterminate chronicity, probably chronic. Moderate lumbar spondylosis. IMPRESSION: 1. Extensive nodal metastatic disease in the bilateral axilla, left supraclavicular neck, mediastinum, bilateral hilum, retroperitoneum, right pelvis and right inguinal region. 2. Spectrum of findings worrisome for lymphangitic tumor in the mid lungs, asymmetric to the right. 3. Indeterminate exophytic 0.8 cm hyperdense renal cortical lesion in the medial lower right kidney, cannot exclude primary renal neoplasm. 4. Additional findings include aortic atherosclerosis, three-vessel coronary atherosclerosis, mild cardiomegaly and probably chronic mild L2 vertebral compression fracture. Electronically Signed   By: Ilona Sorrel M.D.   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 I do not see a significant benefit to palliative RT for this patient.  I explained to the patient that I recommend comfort care/ hospice. She seems receptive but confused. Dr Alvy Bimler is speaking with her husband further.  I notified Dr Enrique Sack that dental extractions are not medically necessary. These will be cancelled  I believe she has <6 months to live.  She has LE tenderness bilaterally, especially on the left side.  Cannot rule out DVT.  However, given her other impending issues, I am not sure work up is necessary.  I will notify primary team and defer to them. __________________________________________   Eppie Gibson, MD

## 2016-09-13 NOTE — Progress Notes (Signed)
[facility] PROGRESS NOTE    Progress Note  Rickita Forstner  [id number] DOB: July 22, 1946 DOA: 09/09/2016 PCP: [name], MD     Brief Narrative:   Desiree Higgins is an 71 y.o. female past history of essential hypertension depression with a recent hospitalization diagnosed with tonsillar cancer comes in with altered mental status, fatigue and multiple falls in the ED basic metabolic panel show mild hypercalcemia, mild acute renal failure. She was started on IV fluid hydration and IV steroids her calcium came down and her mentation improved renal function also improved. CT scan of the chest, abdomen and pelvis showed significant progression of metastatic disease so hospice and palliative care was consulted, oncology is on board related she has not a candidate for systemic chemotherapy due to her poor condition.  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 on IV fluid hydration, steroids and calcitonin. Metabolic encephalopathy has resolved. Improvement in calcium, KVO  fluid hydration recheck a basic metabolic panel in the morning. She is not fluid overloaded on physical exam.  Acute kidney injury: Baseline creatinine <1, due to hypercalcemia. KVO  IV fluid hydration, recheck b-met in am.  Failure to thrive: Multifactorial likely due to hypercalcemia malignancy and probably hypovolemia. Physical therapy evaluation, likely need skilled nursing facility.  Poor dentition: Discuss with Dr. Idolina Primer, we will not proceed with surgical intervention due to her advance metastatic disease.  Hyponatremia: Multifactorial likely due to poor oral intake and hydrochlorothiazide.   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 Due to the diffuse metastatic disease gross change with her poor performance she  is unlikely to tolerate systemic therapy.  I did speak with patient about her goals of care she wishes to be a DNR/DNI, her husband is at bedside relates that they will have to talk to her daughter's and conference with palliative care to decide her management in the future. Consult PMT  Hyperbilirubinemia: Likely due to dehydration and decreased oral intake recheck LFTs. Now resolved.  Metabolic encephalopathy: Likely malignant hypercalcemia now improved.  Hypokalemia: Due to diuresis replacing orally along with magnesium. Now resolved.  DVT prophylaxis: lovenox Family Communication:none Disposition Plan/Barrier to D/C: Not able to discharge until we get Goals of care with her metastatic disease. 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 History of hysterectomy, cholecystectomy, oophorectomy. EXAM: CT CHEST, ABDOMEN, AND PELVIS WITH CONTRAST TECHNIQUE: Multidetector CT imaging of the chest, abdomen and pelvis was performed following the standard protocol during bolus administration of intravenous contrast. CONTRAST:  157m ISOVUE-300 IOPAMIDOL (ISOVUE-300) INJECTION 61% COMPARISON:  None. FINDINGS: CT CHEST FINDINGS Partially motion degraded study. Cardiovascular: Mild  cardiomegaly. No significant pericardial fluid/thickening. Left anterior descending, left  circumflex and right coronary atherosclerosis. Atherosclerotic nonaneurysmal thoracic aorta. Normal caliber pulmonary arteries. No central pulmonary emboli. Mediastinum/Nodes: No discrete thyroid nodules. Unremarkable esophagus. Mild right and mild to moderate left axillary adenopathy measuring up to 1.3 cm on the right (series 2/ image 17) and 1.7 cm on the left (series 2/image 18). Partially necrotic enlarged 2.1 cm left level 4 neck lymph node (series 2/ image 4). Mildly enlarged 1.0 cm subcarinal node (series 2/ image 28). Enlarged 1.9 cm right paratracheal node (series 2/ image 23). Enlarged 1.3 cm AP window node (series 2/ image 23). Mild bilateral hilar lymphadenopathy measuring up to 1.3 cm on the right (series 2/ image 27) and 1.2 cm on the left (series 2/image 30). Lungs/Pleura: No pneumothorax. No pleural effusion. There is patchy faintly nodular thickening of the peribronchovascular interstitium in the basilar right upper lobe, right middle lobe and less prominently in the lingula with associated peribronchovascular ground-glass attenuation and interlobular septal thickening on the right. Parenchymal bands in the dependent lower lobe lung bases bilaterally, representing either a subsegmental atelectasis and/ or scarring. Musculoskeletal: No aggressive appearing focal osseous lesions. Mild thoracic spondylosis. CT ABDOMEN PELVIS FINDINGS Hepatobiliary: Simple 2.7 cm far lateral segment left liver lobe cyst. No additional liver lesions. Normal liver size. Cholecystectomy . Bile ducts are within normal post cholecystectomy limits with common bile duct diameter 7 mm and no radiopaque choledocholithiasis. Pancreas: Normal, with no mass or duct dilation. Spleen: Normal size spleen. Indeterminate hypodense 2.1 cm medial splenic mass (series 2/ image 52). Adrenals/Urinary Tract: No discrete adrenal nodule. Exophytic hyperdense 0.8 cm renal cortical lesion in the medial lower right kidney (series 2/ image 76).  Simple 1.4 cm lateral lower left renal cyst. Additional subcentimeter hypodense renal cortical lesions in both kidneys are too small to characterize and require no further follow-up. No hydronephrosis. Bladder is collapsed by indwelling Foley catheter and appears grossly normal. Stomach/Bowel: Grossly normal stomach. Normal caliber small bowel with no small bowel wall thickening. Normal appendix. Normal large bowel with no diverticulosis, large bowel wall thickening or pericolonic fat stranding. Vascular/Lymphatic: Atherosclerotic nonaneurysmal abdominal aorta. Patent portal, splenic, hepatic and renal veins. Mildly enlarged 1.2 cm left para-aortic node (series 2/ image 82). Mild right common iliac adenopathy measuring up to 1.0 cm (series 2/ image 96). Moderate right external iliac lymphadenopathy measuring up to the 1.7 cm (series 2/ image 110). Bulky 2.7 cm right inguinal node (series 2/ image 119). Reproductive: Status post hysterectomy, with no abnormal findings at the vaginal cuff. No adnexal mass. Other: No pneumoperitoneum, ascites or focal fluid collection. Musculoskeletal: No aggressive appearing focal osseous lesions. Mild L2 vertebral compression fracture of indeterminate chronicity, probably chronic. Moderate lumbar spondylosis. IMPRESSION: 1. Extensive nodal metastatic disease in the bilateral axilla, left supraclavicular neck, mediastinum, bilateral hilum, retroperitoneum, right pelvis and right inguinal region. 2. Spectrum of findings worrisome for lymphangitic tumor in the mid lungs, asymmetric to the right. 3. Indeterminate exophytic 0.8 cm hyperdense renal cortical lesion in the medial lower right kidney, cannot exclude primary renal neoplasm. 4. Additional findings include aortic atherosclerosis, three-vessel coronary atherosclerosis, mild cardiomegaly and probably chronic mild L2 vertebral compression fracture. Electronically Signed   By: Ilona Sorrel M.D.   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 History of hysterectomy, cholecystectomy, oophorectomy. EXAM: CT CHEST, ABDOMEN, AND PELVIS WITH CONTRAST TECHNIQUE: Multidetector CT imaging of the chest, abdomen and pelvis was performed following the standard protocol during bolus administration of intravenous contrast. CONTRAST:  168m ISOVUE-300 IOPAMIDOL (ISOVUE-300) INJECTION 61% COMPARISON:  None. FINDINGS: CT CHEST FINDINGS Partially motion degraded study. Cardiovascular: Mild cardiomegaly. No significant pericardial fluid/thickening. Left anterior descending, left circumflex and right coronary atherosclerosis. Atherosclerotic nonaneurysmal thoracic aorta. Normal caliber pulmonary arteries. No central pulmonary emboli. Mediastinum/Nodes: No discrete thyroid nodules. Unremarkable esophagus. Mild right and mild to moderate left axillary adenopathy measuring up to 1.3 cm on the right (series 2/ image 17) and 1.7 cm on the left (series 2/image 18). Partially necrotic enlarged 2.1 cm left level 4 neck lymph node (series 2/ image 4). Mildly enlarged 1.0 cm subcarinal node (series 2/ image 28). Enlarged 1.9 cm right paratracheal node (series 2/ image 23). Enlarged 1.3 cm AP window node (series 2/ image 23). Mild bilateral hilar lymphadenopathy measuring up to 1.3 cm on the right (series 2/ image 27) and 1.2 cm on the left (series 2/image 30). Lungs/Pleura: No pneumothorax. No pleural effusion. There is patchy faintly nodular thickening of the peribronchovascular interstitium in the basilar right upper lobe, right middle lobe and less prominently in the lingula with associated peribronchovascular ground-glass attenuation and interlobular septal thickening on the right. Parenchymal  bands in the dependent lower lobe lung bases bilaterally, representing either a subsegmental atelectasis and/ or scarring. Musculoskeletal: No aggressive appearing focal osseous lesions. Mild thoracic spondylosis. CT ABDOMEN PELVIS FINDINGS Hepatobiliary: Simple 2.7 cm far lateral segment left liver lobe cyst. No additional liver lesions. Normal liver size. Cholecystectomy . Bile ducts are within normal post cholecystectomy limits with common bile duct diameter 7 mm and no radiopaque choledocholithiasis. Pancreas: Normal, with no mass or duct dilation. Spleen: Normal size spleen. Indeterminate hypodense 2.1 cm medial splenic mass (series 2/ image 52). Adrenals/Urinary Tract: No discrete adrenal nodule. Exophytic hyperdense 0.8 cm renal cortical lesion in the medial lower right kidney (series 2/ image 76). Simple 1.4 cm lateral lower left renal cyst. Additional subcentimeter hypodense renal cortical lesions in both kidneys are too small to characterize and require no further follow-up. No hydronephrosis. Bladder is collapsed by indwelling Foley catheter and appears grossly normal. Stomach/Bowel: Grossly normal stomach. Normal caliber small bowel with no small bowel wall thickening. Normal appendix. Normal large bowel with no diverticulosis, large bowel wall thickening or pericolonic fat stranding. Vascular/Lymphatic: Atherosclerotic nonaneurysmal abdominal aorta. Patent portal, splenic, hepatic and renal veins. Mildly enlarged 1.2 cm left para-aortic node (series 2/ image 82). Mild right common iliac adenopathy measuring up to 1.0 cm (series 2/ image 96). Moderate right external iliac lymphadenopathy measuring up to the 1.7 cm (series 2/ image 110). Bulky 2.7 cm right inguinal node (series 2/ image 119). Reproductive: Status post hysterectomy, with no abnormal findings at the vaginal cuff. No adnexal mass. Other: No pneumoperitoneum, ascites or focal fluid collection. Musculoskeletal: No aggressive appearing focal  osseous lesions. Mild L2 vertebral compression fracture of indeterminate chronicity, probably chronic. Moderate lumbar spondylosis. IMPRESSION: 1. Extensive nodal metastatic disease in the bilateral axilla, left supraclavicular neck, mediastinum, bilateral hilum, retroperitoneum, right pelvis and right inguinal region. 2. Spectrum of findings worrisome for lymphangitic tumor in the mid lungs, asymmetric to the right. 3. Indeterminate exophytic 0.8 cm hyperdense renal cortical lesion in the medial lower right kidney, cannot exclude primary renal neoplasm. 4. Additional findings include aortic atherosclerosis, three-vessel coronary atherosclerosis, mild  cardiomegaly and probably chronic mild L2 vertebral compression fracture. Electronically Signed   By: Ilona Sorrel M.D.   On: 09/12/2016 15:50     Medical Consultants:    None.  Anti-Infectives:   None  Subjective:    Lashon Beringer relates no new complains.  Objective:    Vitals:   09/12/16 0534 09/12/16 1357 09/12/16 2052 09/13/16 0528  BP: (!) 146/69 (!) 141/61 (!) 148/69 (!) 142/71  Pulse: 87 79 87 91  Resp: _0 Temp: 98.6 F (37 C) 97.6 F (36.4 C) 98.4 F (36.9 C) 98.4 F (36.9 C)  TempSrc: Oral Oral Oral Oral  SpO2: 96% 94% 97% 97%  Weight:      Height:        Intake/Output Summary (Last 24 hours) at 09/13/16 0950 Last data filed at 09/13/16 0606  Gross per 24 hour  Intake              720 ml  Output             1375 ml  Net             -655 ml   Filed Weights   09/10/16 1404  Weight: 86.3 kg (190 lb 4.1 oz)    Exam: General exam: In no acute distress.Neck mass. Respiratory system: Good air movement and clear to auscultation. Cardiovascular system: S1 & S2 heard, RRR. No JVD. Gastrointestinal system: Abdomen is nondistended, soft and nontender.  Extremities: No pedal edema. Skin: No rashes, lesions or ulcers Psychiatry: Judgement and insight appear normal. Mood & affect appropriate.    Data  Reviewed:    Labs: Basic Metabolic Panel:  Recent Labs Lab 09/09/16 1808 09/10/16 1525 09/10/16 1806 09/11/16 0639 09/12/16 0541 09/13/16 0450  NA 132*  --  133* 134* 134* 132*  K 5.0  --  3.1* 3.4* 3.2* 4.0  CL 94*  --  96* 97* 102 101  CO2 29  --  _1 GLUCOSE 102*  --  138* 102* 100* 102*  BUN 27*  --  24* 21* 21* 20  CREATININE 1.42* 1.39* 1.40* 1.25* 1.16* 1.06*  CALCIUM 12.9*  --  12.5* 11.7* 10.8* 10.8*   GFR Estimated Creatinine Clearance: 56.2 mL/min (by C-G formula based on SCr of 1.06 mg/dL (H)). Liver Function Tests:  Recent Labs Lab 09/09/16 1808 09/11/16 0639 09/11/16 0816 09/12/16 0541 09/13/16 0450  AST 33 _2 ALT _3 ALKPHOS 93 84 85 75 75  BILITOT 2.0* 0.7 0.8 0.6 0.6  PROT 7.4 7.0 7.4 6.8 6.7  ALBUMIN 3.3* 3.1* 3.2* 2.8* 3.0*   No results for input(s): LIPASE, AMYLASE in the last 168 hours. No results for input(s): AMMONIA in the last 168 hours. Coagulation profile No results for input(s): INR, PROTIME in the last 168 hours.  CBC:  Recent Labs Lab 09/09/16 1326 09/10/16 1525 09/11/16 0639 09/12/16 0541 09/13/16 0450  WBC 14.9* 18.8* 16.9* 16.4* 18.8*  NEUTROABS 9.8*  --   --   --   --   HGB 12.0 11.8* 11.4* 10.5* 11.6*  HCT 37.6 36.1 36.1 32.3* 35.0*  MCV 89.5 89.6 91.2 88.0 87.1  PLT 299 325 314 299 311   Cardiac Enzymes: No results for input(s): CKTOTAL, CKMB, CKMBINDEX, TROPONINI in the last 168 hours. BNP (last 3 results) No results for input(s): PROBNP in the last 8760 hours. CBG: No results for input(s): GLUCAP in the last  168 hours. D-Dimer: No results for input(s): DDIMER in the last 72 hours. Hgb A1c: No results for input(s): HGBA1C in the last 72 hours. Lipid Profile: No results for input(s): CHOL, HDL, LDLCALC, TRIG, CHOLHDL, LDLDIRECT in the last 72 hours. Thyroid function studies: No results for input(s): TSH, T4TOTAL, T3FREE, THYROIDAB in the last 72 hours.  Invalid input(s):  FREET3 Anemia work up: No results for input(s): VITAMINB12, FOLATE, FERRITIN, TIBC, IRON, RETICCTPCT in the last 72 hours. Sepsis Labs:  Recent Labs Lab 09/09/16 1340 09/10/16 1525 09/11/16 0639 09/12/16 0541 09/13/16 0450  WBC  --  18.8* 16.9* 16.4* 18.8*  LATICACIDVEN 1.45  --   --   --   --    Microbiology No results found for this or any previous visit (from the past 240 hour(s)).   Medications:   . amLODipine  10 mg Oral Daily  . calcitonin  100 Units Subcutaneous Q12H  .  ceFAZolin (ANCEF) IV  2 g Intravenous Once  . dexamethasone  20 mg Intravenous Daily  . DULoxetine  60 mg Oral Daily  . feeding supplement (ENSURE ENLIVE)  237 mL Oral BID BM  . furosemide  20 mg Oral BID  . heparin  5,000 Units Subcutaneous Q8H  . montelukast  10 mg Oral Q breakfast  . polyethylene glycol  17 g Oral TID  . senna  1 tablet Oral BID  . simvastatin  40 mg Oral Daily   Continuous Infusions:   Time spent: 25 min   LOS: 3 days   Charlynne Cousins  [facility] Pager [phone]  *Please refer to [email or web address], password [email or web address] to get updated schedule on who will round on this patient, as hospitalists switch teams weekly. If 7PM-7AM, please contact night-coverage at [email or web address], password [email or web address] for any overnight needs.  09/13/2016, 9:50 AM

## 2016-09-13 NOTE — Progress Notes (Signed)
Physical Therapy Treatment Patient Details Name: Desiree Higgins MRN: [id number] DOB: 01/04/1946 Today's Date: 09/13/2016    History of Present Illness Desiree Higgins  is a 71 y.o. female with past medical history of hypertension, hyperlipidemia, depression, CK-MB, patient presents with recent hospitalization where she was diagnosed with right tonsillar cancer. Admitted2/1/18 with falls , weakness, AMS, inability to ambulate and spouse having difficulty with caring for the patient.    PT Comments    Pt familiar to this PT from recent admission. Pt with increased weakness, unsteady, and requiring more assist this admission.  Continue to recommend 24/7 assist and SNF. Pending palliative meeting tomorrow for goals of care.   Follow Up Recommendations  SNF;Supervision/Assistance - 24 hour     Equipment Recommendations  None recommended by PT    Recommendations for Other Services       Precautions / Restrictions Precautions Precautions: Fall    Mobility  Bed Mobility Overal bed mobility: Needs Assistance Bed Mobility: Supine to Sit;Sit to Supine     Supine to sit: Min assist Sit to supine: Mod assist   General bed mobility comments: assist for scooting to EOB, assist for LEs onto bed  Transfers Overall transfer level: Needs assistance Equipment used: None Transfers: Sit to/from Stand;Stand Pivot Transfers Sit to Stand: Min assist Stand pivot transfers: Min assist       General transfer comment: multimodal cues for safe technique, pt requested bathroom so brought BSC to bed, cues for hand placement for self assist steadying, assist for weakness and steadying  Ambulation/Gait                 Stairs            Wheelchair Mobility    Modified Rankin (Stroke Patients Only)       Balance Overall balance assessment: History of Falls;Needs assistance         Standing balance support: Bilateral upper extremity supported;During functional activity Standing  balance-Leahy Scale: Poor Standing balance comment: requires UE support, easily distracted and requires external assist to steady with any challenge - rise from [facility] - unable to perform pericare as she required bil UE support with standing                    Cognition Arousal/Alertness: Awake/alert Behavior During Therapy: Impulsive Overall Cognitive Status: Impaired/Different from baseline Area of Impairment: Memory;Following commands;Safety/judgement;Awareness;Problem solving;Orientation Orientation Level: Time   Memory: Decreased short-term memory Following Commands: Follows one step commands inconsistently Safety/Judgement: Decreased awareness of safety;Decreased awareness of deficits   Problem Solving: Slow processing;Requires verbal cues General Comments: pt reports bad news today - cancer diagnosis, unable to recall date, stated she didn't want to get up and then started to initiate getting to EOB - then requested bathroom    Exercises      General Comments        Pertinent Vitals/Pain Pain Assessment: No/denies pain Pain Intervention(s): Repositioned;Monitored during session    Home Living                      Prior Function            PT Goals (current goals can now be found in the care plan section) Progress towards PT goals: Progressing toward goals    Frequency    Min 2X/week      PT Plan Current plan remains appropriate    Co-evaluation  End of Session Equipment Utilized During Treatment: Gait belt Activity Tolerance: Patient limited by fatigue Patient left: in bed;with call bell/phone within reach;with bed alarm set     Time: [id number] PT Time Calculation (min) (ACUTE ONLY): 14 min  Charges:  $Therapeutic Activity: 8-22 mins                    G Codes:      Desiree Higgins,KATHrine E 2016-10-10, 3:37 PM Carmelia Bake, PT, DPT 10/10/2016 Pager: [phone]

## 2016-09-13 NOTE — Progress Notes (Signed)
This is an addendum to my progress note from this morning. I get to meet with her husband and also spoke with her daughter, [name] over the phone. Desiree Higgins's number is [phone]. I reviewed with the patient and family members information related to her cancer diagnosis. Given extensive metastatic cancer, poor performance status and comorbidities including confusion, hypercalcemia & renal failure, I do not recommend palliative systemic treatment. I recommend consideration for palliative care consult for end-of-life care/hospice. We discussed CODE STATUS briefly. The patient is still intermittently confused. Her husband would not commit to be the healthcare medical power of attorney. [name] says she will talk to her other sister, [name] and she would be willing for medical healthcare power of attorney. I addressed all their questions and concern. I recommend palliative care consult and care management for discharge planning. I will sign off.

## 2016-09-13 NOTE — Progress Notes (Signed)
Desiree Higgins just left a message in the voicemail at the cancer center. She confirmed that she wants her mother to be DO NOT RESUSCITATE

## 2016-09-13 NOTE — Progress Notes (Signed)
09/13/2016  Patient:            Desiree Higgins Date of Birth:  [birth year] MRN:                [id number]   BP (!) 142/71 (BP Location: Left Arm)   Pulse 91   Temp 98.4 F (36.9 C) (Oral)   Resp 18   Ht 5\' 9"  (1.753 m)   Wt 190 lb 4.1 oz (86.3 kg)   SpO2 97%   BMI 28.10 kg/m   I performed a chart review and noted the extensive metastatic disease found during the CT scan this weekend.  I then had a conversation with Dr. Isidore Moos who had recently evaluated the patient this morning and had discussed plan of care including dental extractions. The patient currently wishes to defer any dental extractions at this time and will follow-up with palliative care after initial consultation and discussion of options. The operating room procedure was canceled. Hospitalist was contacted and informed of the cancellation of the operating room procedure.  [name], DDS

## 2016-09-13 NOTE — Progress Notes (Signed)
No charge note:   Palliative consult received. Meeting planned for tomorrow, 2/6 at 0930.   Mariana Kaufman, AGNP-C Palliative Medicine  Please call Palliative Medicine team phone with any questions [phone]. For individual providers please see AMION.

## 2016-09-13 NOTE — Progress Notes (Signed)
Oncology Nurse Navigator Documentation  Met with Desiree Higgins and her husband during Dr. Calton Dach discussion with them re her prognosis r/t results of recent CTs that indicated extensive metastatic disease.  They voiced understanding of Dr. Calton Dach discussion of risks/benefits of chemotherapy vs hospice.  They understand a palliative care consult has been placed after which they can make a decision.  [name] provided Dr. Alvy Bimler his dtr's phone numbers, asked that she call one of them to share information.  Gayleen Orem, RN, BSN, [place] Nurse [place] [facility] [phone]

## 2016-09-13 NOTE — Progress Notes (Signed)
  Echocardiogram 2D Echocardiogram has been performed.  Yann Biehn L Androw 09/13/2016, 9:44 AM

## 2016-09-13 NOTE — Progress Notes (Signed)
Desiree Higgins   DOB:[birth year]   [id number]    Subjective: The patient feels well. Foley catheter remained for measurement of urine output which is excellent. Her mental status has improved somewhat but she is still confused and demanded to get out of bed CT scan results are available and reviewed. She denies pain  Objective:  Vitals:   09/12/16 2052 09/13/16 0528  BP: (!) 148/69 (!) 142/71  Pulse: 87 91  Resp: 18 18  Temp: 98.4 F (36.9 C) 98.4 F (36.9 C)     Intake/Output Summary (Last 24 hours) at 09/13/16 0901 Last data filed at 09/13/16 0606  Gross per 24 hour  Intake              720 ml  Output             1375 ml  Net             -655 ml    GENERAL:alert, no distress and comfortable. She does not appear to understand or follow instructions well with poor memory recall SKIN: skin color, texture, turgor are normal, no rashes or significant lesions EYES: normal, Conjunctiva are pink and non-injected, sclera clear OROPHARYNX:no exudate, no erythema and lips, buccal mucosa, and tongue normal  NECK: She has palpable lymphadenopathy on her neck  Musculoskeletal:no cyanosis of digits and no clubbing  NEURO: alert & but not fully oriented with fluent speech, no focal motor/sensory deficits   Labs:  Lab Results  Component Value Date   WBC 18.8 (H) 09/13/2016   HGB 11.6 (L) 09/13/2016   HCT 35.0 (L) 09/13/2016   MCV 87.1 09/13/2016   PLT 311 09/13/2016   NEUTROABS 9.8 (H) 09/09/2016    Lab Results  Component Value Date   NA 132 (L) 09/13/2016   K 4.0 09/13/2016   CL 101 09/13/2016   CO2 23 09/13/2016    Studies: I reviewed the imaging study Ct Chest W Contrast  Result Date: 09/12/2016 CLINICAL DATA:  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 History of hysterectomy, cholecystectomy,  oophorectomy. EXAM: CT CHEST, ABDOMEN, AND PELVIS WITH CONTRAST TECHNIQUE: Multidetector CT imaging of the chest, abdomen and pelvis was performed following the standard protocol during bolus administration of intravenous contrast. CONTRAST:  157mL ISOVUE-300 IOPAMIDOL (ISOVUE-300) INJECTION 61% COMPARISON:  None. FINDINGS: CT CHEST FINDINGS Partially motion degraded study. Cardiovascular: Mild cardiomegaly. No significant pericardial fluid/thickening. Left anterior descending, left circumflex and right coronary atherosclerosis. Atherosclerotic nonaneurysmal thoracic aorta. Normal caliber pulmonary arteries. No central pulmonary emboli. Mediastinum/Nodes: No discrete thyroid nodules. Unremarkable esophagus. Mild right and mild to moderate left axillary adenopathy measuring up to 1.3 cm on the right (series 2/ image 17) and 1.7 cm on the left (series 2/image 18). Partially necrotic enlarged 2.1 cm left level 4 neck lymph node (series 2/ image 4). Mildly enlarged 1.0 cm subcarinal node (series 2/ image 28). Enlarged 1.9 cm right paratracheal node (series 2/ image 23). Enlarged 1.3 cm AP window node (series 2/ image 23). Mild bilateral hilar lymphadenopathy measuring up to 1.3 cm on the right (series 2/ image 27) and 1.2 cm on the left (series 2/image 30). Lungs/Pleura: No pneumothorax. No pleural effusion. There is patchy faintly nodular thickening of the peribronchovascular interstitium in the basilar right upper lobe, right middle lobe and less prominently in the lingula with associated peribronchovascular ground-glass attenuation and interlobular septal thickening on  the right. Parenchymal bands in the dependent lower lobe lung bases bilaterally, representing either a subsegmental atelectasis and/ or scarring. Musculoskeletal: No aggressive appearing focal osseous lesions. Mild thoracic spondylosis. CT ABDOMEN PELVIS FINDINGS Hepatobiliary: Simple 2.7 cm far lateral segment left liver lobe cyst. No additional liver  lesions. Normal liver size. Cholecystectomy . Bile ducts are within normal post cholecystectomy limits with common bile duct diameter 7 mm and no radiopaque choledocholithiasis. Pancreas: Normal, with no mass or duct dilation. Spleen: Normal size spleen. Indeterminate hypodense 2.1 cm medial splenic mass (series 2/ image 52). Adrenals/Urinary Tract: No discrete adrenal nodule. Exophytic hyperdense 0.8 cm renal cortical lesion in the medial lower right kidney (series 2/ image 76). Simple 1.4 cm lateral lower left renal cyst. Additional subcentimeter hypodense renal cortical lesions in both kidneys are too small to characterize and require no further follow-up. No hydronephrosis. Bladder is collapsed by indwelling Foley catheter and appears grossly normal. Stomach/Bowel: Grossly normal stomach. Normal caliber small bowel with no small bowel wall thickening. Normal appendix. Normal large bowel with no diverticulosis, large bowel wall thickening or pericolonic fat stranding. Vascular/Lymphatic: Atherosclerotic nonaneurysmal abdominal aorta. Patent portal, splenic, hepatic and renal veins. Mildly enlarged 1.2 cm left para-aortic node (series 2/ image 82). Mild right common iliac adenopathy measuring up to 1.0 cm (series 2/ image 96). Moderate right external iliac lymphadenopathy measuring up to the 1.7 cm (series 2/ image 110). Bulky 2.7 cm right inguinal node (series 2/ image 119). Reproductive: Status post hysterectomy, with no abnormal findings at the vaginal cuff. No adnexal mass. Other: No pneumoperitoneum, ascites or focal fluid collection. Musculoskeletal: No aggressive appearing focal osseous lesions. Mild L2 vertebral compression fracture of indeterminate chronicity, probably chronic. Moderate lumbar spondylosis. IMPRESSION: 1. Extensive nodal metastatic disease in the bilateral axilla, left supraclavicular neck, mediastinum, bilateral hilum, retroperitoneum, right pelvis and right inguinal region. 2. Spectrum  of findings worrisome for lymphangitic tumor in the mid lungs, asymmetric to the right. 3. Indeterminate exophytic 0.8 cm hyperdense renal cortical lesion in the medial lower right kidney, cannot exclude primary renal neoplasm. 4. Additional findings include aortic atherosclerosis, three-vessel coronary atherosclerosis, mild cardiomegaly and probably chronic mild L2 vertebral compression fracture. Electronically Signed   By: Ilona Sorrel M.D.   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 History of hysterectomy, cholecystectomy, oophorectomy. EXAM: CT CHEST, ABDOMEN, AND PELVIS WITH CONTRAST TECHNIQUE: Multidetector CT imaging of the chest, abdomen and pelvis was performed following the standard protocol during bolus administration of intravenous contrast. CONTRAST:  118mL ISOVUE-300 IOPAMIDOL (ISOVUE-300) INJECTION 61% COMPARISON:  None. FINDINGS: CT CHEST FINDINGS Partially motion degraded study. Cardiovascular: Mild cardiomegaly. No significant pericardial fluid/thickening. Left anterior descending, left circumflex and right coronary atherosclerosis. Atherosclerotic nonaneurysmal thoracic aorta. Normal caliber pulmonary arteries. No central pulmonary emboli. Mediastinum/Nodes: No discrete thyroid nodules. Unremarkable esophagus. Mild right and mild to moderate left axillary adenopathy measuring up to 1.3 cm on the right (series 2/ image 17) and 1.7 cm on the left (series 2/image 18). Partially necrotic enlarged 2.1 cm left level 4 neck lymph node (series 2/ image 4). Mildly enlarged 1.0 cm subcarinal node (series 2/ image 28). Enlarged 1.9 cm right paratracheal node (series 2/ image 23). Enlarged 1.3 cm AP window node (series 2/ image 23).  Mild bilateral hilar lymphadenopathy measuring  up to 1.3 cm on the right (series 2/ image 27) and 1.2 cm on the left (series 2/image 30). Lungs/Pleura: No pneumothorax. No pleural effusion. There is patchy faintly nodular thickening of the peribronchovascular interstitium in the basilar right upper lobe, right middle lobe and less prominently in the lingula with associated peribronchovascular ground-glass attenuation and interlobular septal thickening on the right. Parenchymal bands in the dependent lower lobe lung bases bilaterally, representing either a subsegmental atelectasis and/ or scarring. Musculoskeletal: No aggressive appearing focal osseous lesions. Mild thoracic spondylosis. CT ABDOMEN PELVIS FINDINGS Hepatobiliary: Simple 2.7 cm far lateral segment left liver lobe cyst. No additional liver lesions. Normal liver size. Cholecystectomy . Bile ducts are within normal post cholecystectomy limits with common bile duct diameter 7 mm and no radiopaque choledocholithiasis. Pancreas: Normal, with no mass or duct dilation. Spleen: Normal size spleen. Indeterminate hypodense 2.1 cm medial splenic mass (series 2/ image 52). Adrenals/Urinary Tract: No discrete adrenal nodule. Exophytic hyperdense 0.8 cm renal cortical lesion in the medial lower right kidney (series 2/ image 76). Simple 1.4 cm lateral lower left renal cyst. Additional subcentimeter hypodense renal cortical lesions in both kidneys are too small to characterize and require no further follow-up. No hydronephrosis. Bladder is collapsed by indwelling Foley catheter and appears grossly normal. Stomach/Bowel: Grossly normal stomach. Normal caliber small bowel with no small bowel wall thickening. Normal appendix. Normal large bowel with no diverticulosis, large bowel wall thickening or pericolonic fat stranding. Vascular/Lymphatic: Atherosclerotic nonaneurysmal abdominal aorta. Patent portal, splenic, hepatic and renal veins. Mildly enlarged 1.2 cm left  para-aortic node (series 2/ image 82). Mild right common iliac adenopathy measuring up to 1.0 cm (series 2/ image 96). Moderate right external iliac lymphadenopathy measuring up to the 1.7 cm (series 2/ image 110). Bulky 2.7 cm right inguinal node (series 2/ image 119). Reproductive: Status post hysterectomy, with no abnormal findings at the vaginal cuff. No adnexal mass. Other: No pneumoperitoneum, ascites or focal fluid collection. Musculoskeletal: No aggressive appearing focal osseous lesions. Mild L2 vertebral compression fracture of indeterminate chronicity, probably chronic. Moderate lumbar spondylosis. IMPRESSION: 1. Extensive nodal metastatic disease in the bilateral axilla, left supraclavicular neck, mediastinum, bilateral hilum, retroperitoneum, right pelvis and right inguinal region. 2. Spectrum of findings worrisome for lymphangitic tumor in the mid lungs, asymmetric to the right. 3. Indeterminate exophytic 0.8 cm hyperdense renal cortical lesion in the medial lower right kidney, cannot exclude primary renal neoplasm. 4. Additional findings include aortic atherosclerosis, three-vessel coronary atherosclerosis, mild cardiomegaly and probably chronic mild L2 vertebral compression fracture. Electronically Signed   By: Ilona Sorrel M.D.   On: 09/12/2016 15:50    Assessment & Plan:   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 Ideally she would benefit from PET scan as outpatient to complete staging CT chest, abdomen and pelvis on 09/12/16 unfortunately showed diffuse metastatic cancer  I tried to reach out to radiation oncologist and dental surgeon and discussed this with radiation oncologist We discussed possibility of canceling dental extraction given the fact that she has diffuse metastatic cancer and the goals of treatment has changed With her poor performance status and persistent mental confusion, she is not a candidate for systemic treatment. In this setting, I would  recommend palliative care consult for possible hospice only I spoke with the patient and she does not seem to understand the big picture. I spoke with her husband, Desiree Higgins, over the telephone and requested a face-to-face meeting with him for clarifications about goals of care  Malignant  hypercalcemia, improving with aggressive IV fluids and steroids Mildly improved since admission Continue aggressive IVF hydration plus forced diuresis with lasix I have added 3 days high dose dexamethasone, started 09/11/16, completed treatment  Acute renal failure due to dehydration and malignant hypercalcemia, resolving Continue IVF Avoid Losartan, switch to Amlodipine, lasix and hydralazine prn  Altered mental status due to malignant hypercalcemia Slightly improved The patient overall still have poor judgment  Hypokalemia Due to diuresis Replace prn  Leukocytosis Due to malignancy and steroids therapy Observe  Urinary retention and severe constipation Likely due to hypercalcemia Foley is placed. Continue aggressive laxatives  Profound weakness, recent fall, poor social circumstances Consult social worker and PT once she is better Unlikely able to tolerate outpatient therapy  Significant weight loss &dysphagia Consult nutrition and Speech therapy for formal evaluation once she is more alert  Nicotine dependency Consider nicotine patch  Discharge planning  Not ready due to multiple unresolved issues Goals of care has changed in view of diffuse metastatic cancer. I repeated the statement to the patient and her husband 3 times today indicating that she has incurable disease She does not appear that they fully understood the gravity of the situation I recommend palliative care consult and I will be willing to talk to the husband face-to-face when he gets here  Heath Lark, MD 09/13/2016  9:01 AM

## 2016-09-14 ENCOUNTER — Ambulatory Visit
Admission: RE | Admit: 2016-09-14 | Discharge: 2016-09-14 | Disposition: A | Discharge status: Home / Self Care | Payer: Medicare Other | Source: Ambulatory Visit | Attending: Radiation Oncology | Admitting: Radiation Oncology

## 2016-09-14 ENCOUNTER — Ambulatory Visit: Payer: Self-pay | Admitting: Hematology and Oncology

## 2016-09-14 ENCOUNTER — Ambulatory Visit: Payer: Medicare Other

## 2016-09-14 ENCOUNTER — Telehealth: Payer: Self-pay | Admitting: Emergency Medicine

## 2016-09-14 DIAGNOSIS — Z7189 Other specified counseling: Secondary | ICD-10-CM

## 2016-09-14 DIAGNOSIS — R530 Neoplastic (malignant) related fatigue: Secondary | ICD-10-CM

## 2016-09-14 DIAGNOSIS — Z515 Encounter for palliative care: Secondary | ICD-10-CM

## 2016-09-14 MED ORDER — OXYCODONE HCL ER 10 MG PO T12A
10.0000 mg | EXTENDED_RELEASE_TABLET | Freq: Two times a day (BID) | ORAL | Status: DC
  Administered 2016-09-14: 10 mg via ORAL
  Filled 2016-09-14: qty 1

## 2016-09-14 MED ORDER — GLYCOPYRROLATE 0.2 MG/ML IJ SOLN
0.2000 mg | INTRAMUSCULAR | Status: DC | PRN
  Filled 2016-09-14: qty 1

## 2016-09-14 MED ORDER — ACETAMINOPHEN 650 MG RE SUPP: 650.0000 mg | Freq: Four times a day (QID) | RECTAL | Status: DC | PRN

## 2016-09-14 MED ORDER — ONDANSETRON 4 MG PO TBDP: 4.0000 mg | ORAL_TABLET | Freq: Four times a day (QID) | ORAL | Status: DC | PRN

## 2016-09-14 MED ORDER — POLYVINYL ALCOHOL 1.4 % OP SOLN
1.0000 [drp] | Freq: Four times a day (QID) | OPHTHALMIC | Status: DC | PRN
  Filled 2016-09-14: qty 15

## 2016-09-14 MED ORDER — ACETAMINOPHEN 325 MG PO TABS: 650.0000 mg | ORAL_TABLET | Freq: Four times a day (QID) | ORAL | Status: DC | PRN

## 2016-09-14 MED ORDER — HALOPERIDOL LACTATE 2 MG/ML PO CONC
0.5000 mg | ORAL | Status: DC | PRN
  Filled 2016-09-14: qty 0.3

## 2016-09-14 MED ORDER — BIOTENE DRY MOUTH MT LIQD: 15.0000 mL | OROMUCOSAL | Status: DC | PRN

## 2016-09-14 MED ORDER — GLYCOPYRROLATE 1 MG PO TABS
1.0000 mg | ORAL_TABLET | ORAL | Status: DC | PRN
  Filled 2016-09-14: qty 1

## 2016-09-14 MED ORDER — DEXAMETHASONE 4 MG PO TABS
2.0000 mg | ORAL_TABLET | Freq: Two times a day (BID) | ORAL | Status: DC
  Administered 2016-09-14: 2 mg via ORAL
  Filled 2016-09-14: qty 1

## 2016-09-14 MED ORDER — HALOPERIDOL 0.5 MG PO TABS
0.5000 mg | ORAL_TABLET | ORAL | Status: DC | PRN
  Filled 2016-09-14: qty 1

## 2016-09-14 MED ORDER — ONDANSETRON HCL 4 MG/2ML IJ SOLN: 4.0000 mg | Freq: Four times a day (QID) | INTRAMUSCULAR | Status: DC | PRN

## 2016-09-14 MED ORDER — DEXAMETHASONE 2 MG PO TABS: 2.0000 mg | ORAL_TABLET | Freq: Two times a day (BID) | ORAL | 0 refills | Status: AC

## 2016-09-14 MED ORDER — DEXAMETHASONE 4 MG PO TABS: 2.0000 mg | ORAL_TABLET | Freq: Two times a day (BID) | ORAL | Status: DC

## 2016-09-14 MED ORDER — HALOPERIDOL LACTATE 5 MG/ML IJ SOLN: 0.5000 mg | INTRAMUSCULAR | Status: DC | PRN

## 2016-09-14 MED ORDER — POLYETHYLENE GLYCOL 3350 17 G PO PACK: 17.0000 g | PACK | Freq: Every day | ORAL | 0 refills | Status: AC

## 2016-09-14 NOTE — Consult Note (Signed)
Consultation Note Date: 09/14/2016   Patient Name: Desiree Higgins  DOB: 05/26/1946  MRN: [id number]  Age / Sex: 71 y.o., female  PCP: Biagio Borg, MD Referring Physician: Charlynne Cousins, MD  Reason for Consultation: Establishing goals of care and Hospice Evaluation  HPI/Patient Profile: 71 y.o. female  with past medical history of HTN, HLD, depression admitted on 09/09/2016 with increasing confusion, falls and recently diagnosed tonsilar cancer. Workup revealed malignant hypercalcemia, hyponatremia, and advanced metastatic cancer with poor prognosis. Medical and radiation oncology consulted with recommendations made for no interventions due to poor functional status. Palliative medicine consulted for GOC and Hospice eval.    Clinical Assessment and Goals of Care: Met with patient and spouse. Reviewed diagnosis and prognosis. They are understanding that patient has a terminal illness. Their goals are for patient to return home, spend time with their family (2 daughters and one son are flying in on Thursday), and have as much quality as possible for the time she has left. Desiree Higgins (husband), is concerned about getting her up the stairs to their apartment- discussed [place] transportation. [name] felt he could manage patient until children arrive on Thursday and they are also available to participate in patient's care. We discussed that patient's condition may deteriorate to a point that patient will need 24 hr care. At that point they would like residential hospice.  Patient complains of fatigue, pain and decreased appetite. Pain is controlled by current regimen but returns before next pain med is due. She will benefit from long acting opioid therapy in conjunction with steroid therapy.     NEXT OF KIN - spouse- Desiree Higgins    SUMMARY OF RECOMMENDATIONS    -Dexamethasone 80m po BID (in the morning and after lunch) for  fatigue and appetite -Oxycontin 182mpo BID (dosing based on 24 hr pain med requirement) -Percocet 1044mo q6hr prn breakthrough pain -Haldol .5mg6mhrs prn agitation/delirium -Discharge home with Hospice care  Code Status/Advance Care Planning:  DNR  Palliative Prophylaxis:   Frequent Pain Assessment  Additional Recommendations (Limitations, Scope, Preferences):  Avoid Hospitalization, Full Comfort Care and Minimize Medications  Psycho-social/Spiritual:   Desire for further Chaplaincy support:No  Additional Recommendations: Education on Hospice  Prognosis:   < 6 weeks  Discharge Planning: Home with Hospice      Primary Diagnoses: Present on Admission: . Hypercalcemia of malignancy . Asthma . CKD (chronic kidney disease) stage 3, GFR 30-59 ml/min . Dehydration . Depression . Essential hypertension . Metabolic encephalopathy   I have reviewed the medical record, interviewed the patient and family, and examined the patient. The following aspects are pertinent.  Past Medical History:  Diagnosis Date  . ALLERGIC RHINITIS 09/22/2007   Qualifier: Diagnosis of  By: Desiree Higgins ANXIETY 09/22/2007   Qualifier: Diagnosis of  By: Desiree Higgins DEPRESSION 09/22/2007   Qualifier: Diagnosis of  By: Desiree Higgins HYPERLIPIDEMIA 09/22/2007   Qualifier: Diagnosis of  By: Desiree Higgins  Hunt Higgins   . HYPERTENSION 09/22/2007   Qualifier: Diagnosis of  By: Desiree Reichmann MD, Hunt Higgins   . Impaired glucose tolerance 12/19/2012  . OSTEOARTHRITIS, KNEE 11/21/2008   Qualifier: Diagnosis of  By: Desiree Reichmann MD, [place] BLADDER 09/22/2007   Qualifier: Diagnosis of  By: Desiree Reichmann MD, Hunt Higgins   . Renal disorder   . Smoker 12/26/2013  . Unspecified asthma(493.90) 12/26/2013  . VITAMIN D DEFICIENCY 06/04/2010   Qualifier: Diagnosis of  By: Desiree Reichmann MD, Hunt Higgins    Social History   Social History  . Marital status: Married    Spouse name: N/A  . Number of children: 2  . Years of  education: N/A   Social History Main Topics  . Smoking status: Current Some Day Smoker    Packs/day: 0.50    Years: 35.00  . Smokeless tobacco: Never Used  . Alcohol use No  . Drug use: No  . Sexual activity: Not Asked   Other Topics Concern  . None   Social History Narrative   CURRENT SMOKER-0.5 PPD x 35 plus yers   ALCOHOL USE - NO   MARRIED   WORK - RECEPTIONIST   1 DAUGHTER  In [place] one daughter in [place]   Family History  Problem Relation Age of Onset  . Cancer Mother     breast   Scheduled Meds: . amLODipine  10 mg Oral Daily  .  ceFAZolin (ANCEF) IV  2 g Intravenous Once  . DULoxetine  60 mg Oral Daily  . feeding supplement (ENSURE ENLIVE)  237 mL Oral BID BM  . heparin  5,000 Units Subcutaneous Q8H  . montelukast  10 mg Oral Q breakfast  . polyethylene glycol  17 g Oral TID  . rosuvastatin  10 mg Oral q1800  . senna  1 tablet Oral BID   Continuous Infusions: PRN Meds:.acetaminophen, albuterol, bisacodyl, hydrALAZINE, magnesium citrate, oxyCODONE-acetaminophen **AND** oxyCODONE, phenol Medications Prior to Admission:  Prior to Admission medications   Medication Sig Start Date End Date Taking? Authorizing Provider  acetaminophen (TYLENOL) 500 MG tablet Take 1,000 mg by mouth every 6 (six) hours as needed for mild pain or moderate pain.   Yes Historical Provider, MD  aspirin 81 MG EC tablet Take 81 mg by mouth daily. Swallow whole.   Yes Historical Provider, MD  DULoxetine (CYMBALTA) 60 MG capsule Take 1 capsule (60 mg total) by mouth daily. 03/16/16  Yes Biagio Borg, MD  ibuprofen (ADVIL,MOTRIN) 200 MG tablet Take 400 mg by mouth every 6 (six) hours as needed for mild pain or moderate pain.   Yes Historical Provider, MD  losartan-hydrochlorothiazide (HYZAAR) 100-25 MG tablet Take 1 tablet by mouth daily. 03/16/16  Yes Biagio Borg, MD  montelukast (SINGULAIR) 10 MG tablet Take 1 tablet (10 mg total) by mouth daily with breakfast. 05/06/16  Yes  Biagio Borg, MD  oxyCODONE-acetaminophen (PERCOCET) 10-325 MG per tablet Take 1 tablet by mouth every 6 (six) hours as needed for pain.    Yes Historical Provider, MD  phenol (CHLORASEPTIC) 1.4 % LIQD Use as directed 1 spray in the mouth or throat as needed for throat irritation / pain.   Yes Historical Provider, MD  PROAIR HFA 108 [phone] Base) MCG/ACT inhaler INHALE TWO PUFFS INTO LUNGS EVERY 6 HOURS AS NEEDED FOR WHEEZING AND FOR SHORTNESS OF BREATH 08/31/16  Yes Biagio Borg, MD  simvastatin (ZOCOR) 40 MG tablet Take 1  tablet (40 mg total) by mouth daily. 03/16/16  Yes Biagio Borg, MD  amLODipine (NORVASC) 5 MG tablet Take 1 tablet (5 mg total) by mouth daily. Patient not taking: Reported on 09/09/2016 08/26/16   Donne Hazel, MD  triamcinolone (NASACORT) 55 MCG/ACT nasal inhaler Place 2 sprays into the nose daily. Patient not taking: Reported on 09/09/2016 12/19/12   Biagio Borg, MD   Allergies  Allergen Reactions  . Ace Inhibitors     REACTION: cough   Review of Systems  Constitutional: Positive for appetite change and fatigue.  Gastrointestinal: Positive for diarrhea.  Musculoskeletal: Positive for back pain.  Psychiatric/Behavioral: Positive for confusion. The patient is nervous/anxious.     Physical Exam  Constitutional: She is oriented to person, place, and time. She appears well-developed and well-nourished. No distress.  Cardiovascular: Normal rate and regular rhythm.   Pulmonary/Chest: Effort normal and breath sounds normal.  Abdominal: Soft. There is tenderness.  Genitourinary:  Genitourinary Comments: Foley in place  Neurological: She is alert and oriented to person, place, and time.  Psychiatric: She has a normal mood and affect. Her behavior is normal. Judgment and thought content normal.    Vital Signs: BP (!) 149/77 (BP Location: Left Arm)   Pulse 82   Temp 97.5 F (36.4 C) (Oral)   Resp 16   Ht '5\' 9"'  (1.753 m)   Wt 86.3 kg (190 lb 4.1 oz)   SpO2 96%   BMI 28.10  kg/m  Pain Assessment: No/denies pain     SpO2: SpO2: 96 % O2 Device:SpO2: 96 % O2 Flow Rate: .O2 Flow Rate (L/min): 2 L/min  IO: Intake/output summary:  Intake/Output Summary (Last 24 hours) at 09/14/16 1019 Last data filed at 09/14/16 0800  Gross per 24 hour  Intake                0 ml  Output             2800 ml  Net            -2800 ml    LBM: Last BM Date: 09/13/16 Baseline Weight: Weight: 86.3 kg (190 lb 4.1 oz) Most recent weight: Weight: 86.3 kg (190 lb 4.1 oz)     Palliative Assessment/Data: PPS: 40%   Flowsheet Rows   Flowsheet Row Most Recent Value  Intake Tab  Referral Department  Hospitalist  Unit at Time of Referral  Cardiac/Telemetry Unit  Palliative Care Primary Diagnosis  Cancer  Date Notified  09/12/16  Palliative Care Type  New Palliative care  Reason for referral  Clarify Goals of Care  Date of Admission  09/09/16  # of days IP prior to Palliative referral  3  Clinical Assessment  Psychosocial & Spiritual Assessment  Palliative Care Outcomes       Time Total: 80 minutes Greater than 50%  of this time was spent counseling and coordinating care related to the above assessment and plan.  Signed by: Payton Emerald, NP   Please contact Palliative Medicine Team phone at [phone] for questions and concerns.  For individual provider: See Shea Evans

## 2016-09-14 NOTE — Care Management Note (Signed)
Case Management Note  Patient Details  Name: Desiree Higgins MRN: [id number] Date of Birth: May 29, 1946  Subjective/Objective: Referral for home hospice choice-Spouse chose [facility]-rep Stacy aware of referral & d/c today.TC-[facility] referral spoke to [employer].Stacy [facility] rep aware-given her spouse Ronniesha Webb [id number] not ready for hospital bed currently-will explore that @ home. Spouse ina greement to ome visit tomorrow form [facility].Already has rw. Will d/c w/ambulance transp-confirmed address.Will have PTAR forms in shadow chart-Nsg aware.                   Action/Plan:d/c plan home w/[facility].   Expected Discharge Date:  09/14/16               Expected Discharge Plan:  Home w Hospice Care  In-House Referral:     Discharge planning Services  CM Consult  Post Acute Care Choice:    Choice offered to:  Spouse  DME Arranged:    DME Agency:     HH Arranged:  RN [place] Agency:  [facility]  Status of Service:  Completed, signed off  If discussed at [place] of Stay Meetings, dates discussed:    Additional Comments:  Dessa Phi, RN 09/14/2016, 11:05 AM

## 2016-09-14 NOTE — Progress Notes (Signed)
Notified by Juliann Pulse [facility] of family request for [facility] services at home after discharge. Chart and patient information currently under review to confirm hospice eligibility.   Spoke with patient and husband at bedside to initiate education related to hospice philosophy, services and team approach to care. Family verbalized understanding of the information provided. Per discussion, plan is for discharge to home by PTAR today.   Please send signed completed DNR form home with patient.  Patient will need prescriptions for discharge comfort medications.   DME needs discussed and family requested hospital bed with 1/2 rails, OBT and W/C.   [facility] equipment manager Jewel Ysidro Evert notified and will contact [facility] to arrange delivery to the home.  The home address has been verified and is correct in the chart;[name] is family member to be contacted to arrange time of delivery.   Patient would like to keep F/C at time of discharge.  Spoke to RN, Rip Harbour who plans to teach husband how to empty foley bag prior to discharge.  [facility] aware of the above.  Completed discharge summary will need to be faxed to [facility] at [phone] when final.  Please notify [facility] when patient is ready to leave unit at discharge-call [phone].  [facility] information and contact numbers have been given to patient and spouse during visit.    Please call with any questions.  Thank You,  Freddi Starr RN, [facility] Liaison  [phone]

## 2016-09-14 NOTE — Progress Notes (Signed)
Patients discharge instructions gone over with patient and family.  Questions answered. PTAR transported patient home

## 2016-09-14 NOTE — Progress Notes (Signed)
CSW reviewed patient's chart indicating that patient plans to return home with hospice care.   No further CSW needs identified - CSW signing off.   Raynaldo Opitz, [facility] Clinical Social Worker cell #: [phone]

## 2016-09-14 NOTE — Care Management Important Message (Signed)
Important Message  Patient Details  Name: Desiree Higgins MRN: [id number] Date of Birth: 1946/02/09   Medicare Important Message Given:  Yes    Kerin Salen 09/14/2016, 10:48 AMImportant Message  Patient Details  Name: Desiree Higgins MRN: [id number] Date of Birth: 06-Feb-1946   Medicare Important Message Given:  Yes    Kerin Salen 09/14/2016, 10:47 AM

## 2016-09-14 NOTE — Telephone Encounter (Signed)
This has been done.

## 2016-09-14 NOTE — Discharge Summary (Addendum)
Physician Discharge Summary  Desiree Higgins [id number] DOB: [birth year] DOA: 09/09/2016  PCP: [name], MD  Admit date: 09/09/2016 Discharge date: 09/14/2016  Admitted From: home Disposition:  Home  Recommendations for Outpatient Follow-up:  1. Follow up with Oncology  in 1-2 weeks 2. [facility] to follow patient as an outpatient we'll continue to titrate medications and controls symptoms.  Home Health:Yes Equipment/Devices:none  Discharge Condition:Stable CODE STATUS:DNR Diet recommendation: Heart Healthy / Carb Modified / Regular / Dysphagia   Brief/Interim Summary: 71 year old with past medical history of essential hypertension, depression with a recent hospitalization with tonsillar cancer comes in with altered mental status fatigue and multiple falls she was found to have mild hypercalcemia and acute renal failure.  Discharge Diagnoses:  Active Problems:   Depression   Essential hypertension   Asthma   CKD (chronic kidney disease) stage 3, GFR 30-59 ml/min   Dehydration   Metabolic encephalopathy   Hypercalcemia of malignancy   Head and neck cancer (HCC)   AKI (acute kidney injury) ([place])   Frequent falls  Metabolic encephalopathy due to hypercalcemia of malignancy: She was started on aggressive IV fluid hydration, IV calcitonin and steroids her metabolic encephalopathy did improve, but did not return to baseline.. Her calcium did improve, IV biphosphonate was not started due to her poor dentition and a high risk of osteonecrosis of the jaw. She will continue her dexamethasone as an outpatient.  Acute kidney injury: Baseline creatinine <1, due to hypercalcemia.  Failure to thrive: Multifactorial likely due to hypercalcemia malignancy and probably hypovolemia.  Poor dentition: Discuss with Dr. Idolina Primer, we will not proceed with surgical intervention due to her advance metastatic disease.  Hyponatremia: Multifactorial likely due to poor oral intake and  hydrochlorothiazide.   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 Due to the diffuse metastatic disease gross change with her poor performance she is unlikely to tolerate systemic therapy.  I did speak with patient about her goals of care she wishes to be a DNR/DNI. Consult PMT and the patient decided to go home with hospice.  Hyperbilirubinemia: Likely due to dehydration and decreased oral intake recheck LFTs. Now resolved.  Metabolic encephalopathy: Likely malignant hypercalcemia now improved.  Hypokalemia: Due to diuresis replacing orally along with magnesium. Now resolved.   Bilateral lower ext tenderness: Cont Ibuprofen.  Discharge Instructions  Discharge Instructions    Diet - low sodium heart healthy    Complete by:  As directed    Increase activity slowly    Complete by:  As directed      Allergies as of 09/14/2016      Reactions   Ace Inhibitors    REACTION: cough      Medication List    TAKE these medications   acetaminophen 500 MG tablet Commonly known as:  TYLENOL Take 1,000 mg by mouth every 6 (six) hours as needed for mild pain or moderate pain.   amLODipine 5 MG tablet Commonly known as:  NORVASC Take 1 tablet (5 mg total) by mouth daily.   aspirin 81 MG EC tablet Take 81 mg by mouth daily. Swallow whole.   DULoxetine 60 MG capsule Commonly known as:  CYMBALTA Take 1 capsule (60 mg total) by mouth daily.   ibuprofen 200 MG tablet Commonly known as:  ADVIL,MOTRIN Take 400 mg by mouth every 6 (six) hours as needed  for mild pain or moderate pain.   losartan-hydrochlorothiazide 100-25 MG tablet Commonly known as:  HYZAAR Take 1 tablet by mouth daily.   montelukast 10 MG tablet Commonly known as:  SINGULAIR Take 1 tablet (10 mg total) by mouth daily with breakfast.    oxyCODONE-acetaminophen 10-325 MG tablet Commonly known as:  PERCOCET Take 1 tablet by mouth every 6 (six) hours as needed for pain.   phenol 1.4 % Liqd Commonly known as:  CHLORASEPTIC Use as directed 1 spray in the mouth or throat as needed for throat irritation / pain.   polyethylene glycol packet Commonly known as:  MIRALAX / GLYCOLAX Take 17 g by mouth daily.   PROAIR HFA 108 (90 Base) MCG/ACT inhaler Generic drug:  albuterol INHALE TWO PUFFS INTO LUNGS EVERY 6 HOURS AS NEEDED FOR WHEEZING AND FOR SHORTNESS OF BREATH   simvastatin 40 MG tablet Commonly known as:  ZOCOR Take 1 tablet (40 mg total) by mouth daily.   triamcinolone 55 MCG/ACT nasal inhaler Commonly known as:  NASACORT Place 2 sprays into the nose daily.       Allergies  Allergen Reactions  . Ace Inhibitors     REACTION: cough    Consultations:  PMT   Procedures/Studies: Dg Chest 2 View  Result Date: 08/22/2016 CLINICAL DATA:  AMS and weakness of Bilateral legs per husband x about 2 weeks.Husband stated to me he believes pt has been taking too many of her prescriptions than what is prescribed.Asthma, HTN, some day smokerSx: breast biopsy -1972 EXAM: CHEST  2 VIEW COMPARISON:  08/12/2016 FINDINGS: Cardiac silhouette is mildly enlarged. No mediastinal or hilar masses. No convincing adenopathy. There are prominent bronchovascular markings bilaterally, stable. No evidence of pneumonia or pulmonary edema. No pleural effusion or pneumothorax. Skeletal structures are intact. IMPRESSION: No active cardiopulmonary disease. Electronically Signed   By: Lajean Manes M.D.   On: 08/22/2016 13:39   Dg Lumbar Spine Complete  Result Date: 09/09/2016 CLINICAL DATA:  Patient states that she has been having falls since December 2017. Patient states that she lost her balance today but, did not fall. C/p generalized lower back pain. EXAM: LUMBAR SPINE - COMPLETE 4+ VIEW COMPARISON:  08/12/2016 FINDINGS: Moderate compression  fracture of L2 is stable from the prior exam. There are no new fractures. Mild loss of disc height noted from L1-L2 through L4-L5 with moderate loss of disc height at L5-S1. There are small endplate osteophytes most evident at L1-L2 and L2-L3. Bones are demineralized. The soft tissues are unremarkable other than stable scattered aortic and iliac artery vascular calcifications. IMPRESSION: 1. No acute fracture or acute finding. 2. Compression fracture of L2 stable from the prior study. 3. Disc degenerative changes, also stable. Electronically Signed   By: Lajean Manes M.D.   On: 09/09/2016 14:39   Ct Head Wo Contrast  Result Date: 09/09/2016 CLINICAL DATA:  Altered mental status.  History falls. EXAM: CT HEAD WITHOUT CONTRAST TECHNIQUE: Contiguous axial images were obtained from the base of the skull through the vertex without intravenous contrast. COMPARISON:  CT head 08/22/2006 FINDINGS: Brain: Generalized atrophy and chronic microvascular ischemic changes. Negative for acute infarct. Negative for hemorrhage or mass. Vascular: No hyperdense vessel or unexpected calcification. Skull: Negative Sinuses/Orbits: Negative Other: None IMPRESSION: Atrophy and chronic microvascular ischemic change. No acute intracranial abnormality. Electronically Signed   By: Franchot Gallo M.D.   On: 09/09/2016 13:13   Ct Head Wo Contrast  Result Date: 08/22/2016 CLINICAL DATA:  Three day history of  weakness. EXAM: CT HEAD WITHOUT CONTRAST TECHNIQUE: Contiguous axial images were obtained from the base of the skull through the vertex without intravenous contrast. COMPARISON:  None. FINDINGS: Brain: Study is mildly degraded by patient motion. No acute infarct, hemorrhage, or mass lesion is present. Mild generalized atrophy and white matter disease is present. The ventricles are proportionate to the degree of atrophy. No significant extra-axial fluid collection is present. Vascular: Atherosclerotic calcifications are present in the  cavernous internal carotid arteries and at the dural margin of the vertebral arteries. No hyperdense vessel is present. Skull: The calvarium is intact. No significant extracranial soft tissue lesions are present. Sinuses/Orbits: A polyp or mucous retention cyst is present in the right maxillary sinus with associated mucosal thickening. The paranasal sinuses and mastoid air cells are otherwise clear. IMPRESSION: 1. Mild generalized atrophy and white matter disease likely reflects the sequela of chronic microvascular ischemia. 2. No acute intracranial abnormality. 3. Atherosclerosis. 4. Right maxillary sinus disease. Electronically Signed   By: San Morelle M.D.   On: 08/22/2016 15:33   Ct Soft Tissue Neck W Contrast  Result Date: 08/22/2016 CLINICAL DATA:  Neck pain.  History of tonsil cancer. EXAM: CT NECK WITH CONTRAST TECHNIQUE: Multidetector CT imaging of the neck was performed using the standard protocol following the bolus administration of intravenous contrast. CONTRAST:  52mL ISOVUE-300 IOPAMIDOL (ISOVUE-300) INJECTION 61% COMPARISON:  None. FINDINGS: Pharynx and larynx: No focal mucosal or submucosal lesions are present. The nasopharynx, oropharynx, and hypopharynx are clear. There is some fullness at the level of the tonsils, left greater than right. Vocal cords are midline and symmetric. Salivary glands: Enhancing nodules are present at the inferior aspect of the parotid glands bilaterally. The upper parotid glands are within normal limits. The submandibular glands are normal bilaterally. Thyroid: Negative Lymph nodes: A right lateral peripherally enhancing nodal mass that measures 2.2 x 1.8 x 2.5 cm. Just inferior and anterior is a 1.5 cm peripherally enhancing lesion. The areas of the enhancement extending to the right parotid gland. There are several smaller rounded right level 2 lymph nodes. The largest measures 13 mm. A peripherally enhancing right level 3 lymph node posteriorly measures  10 mm. A rounded right level 3 lymph node measures 10 mm. Smaller posterior right level 3 and level 4 lymph nodes are present. Multiple suspicious left-sided lymph nodes are present as well. A peripherally enhancing node or 2 nodes are present along the medial aspect of the inferior left parotid. A rounded left level 2 lymph node measures 12 mm. An ovoid posterior left level 3 lymph node measures 15 mm. A left level 4 lymph node measures 12 mm. Right peripherally enhancing left supraclavicular node measures 2.01.7 cm on image 75 of series 6. There are 2 posterior triangle lymph nodes on the left measuring 15 and 19 mm respectively. Vascular: Atherosclerotic calcifications are present at the carotid bifurcations bilaterally. There is potential stenosis bilaterally. Limited intracranial: Limited imaging the brain is unremarkable. Atherosclerotic calcifications are present. Visualized orbits: A right lens replacement is present. The globes and orbits are otherwise within normal limits. Mastoids and visualized paranasal sinuses: A large polyp or mucous retention cyst is noted inferiorly in the right maxillary sinus. There is some mucosal thickening associated. The paranasal sinuses and mastoid air cells are otherwise clear. Skeleton: Degenerative endplate changes are most severe at C5-6 and C6-7. Uncovertebral spurring results in right greater than left foraminal narrowing. Left greater than right foraminal narrowing is present at C4-5 due to facet spurring.  Upper chest: Dependent atelectasis is present at the lung apices bilaterally. Atherosclerotic calcifications are present at the arch. The upper mediastinum is otherwise within normal limits. IMPRESSION: 1. Extensive bilateral cervical adenopathy concerning metastatic disease. Lymphoproliferative disease is considered less likely. 2. Mild fullness in the tonsillar area bilaterally may reflect prior treatment versus recurrent disease. 3. Bilateral atherosclerosis. 4.  Right maxillary sinus disease. Electronically Signed   By: San Morelle M.D.   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 History of hysterectomy, cholecystectomy, oophorectomy. EXAM: CT CHEST, ABDOMEN, AND PELVIS WITH CONTRAST TECHNIQUE: Multidetector CT imaging of the chest, abdomen and pelvis was performed following the standard protocol during bolus administration of intravenous contrast. CONTRAST:  165mL ISOVUE-300 IOPAMIDOL (ISOVUE-300) INJECTION 61% COMPARISON:  None. FINDINGS: CT CHEST FINDINGS Partially motion degraded study. Cardiovascular: Mild cardiomegaly. No significant pericardial fluid/thickening. Left anterior descending, left circumflex and right coronary atherosclerosis. Atherosclerotic nonaneurysmal thoracic aorta. Normal caliber pulmonary arteries. No central pulmonary emboli. Mediastinum/Nodes: No discrete thyroid nodules. Unremarkable esophagus. Mild right and mild to moderate left axillary adenopathy measuring up to 1.3 cm on the right (series 2/ image 17) and 1.7 cm on the left (series 2/image 18). Partially necrotic enlarged 2.1 cm left level 4 neck lymph node (series 2/ image 4). Mildly enlarged 1.0 cm subcarinal node (series 2/ image 28). Enlarged 1.9 cm right paratracheal node (series 2/ image 23). Enlarged 1.3 cm AP window node (series 2/ image 23). Mild bilateral hilar lymphadenopathy measuring up to 1.3 cm on the right (series 2/ image 27) and 1.2 cm on the left (series 2/image 30). Lungs/Pleura: No pneumothorax. No pleural effusion. There is patchy faintly nodular thickening of the peribronchovascular interstitium in the basilar right upper lobe, right middle lobe and less prominently in the lingula with  associated peribronchovascular ground-glass attenuation and interlobular septal thickening on the right. Parenchymal bands in the dependent lower lobe lung bases bilaterally, representing either a subsegmental atelectasis and/ or scarring. Musculoskeletal: No aggressive appearing focal osseous lesions. Mild thoracic spondylosis. CT ABDOMEN PELVIS FINDINGS Hepatobiliary: Simple 2.7 cm far lateral segment left liver lobe cyst. No additional liver lesions. Normal liver size. Cholecystectomy . Bile ducts are within normal post cholecystectomy limits with common bile duct diameter 7 mm and no radiopaque choledocholithiasis. Pancreas: Normal, with no mass or duct dilation. Spleen: Normal size spleen. Indeterminate hypodense 2.1 cm medial splenic mass (series 2/ image 52). Adrenals/Urinary Tract: No discrete adrenal nodule. Exophytic hyperdense 0.8 cm renal cortical lesion in the medial lower right kidney (series 2/ image 76). Simple 1.4 cm lateral lower left renal cyst. Additional subcentimeter hypodense renal cortical lesions in both kidneys are too small to characterize and require no further follow-up. No hydronephrosis. Bladder is collapsed by indwelling Foley catheter and appears grossly normal. Stomach/Bowel: Grossly normal stomach. Normal caliber small bowel with no small bowel wall thickening. Normal appendix. Normal large bowel with no diverticulosis, large bowel wall thickening or pericolonic fat stranding. Vascular/Lymphatic: Atherosclerotic nonaneurysmal abdominal aorta. Patent portal, splenic, hepatic and renal veins. Mildly enlarged 1.2 cm left para-aortic node (series 2/ image 82). Mild right common iliac adenopathy measuring up to 1.0 cm (series 2/ image 96). Moderate right external iliac lymphadenopathy measuring up to the 1.7 cm (series 2/ image 110). Bulky 2.7 cm right inguinal node (series 2/  image 119). Reproductive: Status post hysterectomy, with no abnormal findings at the vaginal cuff. No adnexal  mass. Other: No pneumoperitoneum, ascites or focal fluid collection. Musculoskeletal: No aggressive appearing focal osseous lesions. Mild L2 vertebral compression fracture of indeterminate chronicity, probably chronic. Moderate lumbar spondylosis. IMPRESSION: 1. Extensive nodal metastatic disease in the bilateral axilla, left supraclavicular neck, mediastinum, bilateral hilum, retroperitoneum, right pelvis and right inguinal region. 2. Spectrum of findings worrisome for lymphangitic tumor in the mid lungs, asymmetric to the right. 3. Indeterminate exophytic 0.8 cm hyperdense renal cortical lesion in the medial lower right kidney, cannot exclude primary renal neoplasm. 4. Additional findings include aortic atherosclerosis, three-vessel coronary atherosclerosis, mild cardiomegaly and probably chronic mild L2 vertebral compression fracture. Electronically Signed   By: Ilona Sorrel M.D.   On: 09/12/2016 15:50   Mr Jeri Cos And Wo Contrast  Result Date: 08/22/2016 CLINICAL DATA:  Altered mental status, recent falls. General weakness. EXAM: MRI HEAD WITHOUT AND WITH CONTRAST TECHNIQUE: Multiplanar, multiecho pulse sequences of the brain and surrounding structures were obtained without and with intravenous contrast. CONTRAST:  52mL MULTIHANCE GADOBENATE DIMEGLUMINE 529 MG/ML IV SOLN COMPARISON:  CT neck earlier today. FINDINGS: Significant motion degradation. Study is of marginal diagnostic utility. Some type of susceptibility artifact at the skull bases, leads to artifactual distortion of much of the scan. Brain: No evidence for acute infarction, hemorrhage, mass lesion, hydrocephalus, or extra-axial fluid. Global atrophy. Chronic microvascular ischemic change. Post infusion, no abnormal enhancement of the brain or meninges. Vascular: Normal flow voids. Skull and upper cervical spine: Normal marrow signal. Sinuses/Orbits: Near complete opacification RIGHT maxillary sinus due to a polyp or retention cyst. No layering  fluid. Other: Metastatic adenopathy better visualized on recent CT. Chronic RIGHT mastoid fluid. IMPRESSION: Scan is of marginal diagnostic utility, from the standpoint of patient motion as well as some type of susceptibility artifact which obscures much of the lower slices. No visible restricted diffusion. No definite abnormal postcontrast enhancement. Atrophy and small vessel disease. Electronically Signed   By: Staci Righter M.D.   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 History of hysterectomy, cholecystectomy, oophorectomy. EXAM: CT CHEST, ABDOMEN, AND PELVIS WITH CONTRAST TECHNIQUE: Multidetector CT imaging of the chest, abdomen and pelvis was performed following the standard protocol during bolus administration of intravenous contrast. CONTRAST:  129mL ISOVUE-300 IOPAMIDOL (ISOVUE-300) INJECTION 61% COMPARISON:  None. FINDINGS: CT CHEST FINDINGS Partially motion degraded study. Cardiovascular: Mild cardiomegaly. No significant pericardial fluid/thickening. Left anterior descending, left circumflex and right coronary atherosclerosis. Atherosclerotic nonaneurysmal thoracic aorta. Normal caliber pulmonary arteries. No central pulmonary emboli. Mediastinum/Nodes: No discrete thyroid nodules. Unremarkable esophagus. Mild right and mild to moderate left axillary adenopathy measuring up to 1.3 cm on the right (series 2/ image 17) and 1.7 cm on the left (series 2/image 18). Partially necrotic enlarged 2.1 cm left level 4 neck lymph node (series 2/ image 4). Mildly enlarged 1.0 cm subcarinal node (series 2/ image 28). Enlarged 1.9 cm right paratracheal node (series 2/ image 23). Enlarged 1.3 cm AP window node (series 2/ image 23). Mild  bilateral hilar lymphadenopathy measuring up to 1.3 cm on the right (series 2/ image 27) and 1.2 cm on the left (series 2/image 30). Lungs/Pleura: No pneumothorax. No pleural effusion. There is patchy faintly nodular thickening  of the peribronchovascular interstitium in the basilar right upper lobe, right middle lobe and less prominently in the lingula with associated peribronchovascular ground-glass attenuation and interlobular septal thickening on the right. Parenchymal bands in the dependent lower lobe lung bases bilaterally, representing either a subsegmental atelectasis and/ or scarring. Musculoskeletal: No aggressive appearing focal osseous lesions. Mild thoracic spondylosis. CT ABDOMEN PELVIS FINDINGS Hepatobiliary: Simple 2.7 cm far lateral segment left liver lobe cyst. No additional liver lesions. Normal liver size. Cholecystectomy . Bile ducts are within normal post cholecystectomy limits with common bile duct diameter 7 mm and no radiopaque choledocholithiasis. Pancreas: Normal, with no mass or duct dilation. Spleen: Normal size spleen. Indeterminate hypodense 2.1 cm medial splenic mass (series 2/ image 52). Adrenals/Urinary Tract: No discrete adrenal nodule. Exophytic hyperdense 0.8 cm renal cortical lesion in the medial lower right kidney (series 2/ image 76). Simple 1.4 cm lateral lower left renal cyst. Additional subcentimeter hypodense renal cortical lesions in both kidneys are too small to characterize and require no further follow-up. No hydronephrosis. Bladder is collapsed by indwelling Foley catheter and appears grossly normal. Stomach/Bowel: Grossly normal stomach. Normal caliber small bowel with no small bowel wall thickening. Normal appendix. Normal large bowel with no diverticulosis, large bowel wall thickening or pericolonic fat stranding. Vascular/Lymphatic: Atherosclerotic nonaneurysmal abdominal aorta. Patent portal, splenic, hepatic and renal veins. Mildly enlarged 1.2 cm left  para-aortic node (series 2/ image 82). Mild right common iliac adenopathy measuring up to 1.0 cm (series 2/ image 96). Moderate right external iliac lymphadenopathy measuring up to the 1.7 cm (series 2/ image 110). Bulky 2.7 cm right inguinal node (series 2/ image 119). Reproductive: Status post hysterectomy, with no abnormal findings at the vaginal cuff. No adnexal mass. Other: No pneumoperitoneum, ascites or focal fluid collection. Musculoskeletal: No aggressive appearing focal osseous lesions. Mild L2 vertebral compression fracture of indeterminate chronicity, probably chronic. Moderate lumbar spondylosis. IMPRESSION: 1. Extensive nodal metastatic disease in the bilateral axilla, left supraclavicular neck, mediastinum, bilateral hilum, retroperitoneum, right pelvis and right inguinal region. 2. Spectrum of findings worrisome for lymphangitic tumor in the mid lungs, asymmetric to the right. 3. Indeterminate exophytic 0.8 cm hyperdense renal cortical lesion in the medial lower right kidney, cannot exclude primary renal neoplasm. 4. Additional findings include aortic atherosclerosis, three-vessel coronary atherosclerosis, mild cardiomegaly and probably chronic mild L2 vertebral compression fracture. Electronically Signed   By: Ilona Sorrel M.D.   On: 09/12/2016 15:50     Subjective: She relates she feels better.  Discharge Exam: Vitals:   09/13/16 2023 09/14/16 0620  BP: (!) 147/67 (!) 149/77  Pulse: 87 82  Resp: 18 16  Temp: 98 F (36.7 C) 97.5 F (36.4 C)   Vitals:   09/13/16 1101 09/13/16 1301 09/13/16 2023 09/14/16 0620  BP: (!) 145/74 134/88 (!) 147/67 (!) 149/77  Pulse:  86 87 82  Resp:  18 18 16   Temp:  97.6 F (36.4 C) 98 F (36.7 C) 97.5 F (36.4 C)  TempSrc:  Oral Oral Oral  SpO2:  96% 100% 96%  Weight:      Height:        General: Pt is alert, awake, not in acute distress Cardiovascular: RRR, S1/S2 +, no rubs, no gallops Respiratory: CTA bilaterally, no wheezing, no  rhonchi Abdominal: Soft, NT, ND, bowel sounds + Extremities: no edema, no cyanosis    The results of significant diagnostics from this hospitalization (including imaging, microbiology, ancillary and laboratory) are listed below for reference.     Microbiology:  No results found for this or any previous visit (from the past 240 hour(s)).   Labs: BNP (last 3 results) No results for input(s): BNP in the last 8760 hours. Basic Metabolic Panel:  Recent Labs Lab 09/09/16 1808 09/10/16 1525 09/10/16 1806 09/11/16 0639 09/12/16 0541 09/13/16 0450  NA 132*  --  133* 134* 134* 132*  K 5.0  --  3.1* 3.4* 3.2* 4.0  CL 94*  --  96* 97* 102 101  CO2 29  --  29 28 23 23   GLUCOSE 102*  --  138* 102* 100* 102*  BUN 27*  --  24* 21* 21* 20  CREATININE 1.42* 1.39* 1.40* 1.25* 1.16* 1.06*  CALCIUM 12.9*  --  12.5* 11.7* 10.8* 10.8*  MG  --   --   --   --   --  1.3*   Liver Function Tests:  Recent Labs Lab 09/09/16 1808 09/11/16 0639 09/11/16 0816 09/12/16 0541 09/13/16 0450  AST 33 18 18 15 28   ALT 14 16 18 17 28   ALKPHOS 93 84 85 75 75  BILITOT 2.0* 0.7 0.8 0.6 0.6  PROT 7.4 7.0 7.4 6.8 6.7  ALBUMIN 3.3* 3.1* 3.2* 2.8* 3.0*   No results for input(s): LIPASE, AMYLASE in the last 168 hours. No results for input(s): AMMONIA in the last 168 hours. CBC:  Recent Labs Lab 09/09/16 1326 09/10/16 1525 09/11/16 0639 09/12/16 0541 09/13/16 0450  WBC 14.9* 18.8* 16.9* 16.4* 18.8*  NEUTROABS 9.8*  --   --   --   --   HGB 12.0 11.8* 11.4* 10.5* 11.6*  HCT 37.6 36.1 36.1 32.3* 35.0*  MCV 89.5 89.6 91.2 88.0 87.1  PLT 299 325 314 299 311   Cardiac Enzymes: No results for input(s): CKTOTAL, CKMB, CKMBINDEX, TROPONINI in the last 168 hours. BNP: Invalid input(s): POCBNP CBG: No results for input(s): GLUCAP in the last 168 hours. D-Dimer No results for input(s): DDIMER in the last 72 hours. Hgb A1c No results for input(s): HGBA1C in the last 72 hours. Lipid Profile No results  for input(s): CHOL, HDL, LDLCALC, TRIG, CHOLHDL, LDLDIRECT in the last 72 hours. Thyroid function studies No results for input(s): TSH, T4TOTAL, T3FREE, THYROIDAB in the last 72 hours.  Invalid input(s): FREET3 Anemia work up No results for input(s): VITAMINB12, FOLATE, FERRITIN, TIBC, IRON, RETICCTPCT in the last 72 hours. Urinalysis    Component Value Date/Time   COLORURINE YELLOW 09/09/2016 1326   APPEARANCEUR HAZY (A) 09/09/2016 1326   LABSPEC 1.013 09/09/2016 1326   PHURINE 6.0 09/09/2016 1326   GLUCOSEU NEGATIVE 09/09/2016 1326   GLUCOSEU NEGATIVE 08/12/2016 1720   HGBUR NEGATIVE 09/09/2016 1326   BILIRUBINUR NEGATIVE 09/09/2016 1326   KETONESUR NEGATIVE 09/09/2016 1326   PROTEINUR NEGATIVE 09/09/2016 1326   UROBILINOGEN 0.2 08/12/2016 1720   NITRITE NEGATIVE 09/09/2016 1326   LEUKOCYTESUR NEGATIVE 09/09/2016 1326   Sepsis Labs Invalid input(s): PROCALCITONIN,  WBC,  LACTICIDVEN Microbiology No results found for this or any previous visit (from the past 240 hour(s)).   Time coordinating discharge: Over 30 minutes  SIGNED:   Charlynne Cousins, MD  [facility] 09/14/2016, 10:46 AM Pager   If 7PM-7AM, please contact night-coverage [email or web address] Password [email or web address]

## 2016-09-14 NOTE — Telephone Encounter (Signed)
Hospice called and patient is being discharged from the hospital and going home on Hospice. Will you be the attending of care for Hospice? Please advise thanks.

## 2016-09-15 ENCOUNTER — Ambulatory Visit: Payer: Medicare Other | Admitting: Internal Medicine

## 2016-09-21 ENCOUNTER — Ambulatory Visit: Payer: Medicare Other | Admitting: Internal Medicine

## 2016-09-26 NOTE — Telephone Encounter (Signed)
I can be the attending, but please ask the hospice physician to support her with symptomatic medications

## 2016-09-27 NOTE — Telephone Encounter (Signed)
Completed.

## 2016-10-19 ENCOUNTER — Telehealth: Payer: Self-pay | Admitting: Internal Medicine

## 2016-10-19 ENCOUNTER — Telehealth: Payer: Self-pay

## 2016-10-19 NOTE — Telephone Encounter (Signed)
On 10/19/2016 I received a death certificate from [facility] (faxed). The death certificate is for cremation. The patient is a patient of Doctor [name]. The death certificate will be taken to [facility] this am for signature.  On Oct 22, 2016 I received the death certificate back from Doctor Jenny Reichmann. I got the death certificate ready and called the funeral home to let them know the death certificate is ready for pickup. I also faxed a copy to the funeral home per the funeral home request.

## 2016-10-19 NOTE — Telephone Encounter (Signed)
Patient deceased 11/10/16 at 12:11pm at home.

## 2016-10-20 ENCOUNTER — Telehealth: Payer: Self-pay

## 2016-10-20 NOTE — Telephone Encounter (Signed)
On 11/06/2016 I received a death certificate from [facility] (original). The death certificate is for cremation. The patient is a patient of Doctor [name]. The death certificate will be taken to [facility] this pm for signature.  On 11-06-16 I received the death certificate back from Doctor [name]. I got the death certificate ready and called the funeral home to let them know the death certificate is ready for pickup. I also faxed a copy to the funeral home per the funeral home request.

## 2016-11-07 DEATH — deceased

## 2018-10-04 IMAGING — DX DG CHEST 2V
2 series · 2 of 2 positions shown · non-contrast
Comparison: None available

CLINICAL DATA: Hypertension, smoker, weight loss

EXAM:
CHEST  2 VIEW

[chest pa]
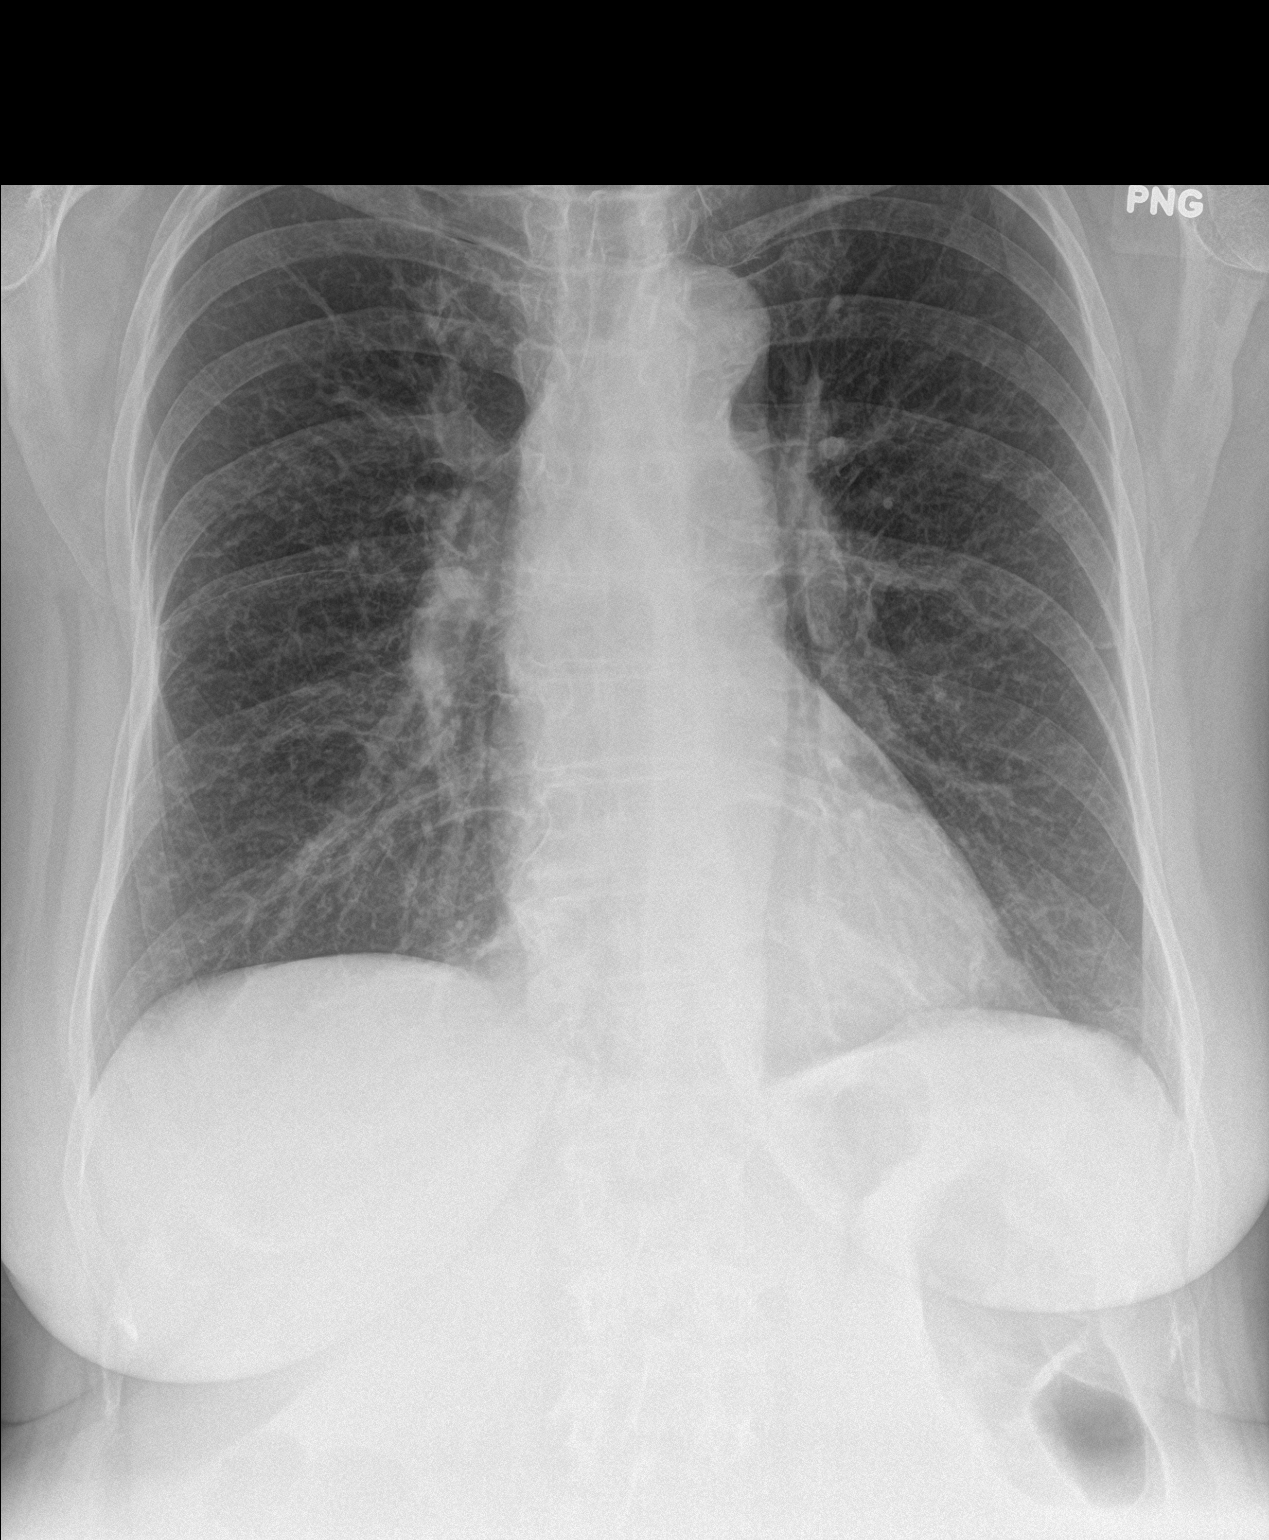

[chest lat]
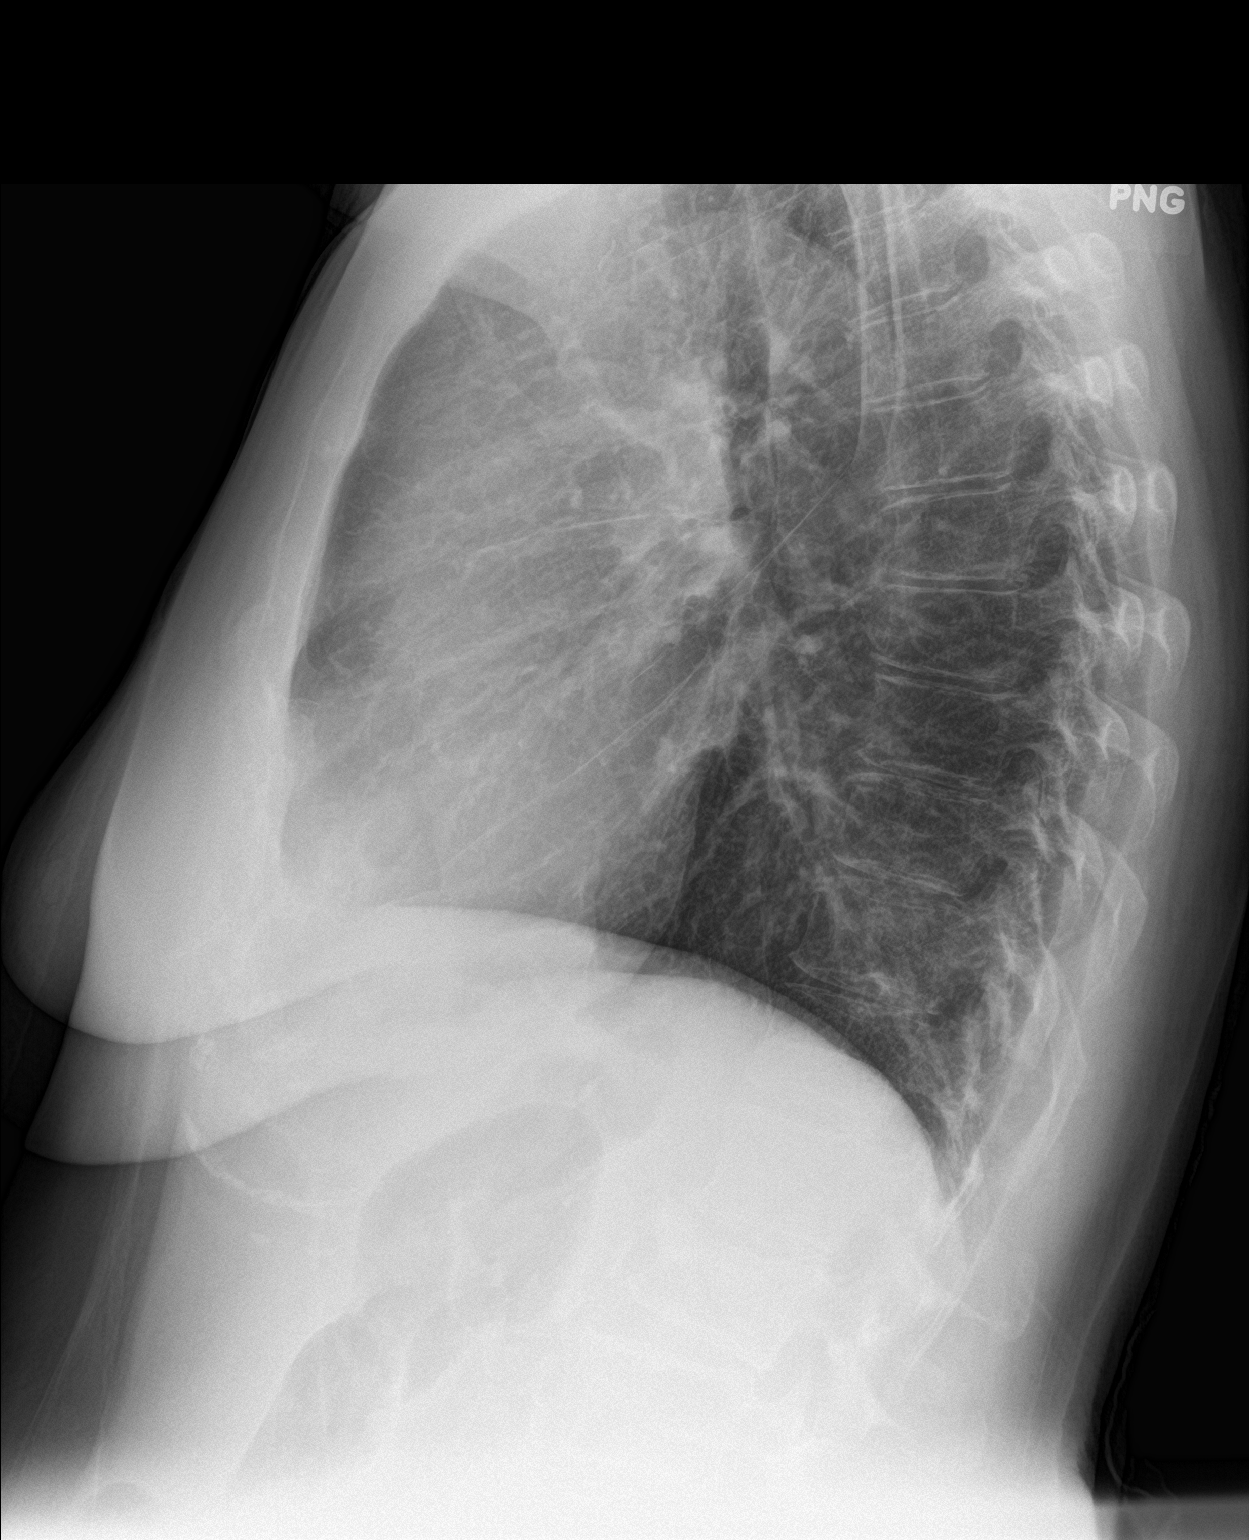

[2 of 2 positions shown; findings below may reference images not displayed]

FINDINGS: The lungs are hyperinflated compatible with background COPD/
emphysema. No focal pneumonia, collapse or consolidation. Negative
for edema, effusion or pneumothorax. Trachea is midline.
Atherosclerosis noted of the aorta. Degenerative changes of the
spine diffusely. No acute compression fracture.
IMPRESSION: Hyperinflation compatible with COPD/emphysema.

Thoracic aortic atherosclerosis

No superimposed acute process

## 2018-10-04 IMAGING — DX DG THORACIC SPINE 2V
3 series · 3 of 3 positions shown · non-contrast
Comparison: None.

CLINICAL DATA: Multiple falls over the past 6 months with
persistent back pain, initial encounter

EXAM:
THORACIC SPINE 2 VIEWS

[t-spine ap]
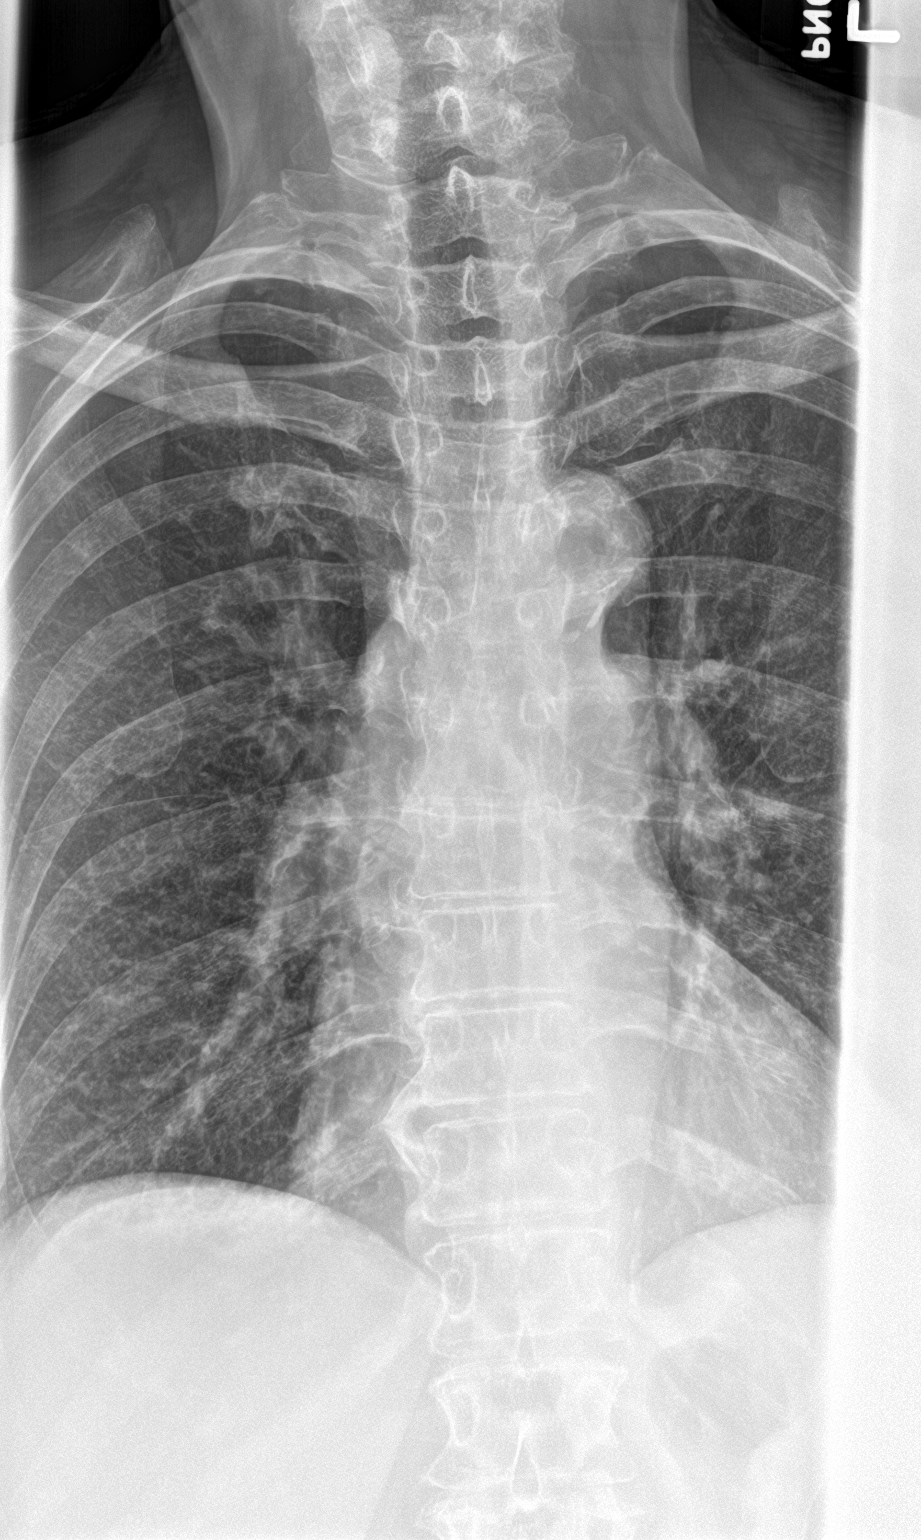

[t-spine lat]
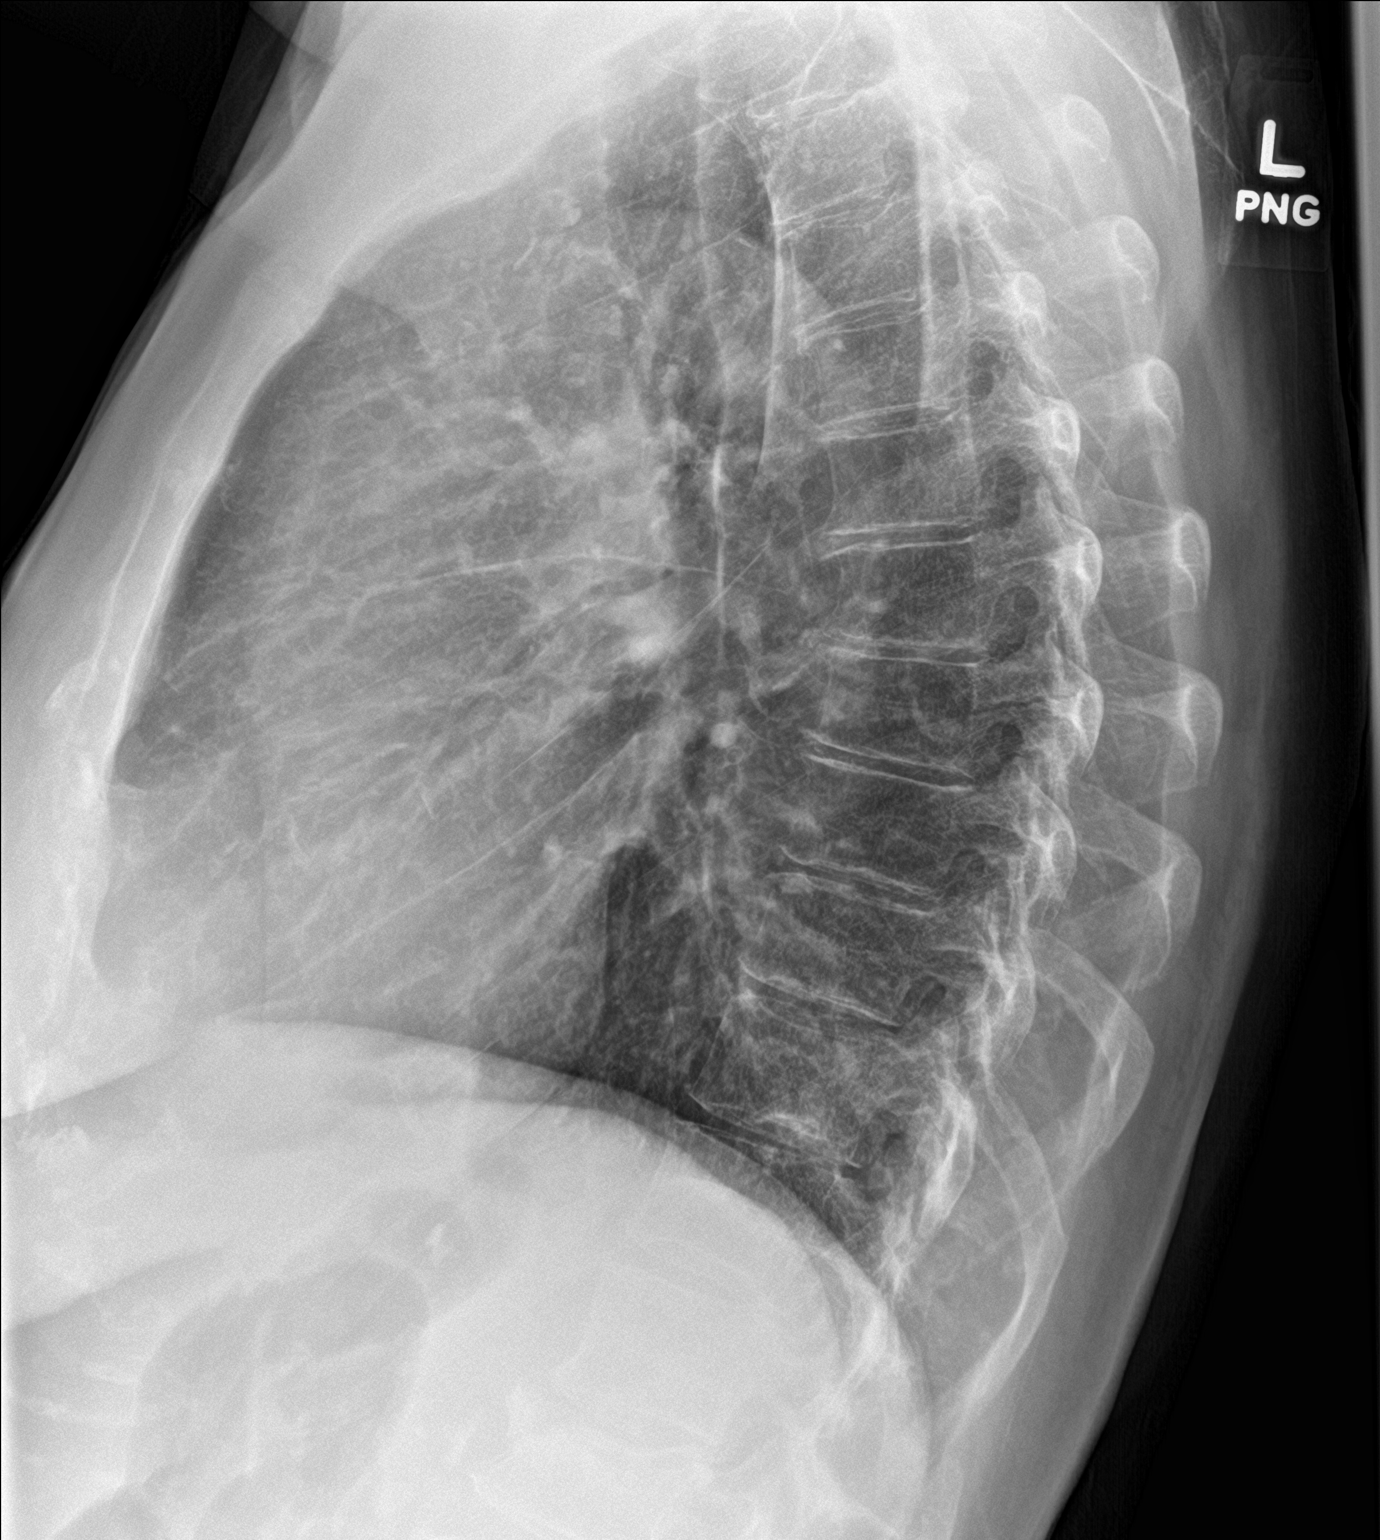

[swimmer]
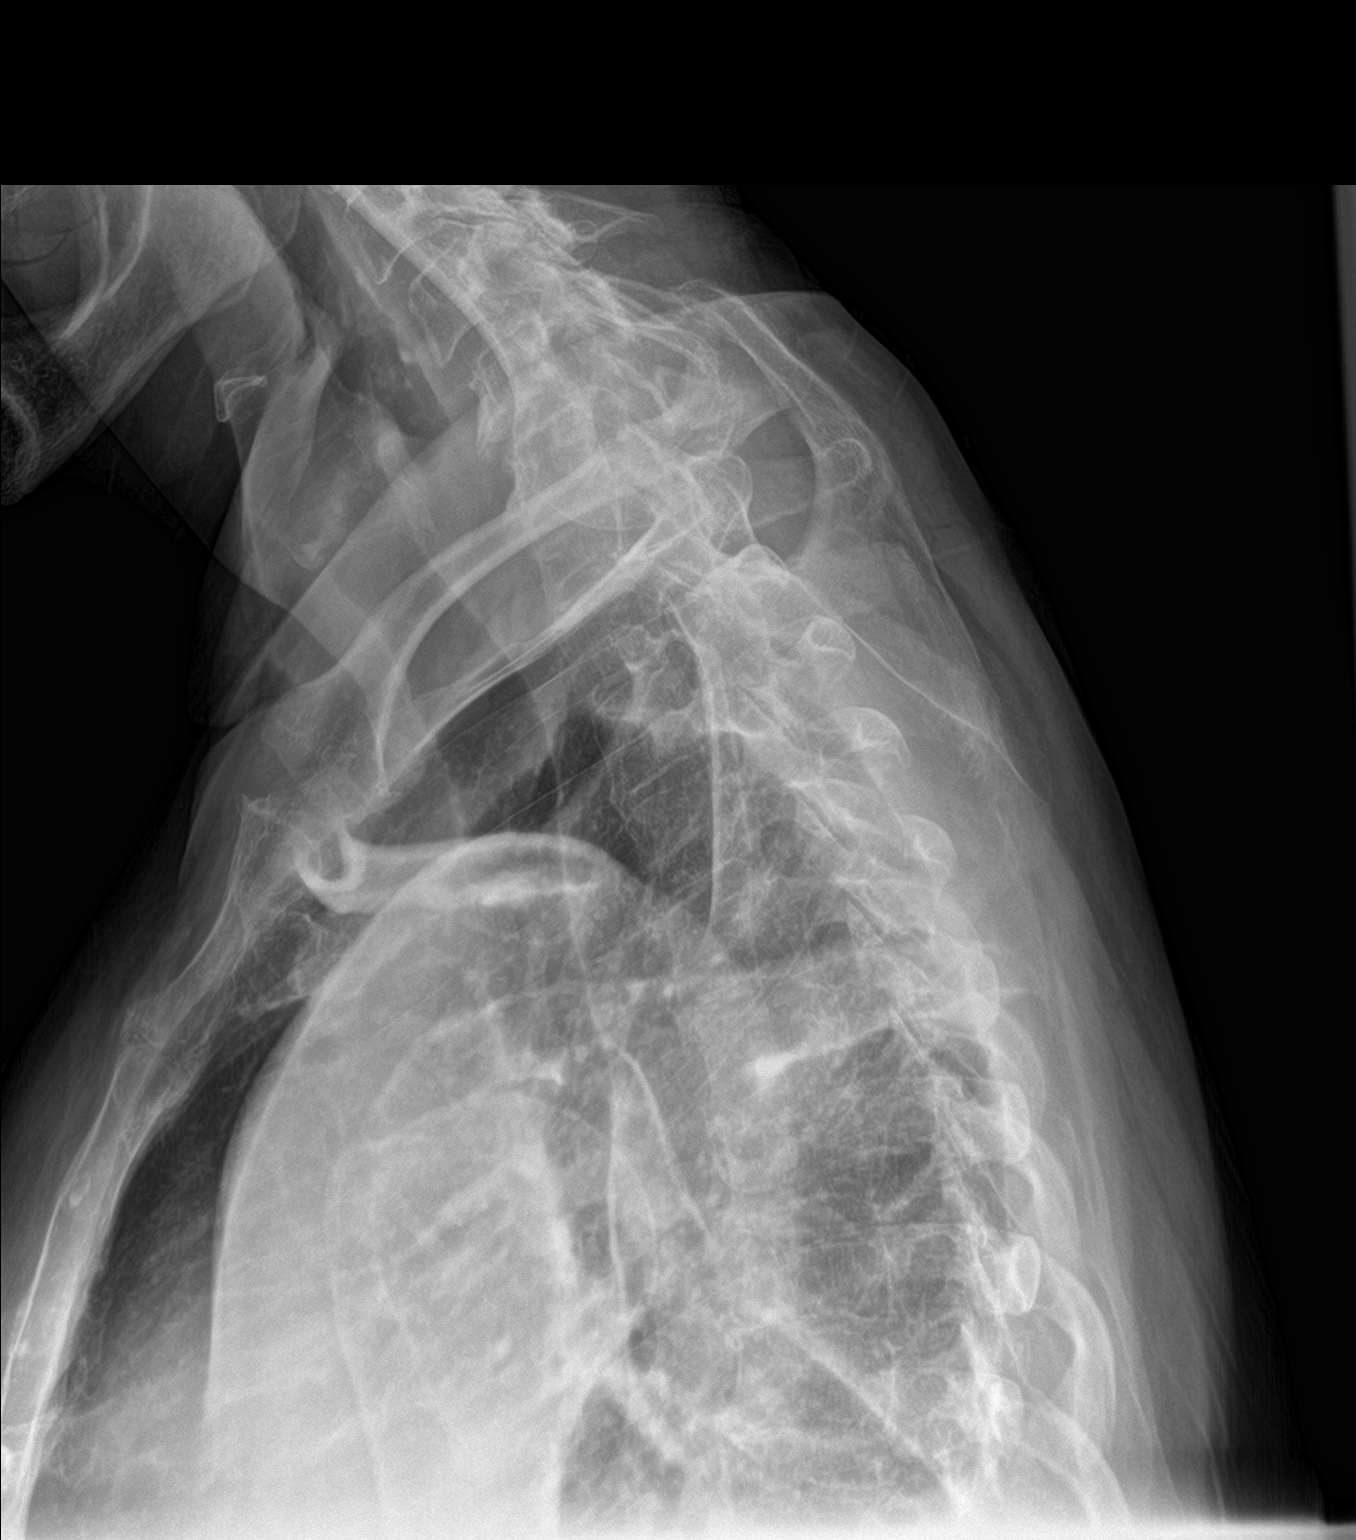

[3 of 3 positions shown; findings below may reference images not displayed]

FINDINGS: Vertebral body height is well maintained. No pedicle abnormality or
paraspinal mass lesion is seen. Mild osteophytic changes are noted.
The lungs are clear as visualized.
IMPRESSION: Mild degenerative change without acute abnormality.
